# Patient Record
Sex: Female | Born: 1935 | Race: White | Hispanic: No | Marital: Married | Smoking: Current every day smoker
Health system: Southern US, Community
[De-identification: ages and names within clinical notes are randomized; demographics above are authoritative.]

## PROBLEM LIST (undated history)

## (undated) DIAGNOSIS — I700 Atherosclerosis of aorta: Secondary | ICD-10-CM

## (undated) DIAGNOSIS — I491 Atrial premature depolarization: Secondary | ICD-10-CM

## (undated) DIAGNOSIS — I10 Essential (primary) hypertension: Secondary | ICD-10-CM

## (undated) DIAGNOSIS — R220 Localized swelling, mass and lump, head: Secondary | ICD-10-CM

## (undated) DIAGNOSIS — F17200 Nicotine dependence, unspecified, uncomplicated: Secondary | ICD-10-CM

## (undated) DIAGNOSIS — N63 Unspecified lump in unspecified breast: Secondary | ICD-10-CM

## (undated) DIAGNOSIS — R0602 Shortness of breath: Secondary | ICD-10-CM

## (undated) DIAGNOSIS — E119 Type 2 diabetes mellitus without complications: Secondary | ICD-10-CM

## (undated) DIAGNOSIS — Z9981 Dependence on supplemental oxygen: Secondary | ICD-10-CM

## (undated) DIAGNOSIS — J189 Pneumonia, unspecified organism: Secondary | ICD-10-CM

## (undated) DIAGNOSIS — M810 Age-related osteoporosis without current pathological fracture: Secondary | ICD-10-CM

## (undated) DIAGNOSIS — C4402 Squamous cell carcinoma of skin of lip: Secondary | ICD-10-CM

## (undated) DIAGNOSIS — I219 Acute myocardial infarction, unspecified: Secondary | ICD-10-CM

## (undated) DIAGNOSIS — J449 Chronic obstructive pulmonary disease, unspecified: Secondary | ICD-10-CM

## (undated) DIAGNOSIS — M1120 Other chondrocalcinosis, unspecified site: Secondary | ICD-10-CM

## (undated) DIAGNOSIS — J122 Parainfluenza virus pneumonia: Secondary | ICD-10-CM

## (undated) DIAGNOSIS — M1990 Unspecified osteoarthritis, unspecified site: Secondary | ICD-10-CM

## (undated) DIAGNOSIS — T7840XA Allergy, unspecified, initial encounter: Secondary | ICD-10-CM

## (undated) DIAGNOSIS — I2510 Atherosclerotic heart disease of native coronary artery without angina pectoris: Secondary | ICD-10-CM

## (undated) DIAGNOSIS — M47816 Spondylosis without myelopathy or radiculopathy, lumbar region: Secondary | ICD-10-CM

## (undated) DIAGNOSIS — E785 Hyperlipidemia, unspecified: Secondary | ICD-10-CM

## (undated) DIAGNOSIS — N3941 Urge incontinence: Secondary | ICD-10-CM

## (undated) HISTORY — DX: Nicotine dependence, unspecified, uncomplicated: F17.200

## (undated) HISTORY — DX: Atherosclerosis of aorta: I70.0

## (undated) HISTORY — PX: MIDDLE EAR SURGERY: SHX713

## (undated) HISTORY — DX: Parainfluenza virus pneumonia: J12.2

## (undated) HISTORY — DX: Other chondrocalcinosis, unspecified site: M11.20

## (undated) HISTORY — DX: Chronic obstructive pulmonary disease, unspecified: J44.9

## (undated) HISTORY — DX: Atrial premature depolarization: I49.1

## (undated) HISTORY — DX: Essential (primary) hypertension: I10

## (undated) HISTORY — DX: Squamous cell carcinoma of skin of lip: C44.02

## (undated) HISTORY — DX: Unspecified osteoarthritis, unspecified site: M19.90

## (undated) HISTORY — DX: Localized swelling, mass and lump, head: R22.0

## (undated) HISTORY — DX: Hyperlipidemia, unspecified: E78.5

## (undated) HISTORY — DX: Unspecified lump in unspecified breast: N63.0

## (undated) HISTORY — DX: Urge incontinence: N39.41

## (undated) HISTORY — PX: AUGMENTATION MAMMAPLASTY: SUR837

## (undated) HISTORY — DX: Atherosclerotic heart disease of native coronary artery without angina pectoris: I25.10

## (undated) HISTORY — DX: Age-related osteoporosis without current pathological fracture: M81.0

## (undated) HISTORY — DX: Type 2 diabetes mellitus without complications: E11.9

## (undated) HISTORY — DX: Spondylosis without myelopathy or radiculopathy, lumbar region: M47.816

## (undated) HISTORY — DX: Allergy, unspecified, initial encounter: T78.40XA

## (undated) HISTORY — PX: TONSILLECTOMY: SUR1361

---

## 1999-02-23 ENCOUNTER — Other Ambulatory Visit: Admission: RE | Admit: 1999-02-23 | Discharge: 1999-02-23 | Payer: Self-pay | Admitting: Family Medicine

## 1999-04-24 ENCOUNTER — Encounter: Payer: Self-pay | Admitting: Family Medicine

## 1999-04-24 ENCOUNTER — Encounter: Admission: RE | Admit: 1999-04-24 | Discharge: 1999-04-24 | Payer: Self-pay | Admitting: Family Medicine

## 2000-05-03 ENCOUNTER — Other Ambulatory Visit: Admission: RE | Admit: 2000-05-03 | Discharge: 2000-05-03 | Payer: Self-pay | Admitting: Family Medicine

## 2000-05-03 ENCOUNTER — Encounter: Payer: Self-pay | Admitting: Family Medicine

## 2000-05-03 ENCOUNTER — Encounter: Admission: RE | Admit: 2000-05-03 | Discharge: 2000-05-03 | Payer: Self-pay | Admitting: Family Medicine

## 2001-04-17 ENCOUNTER — Encounter: Admission: RE | Admit: 2001-04-17 | Discharge: 2001-04-17 | Payer: Self-pay | Admitting: Family Medicine

## 2001-04-17 ENCOUNTER — Encounter: Payer: Self-pay | Admitting: Family Medicine

## 2001-05-12 ENCOUNTER — Encounter: Payer: Self-pay | Admitting: Family Medicine

## 2001-05-12 ENCOUNTER — Encounter: Admission: RE | Admit: 2001-05-12 | Discharge: 2001-05-12 | Payer: Self-pay | Admitting: Family Medicine

## 2001-05-16 ENCOUNTER — Other Ambulatory Visit: Admission: RE | Admit: 2001-05-16 | Discharge: 2001-05-16 | Payer: Self-pay | Admitting: Family Medicine

## 2001-05-16 ENCOUNTER — Encounter: Payer: Self-pay | Admitting: Family Medicine

## 2001-05-16 ENCOUNTER — Encounter: Admission: RE | Admit: 2001-05-16 | Discharge: 2001-05-16 | Payer: Self-pay | Admitting: Family Medicine

## 2001-10-13 ENCOUNTER — Encounter: Admission: RE | Admit: 2001-10-13 | Discharge: 2001-10-13 | Payer: Self-pay | Admitting: Otolaryngology

## 2001-10-13 ENCOUNTER — Encounter: Payer: Self-pay | Admitting: Otolaryngology

## 2001-10-16 ENCOUNTER — Encounter (INDEPENDENT_AMBULATORY_CARE_PROVIDER_SITE_OTHER): Payer: Self-pay | Admitting: *Deleted

## 2001-10-16 ENCOUNTER — Ambulatory Visit (HOSPITAL_BASED_OUTPATIENT_CLINIC_OR_DEPARTMENT_OTHER): Admission: RE | Admit: 2001-10-16 | Discharge: 2001-10-17 | Payer: Self-pay | Admitting: Otolaryngology

## 2002-02-08 DIAGNOSIS — I219 Acute myocardial infarction, unspecified: Secondary | ICD-10-CM

## 2002-02-08 DIAGNOSIS — I2510 Atherosclerotic heart disease of native coronary artery without angina pectoris: Secondary | ICD-10-CM

## 2002-02-08 HISTORY — PX: CORONARY ANGIOPLASTY WITH STENT PLACEMENT: SHX49

## 2002-02-08 HISTORY — DX: Acute myocardial infarction, unspecified: I21.9

## 2002-02-08 HISTORY — DX: Atherosclerotic heart disease of native coronary artery without angina pectoris: I25.10

## 2002-06-06 ENCOUNTER — Encounter: Admission: RE | Admit: 2002-06-06 | Discharge: 2002-06-06 | Payer: Self-pay | Admitting: Family Medicine

## 2002-06-06 ENCOUNTER — Encounter: Payer: Self-pay | Admitting: Family Medicine

## 2002-06-11 ENCOUNTER — Other Ambulatory Visit: Admission: RE | Admit: 2002-06-11 | Discharge: 2002-06-11 | Payer: Self-pay | Admitting: Family Medicine

## 2002-11-08 ENCOUNTER — Encounter: Payer: Self-pay | Admitting: Emergency Medicine

## 2002-11-08 ENCOUNTER — Inpatient Hospital Stay (HOSPITAL_COMMUNITY): Admission: EM | Admit: 2002-11-08 | Discharge: 2002-11-11 | Payer: Self-pay | Admitting: Emergency Medicine

## 2002-11-26 ENCOUNTER — Encounter (HOSPITAL_COMMUNITY): Admission: RE | Admit: 2002-11-26 | Discharge: 2003-02-24 | Payer: Self-pay | Admitting: Cardiology

## 2003-02-25 ENCOUNTER — Encounter (HOSPITAL_COMMUNITY): Admission: RE | Admit: 2003-02-25 | Discharge: 2003-03-11 | Payer: Self-pay | Admitting: Cardiology

## 2003-06-07 ENCOUNTER — Encounter: Admission: RE | Admit: 2003-06-07 | Discharge: 2003-06-07 | Payer: Self-pay | Admitting: Family Medicine

## 2003-06-27 ENCOUNTER — Other Ambulatory Visit: Admission: RE | Admit: 2003-06-27 | Discharge: 2003-06-27 | Payer: Self-pay | Admitting: Family Medicine

## 2004-07-15 ENCOUNTER — Encounter: Admission: RE | Admit: 2004-07-15 | Discharge: 2004-07-15 | Payer: Self-pay | Admitting: Family Medicine

## 2004-07-21 ENCOUNTER — Other Ambulatory Visit: Admission: RE | Admit: 2004-07-21 | Discharge: 2004-07-21 | Payer: Self-pay | Admitting: Family Medicine

## 2005-01-13 ENCOUNTER — Encounter: Admission: RE | Admit: 2005-01-13 | Discharge: 2005-01-13 | Payer: Self-pay | Admitting: Family Medicine

## 2005-07-23 ENCOUNTER — Other Ambulatory Visit: Admission: RE | Admit: 2005-07-23 | Discharge: 2005-07-23 | Payer: Self-pay | Admitting: Family Medicine

## 2005-07-23 ENCOUNTER — Encounter: Admission: RE | Admit: 2005-07-23 | Discharge: 2005-07-23 | Payer: Self-pay | Admitting: Family Medicine

## 2005-09-08 HISTORY — PX: COLONOSCOPY: SHX174

## 2005-09-08 LAB — HM COLONOSCOPY

## 2006-02-08 DIAGNOSIS — N3941 Urge incontinence: Secondary | ICD-10-CM

## 2006-02-08 HISTORY — DX: Urge incontinence: N39.41

## 2006-08-30 ENCOUNTER — Encounter: Admission: RE | Admit: 2006-08-30 | Discharge: 2006-08-30 | Payer: Self-pay | Admitting: Family Medicine

## 2007-07-31 ENCOUNTER — Encounter: Admission: RE | Admit: 2007-07-31 | Discharge: 2007-07-31 | Payer: Self-pay | Admitting: Family Medicine

## 2007-08-21 ENCOUNTER — Encounter: Admission: RE | Admit: 2007-08-21 | Discharge: 2007-08-21 | Payer: Self-pay | Admitting: Family Medicine

## 2007-08-31 ENCOUNTER — Encounter: Admission: RE | Admit: 2007-08-31 | Discharge: 2007-08-31 | Payer: Self-pay | Admitting: Orthopedic Surgery

## 2007-10-23 ENCOUNTER — Encounter: Admission: RE | Admit: 2007-10-23 | Discharge: 2007-10-23 | Payer: Self-pay | Admitting: Family Medicine

## 2007-11-15 ENCOUNTER — Encounter: Admission: RE | Admit: 2007-11-15 | Discharge: 2007-11-15 | Payer: Self-pay | Admitting: Otolaryngology

## 2007-11-15 ENCOUNTER — Encounter: Payer: Self-pay | Admitting: Family Medicine

## 2007-11-16 ENCOUNTER — Other Ambulatory Visit: Admission: RE | Admit: 2007-11-16 | Discharge: 2007-11-16 | Payer: Self-pay | Admitting: Otolaryngology

## 2007-11-17 ENCOUNTER — Encounter: Payer: Self-pay | Admitting: Family Medicine

## 2007-11-20 ENCOUNTER — Encounter: Payer: Self-pay | Admitting: Family Medicine

## 2007-12-08 ENCOUNTER — Other Ambulatory Visit: Admission: RE | Admit: 2007-12-08 | Discharge: 2007-12-08 | Payer: Self-pay | Admitting: Family Medicine

## 2008-01-22 ENCOUNTER — Encounter: Admission: RE | Admit: 2008-01-22 | Discharge: 2008-01-22 | Payer: Self-pay | Admitting: Family Medicine

## 2008-01-24 ENCOUNTER — Other Ambulatory Visit: Admission: RE | Admit: 2008-01-24 | Discharge: 2008-01-24 | Payer: Self-pay | Admitting: Interventional Radiology

## 2008-01-24 ENCOUNTER — Encounter (INDEPENDENT_AMBULATORY_CARE_PROVIDER_SITE_OTHER): Payer: Self-pay | Admitting: Interventional Radiology

## 2008-01-24 ENCOUNTER — Encounter: Admission: RE | Admit: 2008-01-24 | Discharge: 2008-01-24 | Payer: Self-pay | Admitting: Otolaryngology

## 2008-02-09 DIAGNOSIS — J449 Chronic obstructive pulmonary disease, unspecified: Secondary | ICD-10-CM

## 2008-02-09 HISTORY — DX: Chronic obstructive pulmonary disease, unspecified: J44.9

## 2008-02-09 HISTORY — PX: CATARACT EXTRACTION W/ INTRAOCULAR LENS  IMPLANT, BILATERAL: SHX1307

## 2008-03-11 ENCOUNTER — Encounter (INDEPENDENT_AMBULATORY_CARE_PROVIDER_SITE_OTHER): Payer: Self-pay | Admitting: Otolaryngology

## 2008-03-11 ENCOUNTER — Ambulatory Visit (HOSPITAL_BASED_OUTPATIENT_CLINIC_OR_DEPARTMENT_OTHER): Admission: RE | Admit: 2008-03-11 | Discharge: 2008-03-11 | Payer: Self-pay | Admitting: Otolaryngology

## 2008-06-17 ENCOUNTER — Encounter: Admission: RE | Admit: 2008-06-17 | Discharge: 2008-06-17 | Payer: Self-pay | Admitting: Otolaryngology

## 2008-10-24 ENCOUNTER — Encounter: Admission: RE | Admit: 2008-10-24 | Discharge: 2008-10-24 | Payer: Self-pay | Admitting: Family Medicine

## 2009-09-08 ENCOUNTER — Ambulatory Visit: Payer: Self-pay | Admitting: Cardiology

## 2009-10-27 ENCOUNTER — Encounter: Admission: RE | Admit: 2009-10-27 | Discharge: 2009-10-27 | Payer: Self-pay | Admitting: Family Medicine

## 2009-10-27 LAB — HM MAMMOGRAPHY

## 2009-11-18 ENCOUNTER — Encounter: Payer: Self-pay | Admitting: Family Medicine

## 2009-12-16 ENCOUNTER — Ambulatory Visit: Payer: Self-pay | Admitting: Internal Medicine

## 2009-12-16 DIAGNOSIS — J449 Chronic obstructive pulmonary disease, unspecified: Secondary | ICD-10-CM

## 2009-12-16 DIAGNOSIS — M109 Gout, unspecified: Secondary | ICD-10-CM

## 2009-12-16 DIAGNOSIS — E785 Hyperlipidemia, unspecified: Secondary | ICD-10-CM

## 2009-12-16 DIAGNOSIS — I10 Essential (primary) hypertension: Secondary | ICD-10-CM

## 2009-12-16 DIAGNOSIS — F17210 Nicotine dependence, cigarettes, uncomplicated: Secondary | ICD-10-CM

## 2009-12-16 DIAGNOSIS — Z9849 Cataract extraction status, unspecified eye: Secondary | ICD-10-CM

## 2010-02-24 ENCOUNTER — Encounter: Payer: Self-pay | Admitting: Family Medicine

## 2010-03-01 ENCOUNTER — Encounter: Payer: Self-pay | Admitting: Otolaryngology

## 2010-03-01 ENCOUNTER — Encounter: Payer: Self-pay | Admitting: Family Medicine

## 2010-03-03 ENCOUNTER — Encounter: Payer: Self-pay | Admitting: Family Medicine

## 2010-03-03 ENCOUNTER — Telehealth (INDEPENDENT_AMBULATORY_CARE_PROVIDER_SITE_OTHER): Payer: Self-pay | Admitting: *Deleted

## 2010-03-04 ENCOUNTER — Other Ambulatory Visit: Payer: Self-pay | Admitting: Family Medicine

## 2010-03-04 ENCOUNTER — Ambulatory Visit
Admission: RE | Admit: 2010-03-04 | Discharge: 2010-03-04 | Payer: Self-pay | Source: Home / Self Care | Attending: Family Medicine | Admitting: Family Medicine

## 2010-03-04 LAB — HEPATIC FUNCTION PANEL
ALT: 24 U/L (ref 0–35)
Albumin: 3.3 g/dL — ABNORMAL LOW (ref 3.5–5.2)
Alkaline Phosphatase: 94 U/L (ref 39–117)

## 2010-03-04 LAB — BASIC METABOLIC PANEL
CO2: 34 mEq/L — ABNORMAL HIGH (ref 19–32)
Creatinine, Ser: 0.7 mg/dL (ref 0.4–1.2)
Potassium: 3.5 mEq/L (ref 3.5–5.1)

## 2010-03-04 LAB — B12 AND FOLATE PANEL: Folate: 19.2 ng/mL (ref >5.9)

## 2010-03-10 NOTE — Letter (Signed)
Summary: [facility] Deveshwar gout/pseudogout, OA  [facility]   Imported By: Lanelle Bal 12/31/2009 08:46:36  _____________________________________________________________________  External Attachment:    Type:   Image     Comment:   External Document

## 2010-03-10 NOTE — Letter (Signed)
Summary: [facility] - rec rpt [place] will need to verify done  [facility]   Imported By: Lanelle Bal 12/31/2009 08:47:50  _____________________________________________________________________  External Attachment:    Type:   Image     Comment:   External Document

## 2010-03-10 NOTE — Letter (Signed)
Summary: [facility]  [facility]   Imported By: Lanelle Bal 12/31/2009 08:45:40  _____________________________________________________________________  External Attachment:    Type:   Image     Comment:   External Document

## 2010-03-10 NOTE — Assessment & Plan Note (Signed)
Summary: NEW MEDICARE PT.Marland Kitchen   Vital Signs:  Patient profile:   77 year old female Height:      62 inches Weight:      131 pounds BMI:     24.05 Temp:     98.3 degrees F oral Pulse rate:   76 / minute Pulse rhythm:   regular BP sitting:   120 / 70  (left arm) Cuff size:   regular  Vitals Entered By: Selena Batten Dance CMA Duncan Dull) (December 16, 2009 11:46 AM) CC: New patient to establish care    History of Present Illness: CC: new pt establish  1. smoking - <1 ppd.  40 PY.  Was on chantix in past, gave bad dreams.  Hasn't tried nicotine or wellbutrin in past.  2. ?COPD - now feels breathing fine.  years ago was on inhaler.  Hasn't used in over a year.  maintains a cough.  has had PFTs done last year off [address].  Dr. Artis Flock sent her.  preventative: flu 11/2009 tetanus shot - June 2011 PNA shot 3-4 years ago. Mammogram 10/2009, normal. Pap smear - last 2 years ago.  always been normal.   colonoscopy  ~3 years ago, normal   Current Medications (verified): 1)  Metoprolol Succinate 25 Mg Xr24h-Tab (Metoprolol Succinate) .Marland Kitchen.. 1 By Mouth Once Daily 2)  Triamterene-Hctz 37.5-25 Mg Tabs (Triamterene-Hctz) .Marland Kitchen.. 1 By Mouth Once Daily 3)  Simvastatin 20 Mg Tabs (Simvastatin) .Marland Kitchen.. 1 By Mouth Once Daily 4)  Allopurinol 300 Mg Tabs (Allopurinol) .Marland Kitchen.. 1 By Mouth Once Daily 5)  Aspirin 81 Mg Tabs (Aspirin) .Marland Kitchen.. 1 By Mouth Once Daily 6)  Fish Oil 1000 Mg Caps (Omega-3 Fatty Acids) .... 4 By Mouth Once Daily 7)  Amlodipine Besylate 5 Mg Tabs (Amlodipine Besylate) .Marland Kitchen.. 1 By Mouth Once Daily  Allergies (verified): No Known Drug Allergies  Past History:  Past Medical History: HTN HLD Gout CAD s/p stent 2004 [Jordan] smoking COPD seasonal allergies  Past Surgical History: tonsillectomy 1962 Bilateral ear surgery bone replacement with metal (R 1970s, L 1990s) [**NO MRIs**] breast implants bilateral cataract surgery 2010 Stent placed 2004 [Dr. 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  The patient denies anorexia, fever, weight loss, weight gain, vision loss, decreased hearing, hoarseness, chest pain, syncope, dyspnea on exertion, peripheral edema, headaches, hemoptysis, abdominal pain, melena, hematochezia, severe indigestion/heartburn, hematuria, suspicious skin lesions, transient blindness, difficulty walking, depression, and breast masses.         hearing aids are in. feels rested in am.  Physical Exam  General:  Well-developed,well-nourished,in no acute distress; alert,appropriate and cooperative throughout examination Head:  Normocephalic and atraumatic without obvious abnormalities. No apparent alopecia or balding. Eyes:  No corneal or conjunctival inflammation noted. EOMI. Perrla. Ears:  External ear exam shows no significant lesions or deformities.  Otoscopic examination reveals clear canals, tympanic membranes are intact bilaterally without bulging, retraction, inflammation or discharge. Hearing is grossly normal bilaterally. Nose:  External nasal examination shows no deformity or inflammation. Nasal mucosa are pink and moist without lesions or exudates. Mouth:  Oral mucosa and oropharynx without lesions or exudates.  Teeth in good repair. Neck:  No deformities, masses, or tenderness noted. Lungs:  coarse breath sounds, + bibasilar  harsh Heart:  Normal rate and regular rhythm. S1 and S2 normal without gallop, murmur, click, rub or other extra sounds. Abdomen:  Bowel sounds positive,abdomen soft and non-tender without masses, organomegaly or hernias noted. Msk:  No deformity or scoliosis noted of thoracic or lumbar spine.   Pulses:  2+ rad pulses Extremities:  no pedal edema Neurologic:  CN grossly intact, station  and gait intact Skin:  + mult AKs BUE Psych:  full affect, pleasant and cooperative with exam   Impression & Recommendations:  Problem # 1:  TOBACCO ABUSE (ICD-305.1)  Encouraged smoking cessation.  contemplative.  not in action yet.  to let us know when she is.  reviewed previous failed attempts.  Orders: Tobacco use cessation intermediate 3-10 minutes (16109)  Problem # 2:  COPD, MILD (ICD-496) will review records.  if no recent PFTs, consider sending.  Problem # 3:  CAD S/P STENT 2004 [JORDAN] (ICD-414.00) stable on current meds.  s/p stent 2004 by Dr. [place]. Her updated medication list for this problem includes:    Metoprolol Succinate 25 Mg Xr24h-tab (Metoprolol succinate) .Marland Kitchen... 1 by mouth once daily    Triamterene-hctz 37.5-25 Mg Tabs (Triamterene-hctz) .Marland Kitchen... 1 by mouth once daily    Aspirin 81 Mg Tabs (Aspirin) .Marland Kitchen... 1 by mouth once daily    Amlodipine Besylate 5 Mg Tabs (Amlodipine besylate) .Marland Kitchen... 1 by mouth once daily  Problem # 4:  HYPERLIPIDEMIA (ICD-272.4) review records.  check FLP next visit.  Her updated medication list for this problem includes:    Simvastatin 20 Mg Tabs (Simvastatin) .Marland Kitchen... 1 by mouth once daily  Problem # 5:  HYPERTENSION (ICD-401.9) good control on current meds.  Her updated medication list for this problem includes:    Metoprolol Succinate 25 Mg Xr24h-tab (Metoprolol succinate) .Marland Kitchen... 1 by mouth once daily    Triamterene-hctz 37.5-25 Mg Tabs (Triamterene-hctz) .Marland Kitchen... 1 by mouth once daily    Amlodipine Besylate 5 Mg Tabs (Amlodipine besylate) .Marland Kitchen... 1 by mouth once daily  BP today: 120/70  Problem # 6:  GOUT, UNSPECIFIED (ICD-274.9) continue allopurinol.  consider checking UA. Her updated medication list for this problem includes:    Allopurinol 300 Mg Tabs (Allopurinol) .Marland Kitchen... 1 by mouth once daily  Complete Medication List: 1)  Metoprolol Succinate 25 Mg Xr24h-tab (Metoprolol succinate) .Marland Kitchen.. 1 by mouth once daily 2)  Triamterene-hctz  37.5-25 Mg Tabs (Triamterene-hctz) .Marland Kitchen.. 1 by mouth once daily 3)  Simvastatin 20 Mg Tabs (Simvastatin) .Marland Kitchen.. 1 by mouth once daily 4)  Allopurinol 300 Mg Tabs (Allopurinol) .Marland Kitchen.. 1 by mouth once daily 5)  Aspirin 81 Mg Tabs (Aspirin) .Marland Kitchen.. 1 by mouth once daily 6)  Fish Oil 1000 Mg Caps (Omega-3 fatty acids) .... 4 by mouth once daily 7)  Amlodipine Besylate 5 Mg Tabs (Amlodipine besylate) .Marland Kitchen.. 1 by mouth once daily  Patient Instructions: 1)  Return for CPE in then next few months. 2)  Keep cutting back on smoking.  let us know if we can help. 3)  Wear sunscreen when going out. 4)  Pleasure to see you today, call clinic with questions.   Orders Added: 1)  Tobacco use cessation intermediate 3-10 minutes [99406] 2)  New Patient Level III [60454]    Current Allergies (reviewed today): No known allergies   Appended Document: NEW MEDICARE PT..    -  Date:  11/12/2009    Flu vaccine given  Date:  10/27/2009    Mammogram BiRads1  Date:  10/24/2008    Mammogram Resa Miner  Date:  12/11/2007    PAP normal  Date:  10/23/2007    Mammogram BiRads1  Date:  08/30/2006    Mammogram Birads1  Date:  07/23/2005    PAP normal   Past History:  Past Medical History: HTN HLD Diabetes? A1c 6.8% CAD s/p stent 2004 [Jordan] smoking OA gout/pseudogout [Deveshwar/Landau] COPD/asthma - not on meds?     FEV1 39%, FVC 49%, ratio 0.59, not reversible w SABA (05/2008) seasonal allergies urge incontinence s/p urodynamics 2008 [Dahlstedt]  Past Surgical History: tonsillectomy 1962 B ear surgery bone replacement with metal (R 1970s, L 1990s) [**NO MRIs**] otosclerosis s/p stapedectomy breast implants bilateral cataract surgery [Groat] 2010 Colnoscopy small int/ext hemorrhoids, normal, rpt 5-10 years [Magod] 09/2005  Stent placed 2004 [Dr. Jordan]           normal stress cardiolite 2010 h/o L thyroid nodule - colloid cyst [Wolicki] CT L spine - mild multilevel spondylosis w/  scoliosis, mild central stenosis L3-4           L4/5, L5/S1 epidural injections x3 (2009)    Allergies: No Known Drug Allergies

## 2010-03-10 NOTE — Letter (Signed)
Summary: [facility] - acute bleed into thyroid cyst [facility]   Imported By: Lanelle Bal 12/31/2009 08:48:38  _____________________________________________________________________  External Attachment:    Type:   Image     Comment:   External Document

## 2010-03-12 ENCOUNTER — Ambulatory Visit: Admit: 2010-03-12 | Payer: Self-pay | Admitting: Family Medicine

## 2010-03-12 ENCOUNTER — Other Ambulatory Visit: Payer: Self-pay | Admitting: Family Medicine

## 2010-03-12 ENCOUNTER — Encounter (INDEPENDENT_AMBULATORY_CARE_PROVIDER_SITE_OTHER): Payer: MEDICARE | Admitting: Family Medicine

## 2010-03-12 ENCOUNTER — Encounter: Payer: Self-pay | Admitting: Family Medicine

## 2010-03-12 DIAGNOSIS — R7309 Other abnormal glucose: Secondary | ICD-10-CM

## 2010-03-12 DIAGNOSIS — E118 Type 2 diabetes mellitus with unspecified complications: Secondary | ICD-10-CM | POA: Insufficient documentation

## 2010-03-12 DIAGNOSIS — N63 Unspecified lump in unspecified breast: Secondary | ICD-10-CM | POA: Insufficient documentation

## 2010-03-12 DIAGNOSIS — Z01419 Encounter for gynecological examination (general) (routine) without abnormal findings: Secondary | ICD-10-CM

## 2010-03-12 HISTORY — DX: Unspecified lump in unspecified breast: N63.0

## 2010-03-12 LAB — CONVERTED CEMR LAB
ALT: 20 units/L
Albumin: 4.3 g/dL
Alkaline Phosphatase: 98 units/L
BUN: 12 mg/dL
CO2: 28 meq/L
Glucose, Bld: 129 mg/dL
HCT: 49.1 %
Hemoglobin: 16.4 g/dL
MCV: 101.9 fL
RBC: 4.82 M/uL
Sodium: 142 meq/L
Uric Acid, Serum: 3.8 mg/dL
WBC: 9.3 10*3/uL

## 2010-03-12 LAB — HEMOGLOBIN A1C: Hgb A1c MFr Bld: 6.7 % — ABNORMAL HIGH (ref 4.6–6.5)

## 2010-03-12 NOTE — Miscellaneous (Signed)
Summary: Labs from 02-24-10   Clinical Lists Changes  Observations: Added new observation of PLATELETK/UL: 199 K/uL (02/24/2010 13:29) Added new observation of MCV: 101.9 fL (02/24/2010 13:29) Added new observation of HCT: 49.1 % (02/24/2010 13:29) Added new observation of HGB: 16.4 g/dL (16/11/9602 54:09) Added new observation of RBC M/UL: 4.82 M/uL (02/24/2010 13:29) Added new observation of WBC COUNT: 9.3 10*3/microliter (02/24/2010 13:29) Added new observation of URIC ACID: 3.8 mg/dL (81/19/1478 29:56) Added new observation of CALCIUM: 9.7 mg/dL (21/30/8657 84:69) Added new observation of ALBUMIN: 4.3 g/dL (62/95/2841 32:44) Added new observation of PROTEIN, TOT: 7.1 g/dL (02/10/7251 66:44) Added new observation of SGPT (ALT): 20 units/L (02/24/2010 13:29) Added new observation of SGOT (AST): 23 units/L (02/24/2010 13:29) Added new observation of ALK PHOS: 98 units/L (02/24/2010 13:29) Added new observation of BILI TOTAL: 0.9 mg/dL (03/47/4259 56:38) Added new observation of CREATININE: 0.73 mg/dL (75/64/3329 51:88) Added new observation of BUN: 12 mg/dL (41/66/0630 16:01) Added new observation of BG RANDOM: 129 mg/dL (09/32/3557 32:20) Added new observation of CO2 PLSM/SER: 28 meq/L (02/24/2010 13:29) Added new observation of CL SERUM: 101 meq/L (02/24/2010 13:29) Added new observation of K SERUM: 3.8 meq/L (02/24/2010 13:29) Added new observation of NA: 142 meq/L (02/24/2010 13:29)

## 2010-03-12 NOTE — Progress Notes (Signed)
----   Converted from flag ---- ---- 03/03/2010 12:48 AM, Eustaquio Boyden  MD wrote: CMP, CBC, uric acid 274.9, 272.4, 401.9  ---- 02/27/2010 11:40 AM, Liane Comber CMA (AAMA) wrote: Lab orders please! Good Morning! This pt is scheduled for cpx labs Wed, which labs to draw and dx codes to use? Thanks Tasha ------------------------------  Appended Document:  was able to review records from rheum - please cancel CBC, UA but add on folate, B12 (289.89) Eustaquio Boyden  MD  March 03, 2010 12:19 PM   Clinical Lists Changes  Problems: Added new problem of OTHER SPEC DISEASES BLOOD&BLOOD-FORMING ORGANS (ICD-289.89)

## 2010-03-18 ENCOUNTER — Ambulatory Visit (INDEPENDENT_AMBULATORY_CARE_PROVIDER_SITE_OTHER): Payer: MEDICARE | Admitting: Cardiology

## 2010-03-18 DIAGNOSIS — F17200 Nicotine dependence, unspecified, uncomplicated: Secondary | ICD-10-CM

## 2010-03-18 DIAGNOSIS — I10 Essential (primary) hypertension: Secondary | ICD-10-CM

## 2010-03-18 DIAGNOSIS — E785 Hyperlipidemia, unspecified: Secondary | ICD-10-CM

## 2010-03-18 DIAGNOSIS — I2510 Atherosclerotic heart disease of native coronary artery without angina pectoris: Secondary | ICD-10-CM

## 2010-03-18 NOTE — Assessment & Plan Note (Signed)
Summary: cpe/pap DLO   Vital Signs:  Patient profile:   77 year old female Height:      62 inches Weight:      128.25 pounds Temp:     98.1 degrees F oral Pulse rate:   84 / minute Pulse rhythm:   regular BP sitting:   142 / 80  (left arm) Cuff size:   regular  Vitals Entered By: Selena Batten Dance CMA (AAMA) (March 12, 2010 11:21 AM) CC: CPx   History of Present Illness: CC: CPE and issues/questions  brings in questions about insurance, cheaper to do metprolol tartrate, currently taking succinate.  asks for change.  nerve pills? - would like nerve pill.  sometimes worires about things like being left out of mother's will 10+ years ago.  this has led to some family stress.  sometimes trouble going to sleep 2/2 thinking about these things.  + hot flashes at night.  would like medicine to help cope with things.  Feels affecting ability to function and sleep.  smoking - just under 1 ppd.  not ready to quit  hyperglycemia - sugar fasting high at 148.  check A1c today.  never told may have diabetes in past.  no polydipsia,uria  States has known L medial breast lumps, s/p ultrasound and told scar tissue from implants.  no records of mammo's recently, will request.  preventative: flu 11/2009 tetanus shot - June 2011 PNA shot 3-4 years ago. Mammogram 10/2009, normal, gets yearly. Pap smear - last 2 years ago.  always been normal. colonoscopy  ~3 years ago, normal  Current Medications (verified): 1)  Metoprolol Tartrate 25 Mg Tabs (Metoprolol Tartrate) .... Take 1/2 in Am and 1/2 in Pm For Blood Pressure 2)  Triamterene-Hctz 37.5-25 Mg Tabs (Triamterene-Hctz) .Marland Kitchen.. 1 By Mouth Once Daily 3)  Simvastatin 20 Mg Tabs (Simvastatin) .Marland Kitchen.. 1 By Mouth Once Daily 4)  Allopurinol 300 Mg Tabs (Allopurinol) .Marland Kitchen.. 1 By Mouth Once Daily 5)  Aspirin 81 Mg Tabs (Aspirin) .Marland Kitchen.. 1 By Mouth Once Daily 6)  Fish Oil 1000 Mg Caps (Omega-3 Fatty Acids) .... 4 By Mouth Once Daily 7)  Amlodipine Besylate 5 Mg Tabs  (Amlodipine Besylate) .Marland Kitchen.. 1 By Mouth Once Daily  Allergies (verified): No Known Drug Allergies  Past History:  Past Surgical History: Last updated: 12/16/2009 tonsillectomy 1962 B ear surgery bone replacement with metal (R 1970s, L 1990s) [**NO MRIs**] otosclerosis s/p stapedectomy breast implants bilateral cataract surgery [Groat] 2010 Colnoscopy small int/ext hemorrhoids, normal, rpt 5-10 years [Magod] 09/2005  Stent placed 2004 [Dr. 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     FEV1 39%, FVC 49%, ratio 0.59, not reversible w SABA (05/2008) seasonal allergies urge incontinence s/p urodynamics 2008 [Dahlstedt] known L breast lumps, told scar tissue from implants  Review of Systems  The patient denies anorexia, fever, weight loss, weight gain, vision loss, decreased hearing, hoarseness, chest pain, syncope, dyspnea on exertion, peripheral  edema, prolonged cough, headaches, hemoptysis, abdominal pain, melena, hematochezia, severe indigestion/heartburn, hematuria, depression, and breast masses.         No BM chnages, no blood in stool/urine.    Physical Exam  General:  Well-developed,well-nourished,in no acute distress; alert,appropriate and cooperative throughout examination Head:  Normocephalic and atraumatic without obvious abnormalities. No apparent alopecia or balding. Eyes:  No corneal or conjunctival inflammation noted. EOMI. Perrla. Mouth:  Oral mucosa  and oropharynx without lesions or exudates.  Teeth in good repair. Neck:  No deformities, masses, or tenderness noted. Breasts:  No mass, thickening, tenderness, bulging, retraction, inflamation, nipple discharge or skin changes noted.   L breast with 2 discrete nodules medial inferior quadrant, per patient stable for years and s/p [place] told scar tissue Lungs:  coarse breath sounds, + bibasilar harsh Heart:  Normal rate and regular rhythm. S1 and S2 normal without gallop, murmur, click, rub or other extra sounds. Genitalia:  Pelvic Exam:        External: normal female genitalia without lesions or masses        Vagina: normal without lesions or masses, low urethra no cystocele        Cervix: normal without lesions or masses        Adnexa: normal bimanual exam without masses or fullness        Uterus: normal by palpation        Pap smear: not performed Pulses:  2+ rad pulses Extremities:  no pedal edema Skin:  + mult AKs BUE   Impression & Recommendations:  Problem # 1:  HYPERGLYCEMIA (ICD-790.29) check A1c again.  last visit 6.8% 10/2009.  discussed healthy eating, possiblity of starting metofrmin, discussed gi upset if started.  Orders: Venipuncture (16109) TLB-A1C / Hgb A1C (Glycohemoglobin) (83036-A1C)  Labs Reviewed: Creat: 0.7 (03/04/2010)     Problem # 2:  ROUTINE GYNECOLOGICAL EXAMINATION (ICD-V72.31)  no abnormalities identified.  no pap needed as last 3 years ago, all normal.  Orders: Pelvic & Breast Exam ( Medicare)  (G0101)  Problem # 3:  OTHER SPEC DISEASES BLOOD&BLOOD-FORMING ORGANS (ICD-289.89) rec start MVI for B12 component.  B12 low normal at 280.  macrocytosis but no anemia (likely smoking contributing to obfuscation)  Problem # 4:  TOBACCO ABUSE (ICD-305.1) encouraged cessation.  Problem # 5:  UNSPECIFIED FAMILY CIRCUMSTANCE (ICD-V61.9) stressor from previous event when mother died and being left out of will.  recently increased anxiety from this.  pt  invested in benzo currently.  dsicussed pros and cons including fall risk, worsening mentation risk, dependence/addiction risk.  pt desires to do trial of benzo.  will start ativan low dose.  discussed how if will need for longer than 3 mo, will want to reassess and come up with better long term management strategy (ssri, counseling).    Problem # 6:  HEALTH MAINTENANCE EXAM (ICD-V70.0) immunizations updated and reviewed.  discussed healthy eating.  Problem # 7:  BREAST MASS, BENIGN (ICD-611.72) per patient stable, has had [place] which showed it to be scar tissue from previous implants.  Complete Medication List: 1)  Metoprolol Tartrate 25 Mg Tabs (Metoprolol tartrate) .... Take 1/2 in am and 1/2 in pm for blood pressure 2)  Triamterene-hctz 37.5-25 Mg Tabs (Triamterene-hctz) .Marland Kitchen.. 1 by mouth once daily 3)  Simvastatin 20 Mg Tabs (Simvastatin) .Marland Kitchen.. 1 by mouth once daily 4)  Allopurinol 300 Mg Tabs (Allopurinol) .Marland Kitchen.. 1 by mouth once daily 5)  Aspirin 81 Mg Tabs (Aspirin) .Marland Kitchen.. 1 by mouth once daily  6)  Fish Oil 1000 Mg Caps (Omega-3 fatty acids) .... 4 by mouth once daily 7)  Amlodipine Besylate 5 Mg Tabs (Amlodipine besylate) .Marland Kitchen.. 1 by mouth once daily 8)  Ativan 1 Mg Tabs (Lorazepam) .... Take 1/2 to one daily as needed  Patient Instructions: 1)  Start taking a multivitamin daily for the B12 component. 2)  Keep thinking about cutting back smoking. 3)  Ativan trial - caution it can increase risk of falls and make you sleepy.  This will be a temporary measure.  If needing for more than a few months, we will need to talk about better long term treatment or possible counseling. 4)  Breast exam and pelvic exam today. 5)  we will checkA1c today - measure of sugars in last 3 months to see if you have diabetes.  If diabetes, we will want to start a new medicine called metformin. 6)  Let us know if any problems. Prescriptions: METOPROLOL TARTRATE 25 MG TABS (METOPROLOL TARTRATE) take 1/2 in am and 1/2 in  pm for blood pressure  #90 x 3   Entered and Authorized by:   Eustaquio Boyden  MD   Signed by:   Eustaquio Boyden  MD on 03/12/2010   Method used:   Print then Give to Patient   RxID:   [id number] ATIVAN 1 MG TABS (LORAZEPAM) take 1/2 to one daily as needed  #20 x 0   Entered and Authorized by:   Eustaquio Boyden  MD   Signed by:   Eustaquio Boyden  MD on 03/12/2010   Method used:   Print then Give to Patient   RxID:   [id number] METOPROLOL TARTRATE 25 MG TABS (METOPROLOL TARTRATE) take 1/2 in am and 1/2 in pm for blood pressure  #45 x 3   Entered and Authorized by:   Eustaquio Boyden  MD   Signed by:   Eustaquio Boyden  MD on 03/12/2010   Method used:   Print then Give to Patient   RxID:   [id number]    Orders Added: 1)  Venipuncture [44034] 2)  TLB-A1C / Hgb A1C (Glycohemoglobin) [83036-A1C] 3)  Pelvic & Breast Exam ( Medicare)  [G0101] 4)  Est. Patient Level IV [74259]    Current Allergies (reviewed today): No known allergies

## 2010-03-24 ENCOUNTER — Encounter: Payer: Self-pay | Admitting: Family Medicine

## 2010-04-01 NOTE — Miscellaneous (Signed)
Summary: glucommeter question  Clinical Lists Changes saw husba nd today.  pt requests glucommeter for dx DM. Eustaquio Boyden  MD  March 24, 2010 8:37 AM  Medications: Added new medication of ONETOUCH ULTRA SYSTEM W/DEVICE KIT (BLOOD GLUCOSE MONITORING SUPPL) or equivalent glucommeter dx 250.00 - Signed Added new medication of ONETOUCH TEST  STRP (GLUCOSE BLOOD) check daily dx 250.00 qs 1 month - Signed Added new medication of ONETOUCH ULTRASOFT LANCETS  MISC (LANCETS) check sugars daily 250.00 qs 1 mo - Signed Rx of ONETOUCH ULTRA SYSTEM W/DEVICE KIT (BLOOD GLUCOSE MONITORING SUPPL) or equivalent glucommeter dx 250.00;  #1 x 0;  Signed;  Entered by: Eustaquio Boyden  MD;  Authorized by: Eustaquio Boyden  MD;  Method used: Print then Give to Patient Rx of ONETOUCH TEST  STRP (GLUCOSE BLOOD) check daily dx 250.00 qs 1 month;  #1 x 11;  Signed;  Entered by: Eustaquio Boyden  MD;  Authorized by: Eustaquio Boyden  MD;  Method used: Print then Give to Patient Rx of ONETOUCH ULTRASOFT LANCETS  MISC (LANCETS) check sugars daily 250.00 qs 1 mo;  #1 x 11;  Signed;  Entered by: Eustaquio Boyden  MD;  Authorized by: Eustaquio Boyden  MD;  Method used: Print then Give to Patient    Prescriptions: ONETOUCH ULTRASOFT LANCETS  MISC (LANCETS) check sugars daily 250.00 qs 1 mo  #1 x 11   Entered and Authorized by:   Eustaquio Boyden  MD   Signed by:   Eustaquio Boyden  MD on 03/24/2010   Method used:   Print then Give to Patient   RxID:   [id number] ONETOUCH TEST  STRP (GLUCOSE BLOOD) check daily dx 250.00 qs 1 month  #1 x 11   Entered and Authorized by:   Eustaquio Boyden  MD   Signed by:   Eustaquio Boyden  MD on 03/24/2010   Method used:   Print then Give to Patient   RxID:   [id number] [facility] ULTRA SYSTEM W/DEVICE KIT (BLOOD GLUCOSE MONITORING SUPPL) or equivalent glucommeter dx 250.00  #1 x 0   Entered and Authorized by:   Eustaquio Boyden  MD   Signed by:   Eustaquio Boyden  MD on  03/24/2010   Method used:   Print then Give to Patient   RxID:   [id number]

## 2010-05-14 ENCOUNTER — Other Ambulatory Visit: Payer: Self-pay | Admitting: *Deleted

## 2010-05-14 MED ORDER — LORAZEPAM 0.5 MG PO TABS: ORAL_TABLET | ORAL | Status: DC

## 2010-05-14 NOTE — Telephone Encounter (Signed)
Filled.  plz phone in and notify patient.

## 2010-05-14 NOTE — Telephone Encounter (Signed)
Rx called in as directed and patient aware.  

## 2010-05-19 ENCOUNTER — Encounter: Payer: Self-pay | Admitting: Family Medicine

## 2010-05-20 ENCOUNTER — Ambulatory Visit (INDEPENDENT_AMBULATORY_CARE_PROVIDER_SITE_OTHER): Payer: Self-pay | Admitting: Family Medicine

## 2010-05-20 ENCOUNTER — Encounter: Payer: Self-pay | Admitting: Family Medicine

## 2010-05-20 DIAGNOSIS — J449 Chronic obstructive pulmonary disease, unspecified: Secondary | ICD-10-CM

## 2010-05-20 DIAGNOSIS — J4489 Other specified chronic obstructive pulmonary disease: Secondary | ICD-10-CM

## 2010-05-20 MED ORDER — PREDNISONE 20 MG PO TABS: ORAL_TABLET | ORAL | Status: DC

## 2010-05-20 MED ORDER — AZITHROMYCIN 250 MG PO TABS: ORAL_TABLET | ORAL | Status: DC

## 2010-05-20 NOTE — Progress Notes (Signed)
77 year old female pleasant with a history of tobacco abuse, current smoker, chronic obstructive pulmonary disease, who presents with a one-week history of cough, shortness of breath, wheezing, sinus congestion. She is having some labored breathing and shortness of breath as well as sputum production.  Sinus problems and aching a lot Not feeling good since saturday Feeling achy some occ tylenol  Coughing also Smokes - < 1 ppd Coughing and blowing her nose is bothering her the most. Pain behind her eyes, as well as wheezing and shortness of breath.  The PMH, PSH, Social History, Family History, Medications, and allergies have been reviewed in [facility], and have been updated if relevant.  GEN: A and O x 3. WDWN. NAD.    ENT: Nose clear, ext NML.  No LAD.  No JVD.  TM's clear. Oropharynx clear.  PULM: Normal WOB, no distress. No crackles, Diffuse scattered wheezes, as well as decreased air movement CV: RRR, no M/G/R, No rubs, No JVD.   EXT: warm and well-perfused, No c/c/e. PSYCH: Pleasant and conversant.

## 2010-05-20 NOTE — Patient Instructions (Signed)
  Fever, cough, chest pain, shortness of breath, phlegm production, fatigue are symptoms.  Treatment: 1. Take all medicines 2. Antibiotics  3. Cough suppressants 4. Bronchodilators: an inhaler 5. Expectorant like Guaifenesin (Robitussin, Mucinex)  Fluids and Moisture help: drink lots of fluids Vaporizier or humidifier in room, shower steam --help loosen secretions and sooth breathing passages  Elevate head slightly when trying to sleep.

## 2010-05-25 LAB — BASIC METABOLIC PANEL
Creatinine, Ser: 0.73 mg/dL (ref 0.4–1.2)
Glucose, Bld: 130 mg/dL — ABNORMAL HIGH (ref 70–99)

## 2010-05-26 LAB — POCT HEMOGLOBIN-HEMACUE: Hemoglobin: 14.6 g/dL (ref 12.0–15.0)

## 2010-06-12 ENCOUNTER — Encounter: Payer: Self-pay | Admitting: Family Medicine

## 2010-06-18 ENCOUNTER — Other Ambulatory Visit: Payer: Self-pay | Admitting: *Deleted

## 2010-06-19 ENCOUNTER — Telehealth: Payer: Self-pay | Admitting: *Deleted

## 2010-06-19 MED ORDER — TRIAMTERENE-HCTZ 37.5-25 MG PO CAPS: 1.0000 | ORAL_CAPSULE | Freq: Every day | ORAL | Status: DC

## 2010-06-19 MED ORDER — SIMVASTATIN 20 MG PO TABS: 20.0000 mg | ORAL_TABLET | Freq: Every day | ORAL | Status: DC

## 2010-06-19 MED ORDER — AMLODIPINE BESYLATE 5 MG PO TABS: 5.0000 mg | ORAL_TABLET | Freq: Every day | ORAL | Status: DC

## 2010-06-19 MED ORDER — ALLOPURINOL 300 MG PO TABS: 300.0000 mg | ORAL_TABLET | Freq: Every day | ORAL | Status: DC

## 2010-06-19 NOTE — Telephone Encounter (Signed)
Patient is requesting a call from [facility] regarding her refills.

## 2010-06-19 NOTE — Telephone Encounter (Signed)
Patient notified and Rx placed up front for pick up. 

## 2010-06-19 NOTE — Telephone Encounter (Signed)
Patient requesting written scripts for allopurinol, amlodipine, simvastatin and triamterene. She is starting mail order and wants to pick them up today. She is calling [facility] to cancel the refills that were sent in yesterday.

## 2010-06-19 NOTE — Telephone Encounter (Signed)
Ok to do.  Call and notify pt ready to pick up

## 2010-06-19 NOTE — Telephone Encounter (Signed)
Sent in

## 2010-06-23 NOTE — Op Note (Signed)
NAMEALESI, ZACHERY NO.:  [id number]   MEDICAL RECORD NO.:  [id number]          PATIENT TYPE:  AMB   LOCATION:  DSC                          FACILITY:  [facility]   PHYSICIAN:  Karol T. Lazarus Salines, M.D. 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   POSTOPERATIVE DIAGNOSIS:  Left laryngeal ventricular cyst.   PROCEDURE PERFORMED:  Microdirect laryngoscopy and  excision/marsupialization, left laryngeal ventricular cyst.   SURGEON:  Karol T. Wolicki, MD   ANESTHESIA:  General orotracheal.   BLOOD LOSS:  Minimal.   COMPLICATIONS:  None.   FINDINGS:  A smooth purplish 1 cm cyst involving the inferior free edge  of the false vocal cord and tucked down into the laryngeal ventricle  containing a slightly cloudy thick mucous internally.  Upon removing the  dome of the cyst sharply, no obvious lesion identified in the ventricle.   PROCEDURE:  With the patient in a comfortable supine position, general  orotracheal anesthesia was induced without difficulty.  At an  appropriate level, the table was turned 90 degrees and the patient  placed in a slight reverse Trendelenburg.  A clean preparation and  draping was accomplished.   A rubber tooth guard was used.  The laser anterior commissure  laryngoscope was introduced and poised in the larynx with good  visualization of the glottis.  The findings were as described above.  A  photograph was taken.  1:1000 epinephrine solution was placed in a  cotton pledget and applied over the mucosa of the cyst for topical  hemostasis.  Several minutes were allowed for this to take effect.   The cottonoid was removed.  The ventricle was probed with the suction  tip with no obvious findings.  Using a cup forceps for distraction and  up micro scissors, the mucosa at the free edge  of the false vocal cord  was incised and dissection was carried downward.  The scissors were not  working very well.  A sickle knife was used to continue the dissection.  The cyst cavity was entered and small amount of slightly cloudy mucus  was evacuated.   At this point using the laser was elected.  The endotracheal tube was  exchanged for a laser endotracheal tube using a Jackson laryngoscope  without difficulty.  The patient had saline saturated eye pads in place  and the entire field was surrounded with saline saturated towels.  All  personnel on the room had protective eye gear.  The laser had been  previously tested for aim and function.   Upon preparing to actually resect the lesion, the laser would not fire.  This portion of the procedure was abandoned.   Working once again down the laryngoscope, using a sharp technique, the  cyst roof was carefully resected.  This allowed good visualization into  the ventricle with no obvious lesions.  Several biopsies were taken with  cup forceps deep into the ventricle to rule out  an obstructing  malignancy.  No significant findings were noted.  Hemostasis was  observed.  Another pledget with 1:1000 epinephrine was placed against  this raw surface for several minutes.  Upon removal, photographs were  taken once again.  At this point, the procedure was completed.  The  laryngoscope was unsuspended and carefully removed.  Note that the  laryngoscope had been unsuspended at several prior poise during the case  to allow the tongue to revascularize.  The rubber tooth guard was  removed.  The dental status was intact.  The patient was returned to  Anesthesia, awakened, extubated, and transferred to recovery in stable  condition.   COMMENT:  A 77 year old white female with a heavy smoking history was  observed incidentally in my office several months ago to have a  laryngeal ventricular cyst.  In the meantime, she has been treated for  an acute  thyroid tender swelling which was felt to be bleeding in to  assist and has received a proper cardiac clearance per Dr. Swaziland and  today's procedure was thus undertaken.  We will await the pathology  report.  Emphasize smoking cessation.  Otherwise, emphasize ice,  elevation, analgesia, soft diet, and voice rest.  Given low anticipated  risk of postanesthetic or postsurgical complications, feel an outpatient  venue is appropriate.      Gloris Manchester. Lazarus Salines, M.D.  Electronically Signed     KTW/MEDQ  D:  03/11/2008  T:  03/11/2008  Job:  [id number]   cc:   Quita Skye. Artis Flock, M.D.

## 2010-06-26 NOTE — Discharge Summary (Signed)
NAME:  Desiree Floyd, Desiree Floyd NO.:  [id number]   MEDICAL RECORD NO.:  [id number]                   PATIENT TYPE:  INP   LOCATION:  3705                                 FACILITY:  [facility]   PHYSICIAN:  Peter M. Swaziland, M.D.               DATE OF BIRTH:  1932/03/16   DATE OF ADMISSION:  11/08/2002  DATE OF DISCHARGE:  11/11/2002                                 DISCHARGE SUMMARY   HISTORY OF PRESENT ILLNESS:  Ms. Brinton is a 77 year old white female with  a history of hypertension, tobacco abuse, and hypercholesterolemia who  presented with acute onset of substernal chest pain.  This was left  precordial pain radiating to her left axilla and left arm.  Associated with  diaphoresis.  ECG on presentation showed hyperacute ST elevation in leads V2  through V4 consistent with acute myocardial infarction.  For details of her  past medical history, social history, and family history, please see  admission history and physical.   LABORATORY DATA:  Initial ECG, as noted, showed normal sinus rhythm with  acute ST elevation in leads V2 through V4.   Chest x-ray showed probable COPD without active disease.   The pH was 7.37, pCO2 of 44, bicarbonate of 26.  White count was 10,900,  hemoglobin 13.4, hematocrit 38.9, and platelets 222,000.  Coagulations were  normal.  Sodium 140, potassium 3.6, chloride 104, CO2 26, BUN 11, creatinine  0.7, glucose of 151.  Hemoglobin A1C was 6.3%.  CK was 188 with 33.6 MB.  Follow-up CPK was 360 with 56.5 MB.  Cholesterol was 132, triglycerides of  363, HDL 33, LDL of 26.   INITIAL PLAN OF CARE:  Cardiac enzymes were negative.   HOSPITAL COURSE:  The patient was admitted with acute myocardial infarction.  She was treated initially with IV nitroglycerin, heparin, IV beta blocker,  and aspirin.  She was taken emergently to the cardiac catheterization lab  where coronary angiography demonstrated a 90-95% stenosis in the mid left  circumflex coronary artery.  There was a 70-80% proximal right coronary  stenosis with 50% disease in the distal right coronary artery.  Left  ventricular contractility was normal.  Ejection fraction estimated at 70%.  We proceeded at this point with stenting of the mid circumflex which  appeared to be the culprit lesion.  This was initially dilated using a 2.5 x  1 mm balloon and then stented using a 2.5 x 13 mm CYPHER drug-eluting stent.  An excellent result was obtained with 0% residual stenosis.  The patient's  pain resolved at this point.  Her ECG showed dramatic improvement in the ST  elevation noted previously.  The patient was subsequently admitted to  telemetry.  She was seen by smoking cessation counselor and reported that  she did not want to quit smoking and was not interested in any smoking  cessation strategies.  She was seen by  cardiac rehabilitation and  progressively ambulated.  She had adjustments in her medications for better  blood pressure control.  She had no further recurrent angina.  She was  subsequently discharged home in stable condition on November 11, 2002.   DISCHARGE DIAGNOSES:  1. Acute lateral myocardial infarction.  2. Tobacco abuse.  3. Hypertension.  4. History of hypercholesterolemia.   DISCHARGE MEDICATIONS:  1. Coated aspirin 325 mg per day.  2. Plavix 75 mg daily.  3. Toprol-XL 50 mg per day.  4. Lisinopril 20 mg per day.  5. Prempro 2.5/0.625 mg daily.  6. Maxzide once daily.  7. Zocor 20 mg per day.  8. Norvasc 5 mg per day.  9. Nitroglycerin sublingual p.r.n.   The patient is to slowly advance her walking.  She is instructed on a low-  fat diet.  She is instructed to stop smoking.  She will have follow-up with  Dr. Swaziland in one to two weeks.   DISCHARGE STATUS:  Improved.                                                Peter M. Swaziland, M.D.    PMJ/MEDQ  D:  11/22/2002  T:  11/22/2002  Job:  [id number]   cc:   Vale Haven. Andrey Campanile,  M.D.  [address]  [place]  [place] [address]  Fax: [phone]

## 2010-06-26 NOTE — H&P (Signed)
NAME:  Desiree Floyd, FURNISH NO.:  [id number]   MEDICAL RECORD NO.:  [id number]                   PATIENT TYPE:  INP   LOCATION:  1823                                 FACILITY:  [facility]   PHYSICIAN:  Peter M. Swaziland, M.D.               DATE OF BIRTH:  March 10, 1932   DATE OF ADMISSION:  11/08/2002  DATE OF DISCHARGE:                                HISTORY & PHYSICAL   HISTORY OF PRESENT ILLNESS:  Desiree Floyd is a 77 year old white female with  a history of hypertension, tobacco abuse and hypercholesterolemia, who  presents with acute onset of substernal chest pain at 9:30 a.m. today.  The  pain was left precordial, radiating to the left axilla and to the left arm.  She became diaphoretic.  Her pain was an 8 on a scale of 10 and was rated as  severe.  She had no nausea, vomiting or shortness of breath.  She has no  prior history of cardiac disease.  ECG on arrival to the emergency room  showed acute ST elevation in leads V2 through V4, consistent with anterior  injury.   PAST MEDICAL HISTORY:  1. Remote T&A.  2. History of hypertension.  3. History of hypercholesterolemia.  4. The patient has had previous surgery x2 on her ear for hearing loss     including implantation of prosthesis.   MEDICATIONS:  1. Maxzide 25/37.5 mg daily.  2. Lisinopril 20 mg per day.  3. Zocor 10 mg per day.  4. Prempro 2.5 mg daily.   SOCIAL HISTORY:  The patient is married.  She has two children.  She smokes  one pack per day.  She denies alcohol use.   FAMILY HISTORY:  Father died at age 21 with myocardial infarction.  Mother  died at age 71 of old age.   REVIEW OF SYSTEMS:  Denies any bleeding disorder, stroke, peptic ulcer  disease or kidney disease.  No claudication.  Other review of systems are  negative.   PHYSICAL EXAMINATION:  GENERAL:  The patient is an elderly-appearing white  female in mild distress.  VITAL SIGNS:  Blood pressure is 110/70.  Pulse 68 and  regular.  She is  afebrile.  HEENT:  Pupils equal, round and reactive to light and accommodation.  Extraocular movements are intact.  Oropharynx is clear.  NECK:  Neck is without JVD, adenopathy or bruits.  LUNGS:  Lungs are clear.  CARDIAC:  Exam reveals a regular rate and rhythm, normal S1 and S2, without  gallops, murmurs, rubs or clicks.  ABDOMEN:  Abdomen is soft and nontender.  There is no hepatosplenomegaly,  masses or bruits.  EXTREMITIES:  Extremities are without edema.  Pulses are 2+ and symmetric.  NEUROLOGIC:  Exam is nonfocal.   LABORATORY DATA:  Sodium 140, potassium 3.6, chloride 104, CO2 27, BUN 11,  creatinine 0.7, glucose of 151.  Hemoglobin is 15.  Point-of-care cardiac  enzymes are negative x1.   Chest x-ray shows COPD with no active disease.   ECG shows normal sinus rhythm with ST elevation in leads V2 through V4  consistent with acute anterior injury.   IMPRESSION:  1. Acute anterior myocardial infarction.  2. Chronic obstructive pulmonary disease.  3. Hypertension.  4. Hypercholesterolemia.  5. Tobacco abuse.   PLAN:  Patient treated in the emergency room with aspirin, IV nitroglycerin,  IV heparin and IV Lopressor and she will be taken for emergent cardiac  catheterization and intervention.                                                Peter M. Swaziland, M.D.    PMJ/MEDQ  D:  11/08/2002  T:  11/08/2002  Job:  [id number]   cc:   Vale Haven. Andrey Campanile, M.D.  [address]  [place]  [place] [address]  Fax: [phone]

## 2010-06-26 NOTE — Op Note (Signed)
NAME:  Desiree Floyd, Desiree Floyd NO.:  [id number]   MEDICAL RECORD NO.:  [id number]                   PATIENT TYPE:  AMB   LOCATION:  DSC                                  FACILITY:  [facility]   PHYSICIAN:  Carolan Shiver, M.D.                 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   POSTOPERATIVE DIAGNOSES:  Otosclerosis, left ear with the same moderate  mixed hearing loss, left ear.   OPERATION PERFORMED:  Stapedectomy, left ear with 4.0 mm Robinson stainless  steel stapedectomy prosthesis with a large well, narrow shaft and a vein  graft.   SURGEON:  Carolan Shiver, M.D.   ANESTHESIA:  [place]aren [place], M.D.  General endotracheal.   COMPLICATIONS:  None.   OPERATIVE FINDINGS:  The patient was found to have otosclerosis, left ear  with fixed staples and blue foot plate.  She had a mobile malleus and incus.  Her facial nerve was covered by bone in the horizontal portion.  Middle ear  mucosa was healthy.  An uncomplicated stapedectomy was performed.  The  stapes was removed.  The staples superstructure was removed along with the  anterior one half of the foot plate after control hole was made in the  center of the foot plate.  The posterior one half of the foot plate was  removed with a Farrior rasp.  A vein graft was placed and a 4.0 mm Robinson  stainless steel stapedectomy prosthesis with a large well, narrow shaft was  placed without difficulty.  There was  positive round window reflex.  There  were no perforations.  The chorda tympani nerve was dissected free and  preserved.   INDICATIONS FOR PROCEDURE:  The patient is a 77 year old white female known  to have otosclerosis.  She had undergone a successful stapedectomy, right  ear in the 1970s by Dr. Lenore Cordia.   The details of that procedure were  not known.  She developed progressive sensory neural hearing loss, left ear.  I placed her on Florical in the past and she was currently taking the  medication.   On physical examination she had an intact left tympanic membrane with  negative 256 and 512 tuning forks and lateralized on Weber to the left ear.  She had intact facial function.  The remainder of her head and neck exam was  unremarkable.  Audiometric testing on October 12, 2001 (preop) showed a  moderate to moderately severe mixed hearing loss, left ear, with an SRT of  55 db and 80% discrimination with an absent stapedial reflex.  The patient  was diagnosed as having otosclerosis, left ear and was recommended for a  stapedectomy, left ear using a 4.0  mm Robinson stapedectomy prosthesis with  a large well narrow shaft over a vein graft.  The risks and complications of  the procedures were explained to her, questions were invited and answered  and informed consent was signed.   JUSTIFICATION FOR OUTPATIENT SETTING:  The patient's age and need for  general endotracheal anesthesia.   JUSTIFICATION FOR OVERNIGHT STAY:  23 hours of bedrest status post  stapedectomy, left ear.   DESCRIPTION OF PROCEDURE:  After the patient was taken to the operating  room, she was placed in the supine position and an IV begun by Dr. Diamantina Monks.  A general IV induction was performed by Dr. Katrinka Blazing and the patient  was orally intubated without difficulty.  Eyelids were taped shut and she  was properly positioned and monitored.  Elbows and ankles were padded with  foam rubber.  Preoperative laboratory tests were within normal limits.   A small amount of hair was shaved on the left post auricular area.  Hair was  taped and a stockinet cap was applied.  Her left ear and hemi face were then  prepped with 70% isopropyl alcohol.  Her left distal forearm was prepped  with the same alcohol solution.  Her left post  auricular area was then  infiltrated with 1 cc of 1% Xylocaine with 1:100,000 epinephrine.  A small  amount of the same local anesthesia was infiltrated over  the distal left  forearm vein.  The patient's left ear forearm, left ear and hemi face were  then prepped with Betadine and her left forearm was draped in the standard  fashion for a vein graft.   A 1 cm incision was then made perpendicular to a distal left forearm vein.  A 1 cm segment of vein graft was harvested and placed in saline.  The vein  was ligated with 4-0 silk stick ties and the skin was closed with  interrupted 5-0 Novofils.  A bacitracin band-aid was applied and her arm was  padded and tucked by her left side.  Gloves and gowns were changed.   The patient's left ear and hemi face were then draped and draped in the  standard fashion.  Examination of the left ear canal revealed a 5.5 mm  diameter canal which was dilated to a #7.0 mm.  The ear canal was  meticulously cleaned and debrided and irrigated with saline.  Four quadrant  ear canal blocks were performed with 3 cc of 2% Xylocaine with 1:50,000  epinephrine and five minutes was allowed to elapse by the clock.  Meanwhile,  the vein graft which had been previously harvested was opened, pressed in a  vein press and dried for later use.   Now the ___________ flap was then elevated with McCabe and [employer] and  the middle ear was entered without difficulty.  No perforations were  created.  Upon entering the middle ear, the middle ear mucosa was found to  be healthy.  The malleus and incus were found to be mobile and the staples  was fixed solidly.  There was a blue foot plate which was evenly contoured.  The facial nerve at the horizontal portion was covered by bone.  Several  adhesions were lysed between the stapes superstructure and the horizontal  portion of the facial canal.  A measurement was then taken from the blue foot plate to the medial surface of the  incus.  This measured 4.0 mm.  The  mucosa surrounding the oval window was  then roughened with a straight Cox  pick because covering the facial canal was also abraded.  The poster bony  canal wall was then curetted to expose the entire foot plate and that  portion of the facial nerve and the pyramidal eminence.  The chorda tympani  nerve was dissected free and preserved during this maneuver.  The  incudostapedial  joint was then separated with a tab knife and the stapedial  tendon was cut with Bellucci scissors.   A small control hole was then made in the center of the foot plate with a  straight Cox pick and the stapes superstructure was downfractured.  The  superstructure and the anterior one half of the foot plate were removed as  one assembly and the posterior one half of the foot plate was then removed  with a Farrior rasp to accomplish complete footplate removal.    The vein graft which had been previously dried and trimmed was then placed  on the promontory side across the oval window and draped over the facial  canal superiorly.  This was an oversized vein graft used purposefully.  The  graft was then tucked into the oval window and a small dimple was created  with the measuring rod.   A 4.0 mm Robinson stainless steel stapedectomy prosthesis with a large well  and narrow shaft which had been previously measured to confirm its correct  length was then lowered into the middle ear.  The shaft of the prosthesis  was placed in the dimple in the vein graft in the oval window and the well  of the prosthesis was placed adjacent to the lenticular process.  Using a  two-handed technique, the incus was gently retracted laterally with an incus  hook and the well of the prosthesis was guided onto the lenticular process  in one smooth motion, using a strut guide.  The wire keeper was then rotated  for 110 degrees onto the lenticular process.  Gentle palpation of the  malleus revealed  excellent motion of the ossicular chain and prosthesis  assembly.  There was a positive round window reflex.  Vein graft was then  packed into position using 1/8 disk of Gelfoam soaked in saline.  A small  piece of vein graft which had been left over was then trimmed and placed  across the lenticular process and long process of the incus to serve as a  shrink wrap to protect the wire keeper from the tympanic membrane.  All  cotton balls were then removed and the cotton ball count was correct.  The  atticotomy flap was then turned to anatomic position by draping it up the  posterior superior bony canal wall.  The medial and lateral canals were then  packed with Gelfoam soaked in Cipro HC drops.  A bacitracin cotton ball was  placed.  The patient was then awakened and extubated and transferred to her  hospital bed.  She appeared to tolerate both the general endotracheal  anesthesia well and left the operating room in stable condition.  FLUIDS REPLACED:  350 cc.   ESTIMATED BLOOD LOSS:  Less than 10 cc.   Sponge, needle and cotton ball counts were correct at termination of the  procedure.  The patient received Ancef 1 gm IV, Zofran  4 mg IV at the beginning and end of the procedure,  Decadron 10 mg IV.   The patient will be admitted to the 23-hour recovery care unit for IV  hydration and pain  control and 23 hours of bed rest.  If stable overnight,  she will be discharged on the morning of October 17, 2001 and will be  instructed to return to my office on October 23, 2001 at 1:20 p.m. for  follow-up.   DISCHARGE MEDICATIONS:  1. Augmentin tablets 875 mg p.o. b.i.d. times 10 days.  2. Ciprodex Otic drops 7.5 cc three drops, a.s. b.i.d. times 10 days.  3. Darvocet-N 100 #30 one to two p.o. q.6h. p.r.n. pain.  4. Phenergan suppositories 25 mg one p.o. q.6h. p.r.n. nausea.   She is to continue on her home medications which include  1. Lipitor 10 mg p.o. q.d.  2. Dyazide 37.5 mg p.o.  q.d.  3. Vioxx 25 mg p.o. q.d.  4. Norvasc 5 mg p.o. q.d.  5. Lisinopril 20 mg p.o. q.d.  6. She is to continue on her Prempro, Florical and multivitamins with iron.   The patient will be instructed to keep her head elevated, avoid aspirin or  aspirin products.  Avoid heavy lifting greater than 20 pounds for the next  10 days, to avoid airplane flying for three weeks and avoid water exposure  a.s.  She is to call [phone] for any postoperative problems.  She will be  given both verbal and written instructions.                                               Carolan Shiver, M.D.    EMK/MEDQ  D:  10/16/2001  T:  10/16/2001  Job:  [id number]   cc:   Vale Haven. Andrey Campanile, M.D.

## 2010-06-29 ENCOUNTER — Other Ambulatory Visit: Payer: Self-pay | Admitting: *Deleted

## 2010-06-29 MED ORDER — LORAZEPAM 0.5 MG PO TABS: ORAL_TABLET | ORAL | Status: DC

## 2010-06-29 NOTE — Telephone Encounter (Signed)
Ok to refill 

## 2010-06-29 NOTE — Telephone Encounter (Signed)
Okay to refill? 

## 2010-06-29 NOTE — Telephone Encounter (Signed)
Rx sent in electronically.  

## 2010-09-03 ENCOUNTER — Other Ambulatory Visit: Payer: Self-pay | Admitting: *Deleted

## 2010-09-03 MED ORDER — LORAZEPAM 0.5 MG PO TABS: ORAL_TABLET | ORAL | Status: DC

## 2010-09-03 NOTE — Telephone Encounter (Signed)
Okay to refill? 

## 2010-09-03 NOTE — Telephone Encounter (Signed)
Rx called in as directed.   

## 2010-09-03 NOTE — Telephone Encounter (Signed)
plz phone in and notify pt. 

## 2010-09-21 ENCOUNTER — Other Ambulatory Visit: Payer: Self-pay | Admitting: Family Medicine

## 2010-09-21 DIAGNOSIS — Z1231 Encounter for screening mammogram for malignant neoplasm of breast: Secondary | ICD-10-CM

## 2010-09-25 ENCOUNTER — Telehealth: Payer: Self-pay | Admitting: *Deleted

## 2010-09-25 MED ORDER — ZOSTER VACCINE LIVE 19400 UNT/0.65ML ~~LOC~~ SOLR: 0.6500 mL | Freq: Once | SUBCUTANEOUS | Status: DC

## 2010-09-25 NOTE — Telephone Encounter (Signed)
Patient would like for you to please send a prescription for a [facility]ingles vaccine to [facility] [place].

## 2010-09-25 NOTE — Telephone Encounter (Signed)
Sent in to [facility]. plz notify pt.  To let us know when plan administered or when administered to add into chart.

## 2010-09-25 NOTE — Telephone Encounter (Signed)
Message left notifying patient that Rx had been sent in. Advised to call with an update on when she receives it so that we can update her chart.

## 2010-09-28 ENCOUNTER — Telehealth: Payer: Self-pay | Admitting: *Deleted

## 2010-09-28 NOTE — Telephone Encounter (Signed)
FYI pt recieved her shingles vac at [facility] on 09/25/10.

## 2010-09-28 NOTE — Telephone Encounter (Signed)
Noted. Thanks.

## 2010-10-20 ENCOUNTER — Other Ambulatory Visit: Payer: Self-pay | Admitting: *Deleted

## 2010-10-20 MED ORDER — LORAZEPAM 0.5 MG PO TABS: ORAL_TABLET | ORAL | Status: DC

## 2010-10-20 NOTE — Telephone Encounter (Signed)
Okay to refill? 

## 2010-10-20 NOTE — Telephone Encounter (Signed)
Ok to refill 

## 2010-10-20 NOTE — Telephone Encounter (Signed)
Rx called in as directed.   

## 2010-10-26 ENCOUNTER — Encounter: Payer: Self-pay | Admitting: *Deleted

## 2010-10-30 ENCOUNTER — Ambulatory Visit: Payer: Medicare Other | Admitting: Cardiology

## 2010-11-02 ENCOUNTER — Ambulatory Visit
Admission: RE | Admit: 2010-11-02 | Discharge: 2010-11-02 | Disposition: A | Discharge status: Home / Self Care | Payer: Medicare Other | Source: Ambulatory Visit | Attending: Family Medicine | Admitting: Family Medicine

## 2010-11-02 DIAGNOSIS — Z1231 Encounter for screening mammogram for malignant neoplasm of breast: Secondary | ICD-10-CM

## 2010-11-03 ENCOUNTER — Encounter: Payer: Self-pay | Admitting: Cardiology

## 2010-11-03 ENCOUNTER — Ambulatory Visit (INDEPENDENT_AMBULATORY_CARE_PROVIDER_SITE_OTHER): Payer: Medicare Other | Admitting: Cardiology

## 2010-11-03 ENCOUNTER — Encounter: Payer: Self-pay | Admitting: *Deleted

## 2010-11-03 VITALS — BP 128/72 | HR 74 | Ht 62.0 in | Wt 127.0 lb

## 2010-11-03 DIAGNOSIS — F17200 Nicotine dependence, unspecified, uncomplicated: Secondary | ICD-10-CM

## 2010-11-03 DIAGNOSIS — I2510 Atherosclerotic heart disease of native coronary artery without angina pectoris: Secondary | ICD-10-CM | POA: Insufficient documentation

## 2010-11-03 DIAGNOSIS — I10 Essential (primary) hypertension: Secondary | ICD-10-CM

## 2010-11-03 DIAGNOSIS — I491 Atrial premature depolarization: Secondary | ICD-10-CM

## 2010-11-03 DIAGNOSIS — Z720 Tobacco use: Secondary | ICD-10-CM

## 2010-11-03 DIAGNOSIS — E785 Hyperlipidemia, unspecified: Secondary | ICD-10-CM

## 2010-11-03 NOTE — Assessment & Plan Note (Signed)
Reports that she is going to have followup lipid studies with her primary care.

## 2010-11-03 NOTE — Assessment & Plan Note (Signed)
We spent 5 minutes discussing smoking cessation strategies. Patient is not willing to quit at this time.

## 2010-11-03 NOTE — Patient Instructions (Signed)
Continue your current medications.  I will see you again in 6 months.  I recommend you quit smoking!

## 2010-11-03 NOTE — Assessment & Plan Note (Signed)
Blood pressure control is adequate.

## 2010-11-03 NOTE — Assessment & Plan Note (Signed)
She is status post remote lateral myocardial infarction treated with stenting of the left circumflex coronary with a bare-metal stent. She remains asymptomatic. Her last stress test in January of 2010 was normal. I stressed the importance of risk factor modification particularly with smoking cessation.

## 2010-11-03 NOTE — Progress Notes (Signed)
Desiree Floyd Date of Birth: 1932-10-08   History of Present Illness: [name] is seen today for followup visit. She has a history of coronary disease with a remote lateral myocardial infarction in 2004 treated with stenting of the left circumflex coronary with a drug-eluting stent. She also has a history of dyslipidemia, diabetes mellitus, hypertension, and tobacco use. She continues to smoke one pack per day. She denies any current chest pain or dyspnea. She has had no significant cough. She denies any edema or palpitations. She states that she feels fairly well.  Current Outpatient Prescriptions on File Prior to Visit  Medication Sig Dispense Refill  . allopurinol (ZYLOPRIM) 300 MG tablet Take 1 tablet (300 mg total) by mouth daily.  90 tablet  3  . amLODipine (NORVASC) 5 MG tablet Take 1 tablet (5 mg total) by mouth daily.  90 tablet  3  . aspirin 81 MG tablet Take 81 mg by mouth daily.        . Blood Glucose Monitoring Suppl (ONE TOUCH ULTRA SYSTEM KIT) W/DEVICE KIT 1 kit by Does not apply route once.        Marland Kitchen glucose blood (ONE TOUCH TEST STRIPS) test strip 1 each by Other route daily. Use as instructed       . Lancets (ONETOUCH ULTRASOFT) lancets 1 each by Other route daily. Use as instructed       . LORazepam (ATIVAN) 0.5 MG tablet Take 1/2 to 1 daily by mouth as needed anxiety  30 tablet  0  . metoprolol tartrate (LOPRESSOR) 25 MG tablet Take 25 mg by mouth. Take 1/2 tab in am and 1/2 tab in pm       . Omega-3 Fatty Acids (FISH OIL) 1000 MG CAPS Take 4 capsules by mouth daily.        . simvastatin (ZOCOR) 20 MG tablet Take 1 tablet (20 mg total) by mouth at bedtime.  90 tablet  3  . triamterene-hydrochlorothiazide (DYAZIDE) 37.5-25 MG per capsule Take 1 capsule by mouth daily.  90 capsule  3  . zoster vaccine live, PF, (ZOSTAVAX) [address] UNT/0.65ML injection Inject 19,400 Units into the skin once.  1 vial  0    No Known Allergies  Past Medical History  Diagnosis Date  .  Hypertension   . Hyperlipidemia   . Diabetes mellitus 2012    A1c 6.8%  . CAD (coronary artery disease)     s/p stent 2004 [[place]][  . Tobacco abuse   . OA (osteoarthritis)   . Pseudo-gout     calcium pyrophosphate crystal deposition disease  . COPD (chronic obstructive pulmonary disease)     not on meds, FEV1 39%, FVC 49%, ratio 0.59, not reversible w SABA (05/2008)  . Asthma   . Allergy   . Urge incontinence 2008    s/p urodynamics [Dahlstedt][  . Benign breast lumps     knows breast lumps, told scar tissue from implants  . Gout     Deveshwar/Landau  . PAC (premature atrial contraction)     chronic    Past Surgical History  Procedure Date  . Breast surgery   . Tonsillectomy   . Cataract extraction, bilateral 2010  . Coronary angioplasty with stent placement 2004  . Inner ear surgery     x2 for hearing loss,prosthesis implanted    History  Smoking status  . Current Everyday Smoker -- 1.0 packs/day  Smokeless tobacco  . Not on file    History  Alcohol  Use  . Yes    Family History  Problem Relation Age of Onset  . Heart disease Father 70    CAD AND HEART ATTACK  . Other Mother     old age, age 37    Review of Systems: As noted in history of present illness. All other systems were reviewed and are negative.  Physical Exam: BP 128/72  Pulse 74  Ht 5\' 2"  (1.575 m)  Wt 127 lb (57.607 kg)  BMI 23.23 kg/m2 She is an elderly white female in no acute distress.The patient is alert and oriented x 3.  The mood and affect are normal.  The skin is warm and dry.  Color is normal.  The HEENT exam reveals that the sclera are nonicteric.  The mucous membranes are moist.  The carotids are 2+ without bruits.  There is no thyromegaly.  There is no JVD.  The lungs are clear.  The chest wall is non tender.  The heart exam reveals a regular rate with a normal S1 and S2.  There are no murmurs, gallops, or rubs.  The PMI is not displaced.   Abdominal exam reveals good bowel  sounds.  There is no guarding or rebound.  There is no hepatosplenomegaly or tenderness.  There are no masses.  Exam of the legs reveal no clubbing, cyanosis, or edema.  The legs are without rashes.  The distal pulses are intact.  Cranial nerves II - XII are intact.  Motor and sensory functions are intact.  The gait is normal.  LABORATORY DATA: ECG demonstrates normal sinus rhythm with frequent PACs. There is nonspecific ST abnormality.  Assessment / Plan:

## 2010-11-09 ENCOUNTER — Telehealth: Payer: Self-pay | Admitting: *Deleted

## 2010-11-09 MED ORDER — LORAZEPAM 1 MG PO TABS: 1.0000 mg | ORAL_TABLET | Freq: Every evening | ORAL | Status: DC | PRN

## 2010-11-09 NOTE — Telephone Encounter (Signed)
May use 1mg  temporarily prn, not nightly. If not sufficient, will need to come in for office visit to discuss.

## 2010-11-09 NOTE — Telephone Encounter (Signed)
Pt states that her current dose of lorazepam, one half to one at bedtime is no longer working and she would like to be increased to 1 mg at bedtime and as needed.  [facility].

## 2010-11-10 NOTE — Telephone Encounter (Signed)
Patient notified and Rx called in as directed. 

## 2010-12-04 ENCOUNTER — Encounter: Payer: Self-pay | Admitting: Family Medicine

## 2010-12-11 ENCOUNTER — Other Ambulatory Visit: Payer: Self-pay | Admitting: *Deleted

## 2010-12-11 MED ORDER — LORAZEPAM 1 MG PO TABS: 1.0000 mg | ORAL_TABLET | Freq: Every evening | ORAL | Status: DC | PRN

## 2010-12-11 NOTE — Telephone Encounter (Signed)
OK to refill

## 2010-12-11 NOTE — Telephone Encounter (Signed)
Rx called in as directed.   

## 2010-12-11 NOTE — Telephone Encounter (Signed)
Ok to refill 

## 2011-01-04 ENCOUNTER — Other Ambulatory Visit: Payer: Self-pay | Admitting: *Deleted

## 2011-01-04 MED ORDER — METOPROLOL TARTRATE 25 MG PO TABS: ORAL_TABLET | ORAL | Status: DC

## 2011-01-05 ENCOUNTER — Other Ambulatory Visit: Payer: Self-pay | Admitting: *Deleted

## 2011-01-05 MED ORDER — METOPROLOL TARTRATE 25 MG PO TABS: ORAL_TABLET | ORAL | Status: DC

## 2011-01-05 NOTE — Telephone Encounter (Signed)
Refill sent to [facility] in error yesterday, [facility] cancelled that script.  Should have been sent to [facility].

## 2011-01-13 ENCOUNTER — Other Ambulatory Visit: Payer: Self-pay | Admitting: *Deleted

## 2011-01-13 MED ORDER — LORAZEPAM 1 MG PO TABS: 1.0000 mg | ORAL_TABLET | Freq: Every evening | ORAL | Status: DC | PRN

## 2011-01-13 NOTE — Telephone Encounter (Signed)
Ok to refill 

## 2011-01-14 NOTE — Telephone Encounter (Signed)
Medication called into pharmacy as directed.

## 2011-02-04 ENCOUNTER — Telehealth: Payer: Self-pay | Admitting: Internal Medicine

## 2011-02-15 ENCOUNTER — Other Ambulatory Visit: Payer: Self-pay | Admitting: *Deleted

## 2011-02-15 MED ORDER — LORAZEPAM 1 MG PO TABS: 1.0000 mg | ORAL_TABLET | Freq: Every evening | ORAL | Status: DC | PRN

## 2011-02-15 NOTE — Telephone Encounter (Signed)
OK to refill

## 2011-02-15 NOTE — Telephone Encounter (Signed)
Ok to refill 

## 2011-02-15 NOTE — Telephone Encounter (Signed)
Rx called in as directed.   

## 2011-02-17 NOTE — Telephone Encounter (Signed)
Opened in error by Pam Clement 

## 2011-03-16 ENCOUNTER — Ambulatory Visit (INDEPENDENT_AMBULATORY_CARE_PROVIDER_SITE_OTHER): Payer: Medicare Other | Admitting: Family Medicine

## 2011-03-16 ENCOUNTER — Other Ambulatory Visit: Payer: Self-pay | Admitting: Family Medicine

## 2011-03-16 ENCOUNTER — Encounter: Payer: Self-pay | Admitting: Family Medicine

## 2011-03-16 ENCOUNTER — Other Ambulatory Visit: Payer: Self-pay | Admitting: *Deleted

## 2011-03-16 VITALS — BP 114/60 | HR 64 | Temp 98.8°F | Wt 124.2 lb

## 2011-03-16 DIAGNOSIS — E785 Hyperlipidemia, unspecified: Secondary | ICD-10-CM

## 2011-03-16 DIAGNOSIS — J441 Chronic obstructive pulmonary disease with (acute) exacerbation: Secondary | ICD-10-CM | POA: Insufficient documentation

## 2011-03-16 DIAGNOSIS — I10 Essential (primary) hypertension: Secondary | ICD-10-CM

## 2011-03-16 DIAGNOSIS — M109 Gout, unspecified: Secondary | ICD-10-CM

## 2011-03-16 DIAGNOSIS — G4700 Insomnia, unspecified: Secondary | ICD-10-CM

## 2011-03-16 DIAGNOSIS — J449 Chronic obstructive pulmonary disease, unspecified: Secondary | ICD-10-CM

## 2011-03-16 DIAGNOSIS — J4489 Other specified chronic obstructive pulmonary disease: Secondary | ICD-10-CM

## 2011-03-16 DIAGNOSIS — F17200 Nicotine dependence, unspecified, uncomplicated: Secondary | ICD-10-CM

## 2011-03-16 DIAGNOSIS — E119 Type 2 diabetes mellitus without complications: Secondary | ICD-10-CM

## 2011-03-16 LAB — LIPID PANEL
Cholesterol: 127 mg/dL (ref 0–200)
LDL Cholesterol: 59 mg/dL (ref 0–99)
Triglycerides: 61 mg/dL (ref 0.0–149.0)

## 2011-03-16 LAB — CBC WITH DIFFERENTIAL/PLATELET
Basophils Absolute: 0 10*3/uL (ref 0.0–0.1)
Eosinophils Relative: 0.2 % (ref 0.0–5.0)
Lymphocytes Relative: 13.6 % (ref 12.0–46.0)
Monocytes Relative: 11.6 % (ref 3.0–12.0)
Neutrophils Relative %: 74.4 % (ref 43.0–77.0)
Platelets: 187 10*3/uL (ref 150.0–400.0)
RDW: 14.8 % — ABNORMAL HIGH (ref 11.5–14.6)
WBC: 18.1 10*3/uL (ref 4.5–10.5)

## 2011-03-16 LAB — COMPREHENSIVE METABOLIC PANEL
AST: 18 U/L (ref 0–37)
Albumin: 3.6 g/dL (ref 3.5–5.2)
BUN: 12 mg/dL (ref 6–23)
CO2: 32 mEq/L (ref 19–32)
Calcium: 9.3 mg/dL (ref 8.4–10.5)
Chloride: 99 mEq/L (ref 96–112)
Creatinine, Ser: 0.9 mg/dL (ref 0.4–1.2)
GFR: 63.05 mL/min (ref >60.00)
Glucose, Bld: 139 mg/dL — ABNORMAL HIGH (ref 70–99)

## 2011-03-16 LAB — HEMOGLOBIN A1C: Hgb A1c MFr Bld: 6.6 % — ABNORMAL HIGH (ref 4.6–6.5)

## 2011-03-16 LAB — MICROALBUMIN / CREATININE URINE RATIO
Creatinine,U: 249.1 mg/dL
Microalb, Ur: 0.6 mg/dL (ref 0.0–1.9)

## 2011-03-16 LAB — TSH: TSH: 1.57 u[IU]/mL (ref 0.35–5.50)

## 2011-03-16 LAB — URIC ACID: Uric Acid, Serum: 3.8 mg/dL (ref 2.4–7.0)

## 2011-03-16 MED ORDER — PREDNISONE 20 MG PO TABS: 40.0000 mg | ORAL_TABLET | Freq: Every day | ORAL | Status: DC

## 2011-03-16 MED ORDER — PREDNISONE 20 MG PO TABS: 40.0000 mg | ORAL_TABLET | Freq: Every day | ORAL | Status: AC

## 2011-03-16 MED ORDER — TRAZODONE HCL 50 MG PO TABS: 25.0000 mg | ORAL_TABLET | Freq: Every evening | ORAL | Status: DC | PRN

## 2011-03-16 MED ORDER — DOXYCYCLINE HYCLATE 100 MG PO CAPS: 100.0000 mg | ORAL_CAPSULE | Freq: Two times a day (BID) | ORAL | Status: AC

## 2011-03-16 MED ORDER — METOPROLOL TARTRATE 25 MG PO TABS: ORAL_TABLET | ORAL | Status: DC

## 2011-03-16 MED ORDER — DOXYCYCLINE HYCLATE 100 MG PO CAPS: 100.0000 mg | ORAL_CAPSULE | Freq: Two times a day (BID) | ORAL | Status: DC

## 2011-03-16 MED ORDER — POTASSIUM CHLORIDE ER 10 MEQ PO TBCR: 10.0000 meq | EXTENDED_RELEASE_TABLET | Freq: Every day | ORAL | Status: DC

## 2011-03-16 NOTE — Assessment & Plan Note (Signed)
Early sinusitis leading to COPD exacerbation. Treat with doxycycline x 10 days and prednisone course. Update [place] if not improving as expected. Return 1-2 mo for spirometry, afterwards for wellness visit.

## 2011-03-16 NOTE — Assessment & Plan Note (Signed)
rtc for spirometry when feeling better

## 2011-03-16 NOTE — Telephone Encounter (Signed)
Meds sent to mail order and [facility]

## 2011-03-16 NOTE — Patient Instructions (Addendum)
Return in 1-2 months for spriometry pre and post albuterol (30 min appt) Return at your convenience for wellness visit. For sinuses - take doxycyline twice daily for 10 days. I think you also have COPD exacerbation - take prednisone course for 7 days. Use nasal saline for nasal congestion, consider simple mucinex with plenty of water to break up mucous. I've refilled metoprolol.

## 2011-03-16 NOTE — Progress Notes (Signed)
  Subjective:    Patient ID: Desiree Floyd, female    DOB: 1932-06-25, 78 y.o.   MRN: [id number]  HPI CC: sinus congestion, med refill  78 yo longtime smoker with h/o HTN, gout, COPD, CAD with lateral MI 2004 s/p DES stent to circumflex.  Would like refill of metoprolol.  fasting today, would like labs.  Insomnia - has been taking ativan regularly qhs for sleep, no longer working for insomnia.  Unsure if initially prescribed for this.  Has been on xanax in past.  4d h/o blowing nose with purulent thick yellow mucous, coughing productive of chunks of sputum.  Nothing tastes good.  Mild R sided pressure and pressure behind eyes bilaterally.  + RN and PNDrainage.  + increased cough, more SOB and more purulent sputum production than normal.  So far has tried listerine and peroxide gargles.  Tylenol as well.  No fevers/chills, abd pain, n/v, ear pain, tooth pain, HA.  No sick contacts at home.   Dropped back on smoking since ill, but prior 1 ppd. + h/o asthma, COPD, last spirometry 2010. Due for wellness visit.  Lab Results  Component Value Date   HGBA1C 6.7* 03/12/2010    No falls in the last year.  Review of Systems Per HPI    Objective:   Physical Exam  Nursing note and vitals reviewed. Constitutional: She appears well-developed and well-nourished. No distress.  HENT:  Head: Normocephalic and atraumatic.  Right Ear: Tympanic membrane, external ear and ear canal normal. Decreased hearing is noted.  Left Ear: Tympanic membrane, external ear and ear canal normal. Decreased hearing is noted.  Nose: Nose normal. No mucosal edema or rhinorrhea. Right sinus exhibits no maxillary sinus tenderness and no frontal sinus tenderness. Left sinus exhibits no maxillary sinus tenderness and no frontal sinus tenderness.  Mouth/Throat: Uvula is midline and mucous membranes are normal. Posterior oropharyngeal erythema present. No oropharyngeal exudate, posterior oropharyngeal edema or tonsillar  abscesses.  Eyes: Conjunctivae and EOM are normal. Pupils are equal, round, and reactive to light. No scleral icterus.  Neck: Normal range of motion. Neck supple. No JVD present. No thyromegaly present.  Cardiovascular: Normal rate, regular rhythm, normal heart sounds and intact distal pulses.   No murmur heard. Pulmonary/Chest: Effort normal. No respiratory distress. She has decreased breath sounds (coarse). She has wheezes. She has rhonchi (exp rhonchi throughout). She has no rales.       Nl WOB  Musculoskeletal: She exhibits no edema.  Lymphadenopathy:    She has no cervical adenopathy.  Skin: Skin is warm and dry. No rash noted.       Assessment & Plan:

## 2011-03-16 NOTE — Assessment & Plan Note (Signed)
Strongly encouraged cessation. 

## 2011-03-16 NOTE — Assessment & Plan Note (Signed)
Taper off ativan, start trazodone nightly. Update me with response.

## 2011-04-12 ENCOUNTER — Other Ambulatory Visit: Payer: Self-pay | Admitting: Family Medicine

## 2011-04-16 ENCOUNTER — Telehealth: Payer: Self-pay

## 2011-04-16 ENCOUNTER — Other Ambulatory Visit: Payer: Self-pay | Admitting: Family Medicine

## 2011-04-16 DIAGNOSIS — D72829 Elevated white blood cell count, unspecified: Secondary | ICD-10-CM

## 2011-04-16 DIAGNOSIS — E876 Hypokalemia: Secondary | ICD-10-CM

## 2011-04-16 NOTE — Telephone Encounter (Signed)
Pt cked at pharmacy and K was not called in. Dr Sharen Hones said not prescribed for long term use. Dr Reece Agar said to schedule K lab in 1-2 weeks to verify K in normal range. Patient notified as instructed by telephone and pt scheduled K 04/29/11 at 10am.Dr G said he would put order for K in computer.

## 2011-04-29 ENCOUNTER — Other Ambulatory Visit (INDEPENDENT_AMBULATORY_CARE_PROVIDER_SITE_OTHER): Payer: Medicare Other

## 2011-04-29 DIAGNOSIS — E876 Hypokalemia: Secondary | ICD-10-CM

## 2011-04-29 DIAGNOSIS — D72829 Elevated white blood cell count, unspecified: Secondary | ICD-10-CM

## 2011-04-29 LAB — CBC WITH DIFFERENTIAL/PLATELET
Basophils Absolute: 0 10*3/uL (ref 0.0–0.1)
Eosinophils Absolute: 0.2 10*3/uL (ref 0.0–0.7)
HCT: 46.6 % — ABNORMAL HIGH (ref 36.0–46.0)
Hemoglobin: 15.5 g/dL — ABNORMAL HIGH (ref 12.0–15.0)
Lymphocytes Relative: 32 % (ref 12.0–46.0)
Lymphs Abs: 3 10*3/uL (ref 0.7–4.0)
MCHC: 33.3 g/dL (ref 30.0–36.0)
Monocytes Relative: 9.5 % (ref 3.0–12.0)
Neutro Abs: 5.3 10*3/uL (ref 1.4–7.7)
Platelets: 162 10*3/uL (ref 150.0–400.0)
RDW: 14.7 % — ABNORMAL HIGH (ref 11.5–14.6)

## 2011-04-29 LAB — POTASSIUM: Potassium: 3.8 mEq/L (ref 3.5–5.1)

## 2011-05-06 ENCOUNTER — Ambulatory Visit (INDEPENDENT_AMBULATORY_CARE_PROVIDER_SITE_OTHER): Payer: Medicare Other | Admitting: Cardiology

## 2011-05-06 ENCOUNTER — Encounter: Payer: Self-pay | Admitting: Cardiology

## 2011-05-06 VITALS — BP 122/66 | HR 72 | Ht 62.0 in | Wt 126.0 lb

## 2011-05-06 DIAGNOSIS — I2510 Atherosclerotic heart disease of native coronary artery without angina pectoris: Secondary | ICD-10-CM

## 2011-05-06 DIAGNOSIS — Z720 Tobacco use: Secondary | ICD-10-CM

## 2011-05-06 DIAGNOSIS — F17200 Nicotine dependence, unspecified, uncomplicated: Secondary | ICD-10-CM

## 2011-05-06 DIAGNOSIS — I10 Essential (primary) hypertension: Secondary | ICD-10-CM

## 2011-05-06 DIAGNOSIS — E785 Hyperlipidemia, unspecified: Secondary | ICD-10-CM

## 2011-05-06 DIAGNOSIS — E119 Type 2 diabetes mellitus without complications: Secondary | ICD-10-CM

## 2011-05-06 NOTE — Assessment & Plan Note (Signed)
Blood pressure is under excellent control today. We will continue with her current therapy. 

## 2011-05-06 NOTE — Assessment & Plan Note (Signed)
We reviewed her recent lipid panel which looked excellent. She will continue with statin therapy and fish oil

## 2011-05-06 NOTE — Assessment & Plan Note (Signed)
Her last stress test was 3 years ago in January of 2010. At that time she was able to exercise for 8 and 1/2 minutes on the Bruce protocol. She had normal perfusion imaging. I recommended a followup nuclear stress test at her convenience.

## 2011-05-06 NOTE — Progress Notes (Signed)
Desiree Floyd Date of Birth: 1932-03-19   History of Present Illness: Desiree Floyd is seen today for followup visit. She has a history of coronary disease with a remote lateral myocardial infarction in 2004 treated with stenting of the left circumflex coronary with a drug-eluting stent. She also has a history of dyslipidemia, diabetes mellitus, hypertension, and tobacco use. She continues to smoke one pack per day. She denies any chest pain or shortness of breath. She states that she is as active and exercises regularly.  Current Outpatient Prescriptions on File Prior to Visit  Medication Sig Dispense Refill  . allopurinol (ZYLOPRIM) 300 MG tablet Take 1 tablet (300 mg total) by mouth daily.  90 tablet  3  . amLODipine (NORVASC) 5 MG tablet Take 1 tablet (5 mg total) by mouth daily.  90 tablet  3  . aspirin 81 MG tablet Take 81 mg by mouth daily.        . Blood Glucose Monitoring Suppl (ONE TOUCH ULTRA SYSTEM KIT) W/DEVICE KIT 1 kit by Does not apply route once.        Marland Kitchen glucose blood (ONE TOUCH TEST STRIPS) test strip 1 each by Other route daily. Use as instructed       . Lancets (ONETOUCH ULTRASOFT) lancets 1 each by Other route daily. Use as instructed       . metoprolol tartrate (LOPRESSOR) 25 MG tablet Take 1/2 tab in am and 1/2 tab in pm  90 tablet  3  . Omega-3 Fatty Acids (FISH OIL) 1000 MG CAPS Take 4 capsules by mouth daily.        . potassium chloride (K-DUR) 10 MEQ tablet Take 1 tablet (10 mEq total) by mouth daily.  30 tablet  0  . simvastatin (ZOCOR) 20 MG tablet Take 1 tablet (20 mg total) by mouth at bedtime.  90 tablet  3  . triamterene-hydrochlorothiazide (DYAZIDE) 37.5-25 MG per capsule Take 1 capsule by mouth daily.  90 capsule  3  . LORazepam (ATIVAN) 1 MG tablet Take 1 tablet (1 mg total) by mouth at bedtime as needed (sleep).  30 tablet  0  . traZODone (DESYREL) 50 MG tablet Take 0.5-1 tablets (25-50 mg total) by mouth at bedtime as needed for sleep.  30 tablet  3      No Known Allergies  Past Medical History  Diagnosis Date  . Hypertension   . Hyperlipidemia   . Diabetes mellitus 2012    A1c 6.8%  . CAD (coronary artery disease)     s/p stent 2004 [[place]rynn [place]][  . Tobacco abuse   . OA (osteoarthritis)     of hands  . CPPD (pseudo-gout)   . COPD (chronic obstructive pulmonary disease)     not on meds, FEV1 39%, FVC 49%, ratio 0.59, not reversible w SABA (05/2008)  . Asthma   . Allergy   . Urge incontinence 2008    s/p urodynamics [Dahlstedt][  . Benign breast lumps     knows breast lumps, told scar tissue from implants  . Gout     Deveshwar/Landau  . PAC (premature atrial contraction)     chronic  . DJD (degenerative joint disease) of lumbar spine     Past Surgical History  Procedure Date  . Breast surgery   . Tonsillectomy   . Cataract extraction, bilateral 2010  . Coronary angioplasty with stent placement 2004  . Inner ear surgery     x2 for hearing loss,prosthesis implanted    History  Smoking status  . Current Everyday Smoker -- 1.0 packs/day  Smokeless tobacco  . Not on file    History  Alcohol Use  . Yes    Family History  Problem Relation Age of Onset  . Heart disease Father 60    CAD AND HEART ATTACK  . Other Mother     old age, age 27    Review of Systems: As noted in history of present illness. All other systems were reviewed and are negative.  Physical Exam: BP 122/66  Pulse 72  Ht 5\' 2"  (1.575 m)  Wt 126 lb (57.153 kg)  BMI 23.05 kg/m2 She is an elderly white female in no acute distress.The patient is alert and oriented x 3.  The mood and affect are normal.  The skin is warm and dry.  Color is normal.  The HEENT exam reveals that the sclera are nonicteric.  The mucous membranes are moist.  The carotids are 2+ without bruits.  There is no thyromegaly.  There is no JVD.  The lungs are clear.  The chest wall is non tender.  The heart exam reveals a regular rate with a normal S1 and S2.  There are no  murmurs, gallops, or rubs.  The PMI is not displaced.   Abdominal exam reveals good bowel sounds.  There is no guarding or rebound.  There is no hepatosplenomegaly or tenderness.  There are no masses.  Exam of the legs reveal no clubbing, cyanosis, or edema.  The legs are without rashes.  The distal pulses are intact.  Cranial nerves II - XII are intact.  Motor and sensory functions are intact.  The gait is normal.  LABORATORY DATA:   Assessment / Plan:

## 2011-05-06 NOTE — Patient Instructions (Signed)
Continue your current medication.  Stop smoking  I will schedule you for a nuclear stress test.  I will see you back in 6 months.

## 2011-06-03 ENCOUNTER — Other Ambulatory Visit: Payer: Self-pay | Admitting: *Deleted

## 2011-06-03 MED ORDER — AMLODIPINE BESYLATE 5 MG PO TABS: 5.0000 mg | ORAL_TABLET | Freq: Every day | ORAL | Status: DC

## 2011-06-03 MED ORDER — ALLOPURINOL 300 MG PO TABS: 300.0000 mg | ORAL_TABLET | Freq: Every day | ORAL | Status: DC

## 2011-06-03 MED ORDER — TRIAMTERENE-HCTZ 37.5-25 MG PO CAPS: 1.0000 | ORAL_CAPSULE | Freq: Every day | ORAL | Status: DC

## 2011-06-03 MED ORDER — SIMVASTATIN 20 MG PO TABS: 20.0000 mg | ORAL_TABLET | Freq: Every day | ORAL | Status: DC

## 2011-06-10 ENCOUNTER — Other Ambulatory Visit: Payer: Self-pay

## 2011-06-10 NOTE — Telephone Encounter (Signed)
Pt received 3 of her meds from [facility] but not triamterene HCTZ. Pt said sometimes they ship separate but wanted to verify was sent to [facility]. Verified for pt Triamterene HCTZ was sent along with other 3 meds. Pt acknowledged.

## 2011-06-14 ENCOUNTER — Other Ambulatory Visit: Payer: Self-pay

## 2011-06-14 MED ORDER — TRIAMTERENE-HCTZ 37.5-25 MG PO TABS: 1.0000 | ORAL_TABLET | Freq: Every day | ORAL | Status: DC

## 2011-06-14 NOTE — Telephone Encounter (Signed)
Sent in tablets. plz notify.

## 2011-06-14 NOTE — Telephone Encounter (Signed)
[facility] left v/m received escript for Triamterene HCTZ 37.5-12.5 mg capsules. Pt usually gets tablets. [facility] wants to verify caps instead of tabs. [facility] request call back [phone] ref # [id number].

## 2011-06-15 NOTE — Telephone Encounter (Signed)
Pharmacy notified.

## 2011-06-28 ENCOUNTER — Encounter (HOSPITAL_COMMUNITY): Payer: Medicare Other

## 2011-06-30 ENCOUNTER — Ambulatory Visit (HOSPITAL_COMMUNITY): Payer: Medicare Other | Attending: Cardiovascular Disease | Admitting: Radiology

## 2011-06-30 VITALS — BP 118/65 | Ht 62.0 in | Wt 123.0 lb

## 2011-06-30 DIAGNOSIS — E785 Hyperlipidemia, unspecified: Secondary | ICD-10-CM | POA: Insufficient documentation

## 2011-06-30 DIAGNOSIS — I2510 Atherosclerotic heart disease of native coronary artery without angina pectoris: Secondary | ICD-10-CM

## 2011-06-30 DIAGNOSIS — R0602 Shortness of breath: Secondary | ICD-10-CM

## 2011-06-30 DIAGNOSIS — Z720 Tobacco use: Secondary | ICD-10-CM

## 2011-06-30 DIAGNOSIS — E119 Type 2 diabetes mellitus without complications: Secondary | ICD-10-CM | POA: Insufficient documentation

## 2011-06-30 DIAGNOSIS — F17200 Nicotine dependence, unspecified, uncomplicated: Secondary | ICD-10-CM | POA: Insufficient documentation

## 2011-06-30 MED ORDER — TECHNETIUM TC 99M TETROFOSMIN IV KIT
11.0000 | PACK | Freq: Once | INTRAVENOUS | Status: AC | PRN
  Administered 2011-06-30: 11 via INTRAVENOUS

## 2011-06-30 MED ORDER — REGADENOSON 0.4 MG/5ML IV SOLN
0.4000 mg | Freq: Once | INTRAVENOUS | Status: AC
  Administered 2011-06-30: 0.4 mg via INTRAVENOUS

## 2011-06-30 MED ORDER — TECHNETIUM TC 99M TETROFOSMIN IV KIT
33.0000 | PACK | Freq: Once | INTRAVENOUS | Status: AC | PRN
  Administered 2011-06-30: 33 via INTRAVENOUS

## 2011-06-30 NOTE — Progress Notes (Signed)
[facility] SITE 3 NUCLEAR MED [address] [place] [address] [phone]  Cardiology Nuclear Med Study  Desiree Floyd is a 78 y.o. female     MRN : [id number]     DOB: Mar 25, 1932  Procedure Date: 06/30/2011  Nuclear Med Background Indication for Stress Test:  Evaluation for Ischemia, and PTCA/Stent Patency  History:  '04 Lateral Wall Myocardial Infarction>Cath: EF=70%> PTCA/Stent of CFX, and '10 Myocardial Perfusion Study-NL, EF=91% Cardiac Risk Factors: Family History - CAD, Hypertension, Lipids, NIDDM and Smoker  Symptoms:  Dizziness and DOE   Nuclear Pre-Procedure Caffeine/Decaff Intake:  None NPO After: 7:00pm   Lungs:  clear O2 Sat: 97% on room air. IV 0.9% NS with Angio Cath:  22g  IV Site: R Forearm  IV Started by:  Stanton Kidney, EMT-P  Chest Size (in):  36 Cup Size: C  Height: 5\' 2"  (1.575 m)  Weight:  123 lb (55.792 kg)  BMI:  Body mass index is 22.50 kg/(m^2). Tech Comments:  Meds (lopressor) were taken as directed at 8:00am, per patient.    Nuclear Med Study 1 or 2 day study: 1 day  Stress Test Type:  Stress  Reading MD: Kristeen Miss, MD  Order Authorizing Provider:  Peter Swaziland, MD  Resting Radionuclide: Technetium 41m Tetrofosmin  Resting Radionuclide Dose: 10.4 mCi   Stress Radionuclide:  Technetium 27m Tetrofosmin  Stress Radionuclide Dose: 32.8 mCi           Stress Protocol Rest HR: 63 Stress HR: 111  Rest BP: 118/65 Stress BP: 184/65  Exercise Time (min): 10:31 METS: 9.8   Predicted Max HR: 144 bpm % Max HR: 77.08 bpm Rate Pressure Product: 21308   Dose of Adenosine (mg):  n/a Dose of Lexiscan: 0.4 mg  Dose of Atropine (mg): n/a Dose of Dobutamine: n/a mcg/kg/min (at max HR)  Stress Test Technologist: Irean Hong, RN  Nuclear Technologist:  Domenic Polite, CNMT     Rest Procedure:  Myocardial perfusion imaging was performed at rest 45 minutes following the intravenous administration of Technetium 18m  Tetrofosmin. Rest ECG: NSR with nonspecific T wave changes  Stress Procedure: The patient attempted to walk the treadmill utilizing the Bruce Protocol for 8 minutes and 30 seconds , but was unable to reach target heart rate due to DOE and fatique on beta blocker. The patient received IV Lexiscan 0.4 mg over 15-seconds with concurrent low level exercise.  Technetium 52m Tetrofosmin was injected at 30-seconds while the patient continued walking. There were nonspecific ST-T changes with exercise and with Lexiscan.There were frequent PAC's and PVC's. Quantitative spect images were obtained after a 45-minute delay. Stress ECG: No significant change from baseline ECG  QPS Raw Data Images:  Normal; no motion artifact; normal heart/lung ratio. Stress Images:  Normal homogeneous uptake in all areas of the myocardium. Rest Images:  Normal homogeneous uptake in all areas of the myocardium. Subtraction (SDS):  No evidence of ischemia. Transient Ischemic Dilatation (Normal <1.22):  1.00 Lung/Heart Ratio (Normal <0.45):  0.24  Quantitative Gated Spect Images QGS EDV:  38 ml QGS ESV:  4 ml  Impression Exercise Capacity:  Lexiscan with low level exercise. BP Response:  Normal blood pressure response. Clinical Symptoms:  No significant symptoms noted. ECG Impression:  No significant ST segment change suggestive of ischemia. Comparison with Prior Nuclear Study: No images to compare  Overall Impression:  Normal stress nuclear study.  LV Ejection Fraction: 90%.  LV Wall Motion:  NL LV Function; NL  Wall Motion    Vesta Mixer, Montez Hageman., MD, [facility] 06/30/2011, 4:07 PM Office - [phone] Pager [phone]

## 2011-10-29 ENCOUNTER — Other Ambulatory Visit: Payer: Self-pay

## 2011-10-29 ENCOUNTER — Other Ambulatory Visit: Payer: Self-pay | Admitting: Family Medicine

## 2011-10-29 MED ORDER — ALBUTEROL SULFATE HFA 108 (90 BASE) MCG/ACT IN AERS: 2.0000 | INHALATION_SPRAY | Freq: Four times a day (QID) | RESPIRATORY_TRACT | Status: DC | PRN

## 2011-10-29 NOTE — Telephone Encounter (Signed)
Rx cancelled with [facility] 

## 2011-10-29 NOTE — Telephone Encounter (Signed)
Pt said inhaler was not at [facility]; inhaler was sent to [facility]. I called [facility] spoke with [facility]; rx ready in 30-40 mins for pick up. Pt notified while on phone. Selena Batten will cancel [facility] order.

## 2011-11-01 ENCOUNTER — Other Ambulatory Visit: Payer: Self-pay | Admitting: Family Medicine

## 2011-11-01 DIAGNOSIS — Z1231 Encounter for screening mammogram for malignant neoplasm of breast: Secondary | ICD-10-CM

## 2011-12-16 ENCOUNTER — Ambulatory Visit
Admission: RE | Admit: 2011-12-16 | Discharge: 2011-12-16 | Disposition: A | Discharge status: Home / Self Care | Payer: Medicare Other | Source: Ambulatory Visit | Attending: Family Medicine | Admitting: Family Medicine

## 2011-12-16 ENCOUNTER — Encounter: Payer: Self-pay | Admitting: *Deleted

## 2011-12-16 DIAGNOSIS — Z1231 Encounter for screening mammogram for malignant neoplasm of breast: Secondary | ICD-10-CM

## 2012-02-09 DIAGNOSIS — C4402 Squamous cell carcinoma of skin of lip: Secondary | ICD-10-CM

## 2012-02-09 HISTORY — DX: Squamous cell carcinoma of skin of lip: C44.02

## 2012-02-09 HISTORY — PX: BASAL CELL CARCINOMA EXCISION: SHX1214

## 2012-02-24 ENCOUNTER — Ambulatory Visit (INDEPENDENT_AMBULATORY_CARE_PROVIDER_SITE_OTHER)
Admission: RE | Admit: 2012-02-24 | Discharge: 2012-02-24 | Disposition: A | Discharge status: Home / Self Care | Payer: Medicare Other | Source: Ambulatory Visit | Attending: Family Medicine | Admitting: Family Medicine

## 2012-02-24 ENCOUNTER — Ambulatory Visit (INDEPENDENT_AMBULATORY_CARE_PROVIDER_SITE_OTHER): Payer: Medicare Other | Admitting: Family Medicine

## 2012-02-24 ENCOUNTER — Encounter: Payer: Self-pay | Admitting: Family Medicine

## 2012-02-24 VITALS — BP 136/72 | HR 80 | Temp 98.0°F | Ht 61.0 in | Wt 124.0 lb

## 2012-02-24 DIAGNOSIS — I10 Essential (primary) hypertension: Secondary | ICD-10-CM

## 2012-02-24 DIAGNOSIS — E785 Hyperlipidemia, unspecified: Secondary | ICD-10-CM

## 2012-02-24 DIAGNOSIS — G4700 Insomnia, unspecified: Secondary | ICD-10-CM

## 2012-02-24 DIAGNOSIS — D7589 Other specified diseases of blood and blood-forming organs: Secondary | ICD-10-CM

## 2012-02-24 DIAGNOSIS — Z0000 Encounter for general adult medical examination without abnormal findings: Secondary | ICD-10-CM

## 2012-02-24 DIAGNOSIS — J449 Chronic obstructive pulmonary disease, unspecified: Secondary | ICD-10-CM

## 2012-02-24 DIAGNOSIS — E119 Type 2 diabetes mellitus without complications: Secondary | ICD-10-CM

## 2012-02-24 DIAGNOSIS — F17200 Nicotine dependence, unspecified, uncomplicated: Secondary | ICD-10-CM

## 2012-02-24 DIAGNOSIS — N63 Unspecified lump in unspecified breast: Secondary | ICD-10-CM

## 2012-02-24 MED ORDER — METOPROLOL TARTRATE 25 MG PO TABS: ORAL_TABLET | ORAL | Status: DC

## 2012-02-24 NOTE — Assessment & Plan Note (Signed)
Discussed sleep hygiene measures. Trazodone didn't really help. Recommended trial of melatonin.

## 2012-02-24 NOTE — Assessment & Plan Note (Signed)
I have personally reviewed the Medicare Annual Wellness questionnaire and have noted 1. The patient's medical and social history 2. Their use of alcohol, tobacco or illicit drugs 3. Their current medications and supplements 4. The patient's functional ability including ADL's, fall risks, home safety risks and hearing or visual impairment. 5. Diet and physical activity 6. Evidence for depression or mood disorders The patients weight, height, BMI have been recorded in the chart.  Hearing and vision has been addressed. I have made referrals, counseling and provided education to the patient based review of the above and I have provided the pt with a written personalized care plan for preventive services. See scanned questionairre. Advanced directives discussed: provided with packet of information.  Pt states would not want prolonged life support.  Reviewed preventative protocols and updated unless pt declined. UTD immunizations. CXR today for screening given lung findings and smoking hx.

## 2012-02-24 NOTE — Patient Instructions (Addendum)
Work on bedtime routine.  Try melatonin over the counter to help sleep. Chest xray today. Advanced directive packet provided today. Keep thinking about quitting smoking. Return in 1 year for next wellness exam or as needed. Good to see you today, call us with questions.  Insomnia Insomnia is frequent trouble falling and/or staying asleep. Insomnia can be a long term problem or a short term problem. Both are common. Insomnia can be a short term problem when the wakefulness is related to a certain stress or worry. Long term insomnia is often related to ongoing stress during waking hours and/or poor sleeping habits. Overtime, sleep deprivation itself can make the problem worse. Every little thing feels more severe because you are overtired and your ability to cope is decreased. CAUSES   Stress, anxiety, and depression.  Poor sleeping habits.  Distractions such as TV in the bedroom.  Naps close to bedtime.  Engaging in emotionally charged conversations before bed.  Technical reading before sleep.  Alcohol and other sedatives. They may make the problem worse. They can hurt normal sleep patterns and normal dream activity.  Stimulants such as caffeine for several hours prior to bedtime.  Pain syndromes and shortness of breath can cause insomnia.  Exercise late at night.  Changing time zones may cause sleeping problems (jet lag). It is sometimes helpful to have someone observe your sleeping patterns. They should look for periods of not breathing during the night (sleep apnea). They should also look to see how long those periods last. If you live alone or observers are uncertain, you can also be observed at a sleep clinic where your sleep patterns will be professionally monitored. Sleep apnea requires a checkup and treatment. Give your caregivers your medical history. Give your caregivers observations your family has made about your sleep.  SYMPTOMS   Not feeling rested in the  morning.  Anxiety and restlessness at bedtime.  Difficulty falling and staying asleep. TREATMENT   Your caregiver may prescribe treatment for an underlying medical disorders. Your caregiver can give advice or help if you are using alcohol or other drugs for self-medication. Treatment of underlying problems will usually eliminate insomnia problems.  Medications can be prescribed for short time use. They are generally not recommended for lengthy use.  Over-the-counter sleep medicines are not recommended for lengthy use. They can be habit forming.  You can promote easier sleeping by making lifestyle changes such as:  Using relaxation techniques that help with breathing and reduce muscle tension.  Exercising earlier in the day.  Changing your diet and the time of your last meal. No night time snacks.  Establish a regular time to go to bed.  Counseling can help with stressful problems and worry.  Soothing music and white noise may be helpful if there are background noises you cannot remove.  Stop tedious detailed work at least one hour before bedtime. HOME CARE INSTRUCTIONS   Keep a diary. Inform your caregiver about your progress. This includes any medication side effects. See your caregiver regularly. Take note of:  Times when you are asleep.  Times when you are awake during the night.  The quality of your sleep.  How you feel the next day. This information will help your caregiver care for you.  Get out of bed if you are still awake after 15 minutes. Read or do some quiet activity. Keep the lights down. Wait until you feel sleepy and go back to bed.  Keep regular sleeping and waking hours. Avoid naps.  Exercise regularly.  Avoid distractions at bedtime. Distractions include watching television or engaging in any intense or detailed activity like attempting to balance the household checkbook.  Develop a bedtime ritual. Keep a familiar routine of bathing, brushing your  teeth, climbing into bed at the same time each night, listening to soothing music. Routines increase the success of falling to sleep faster.  Use relaxation techniques. This can be using breathing and muscle tension release routines. It can also include visualizing peaceful scenes. You can also help control troubling or intruding thoughts by keeping your mind occupied with boring or repetitive thoughts like the old concept of counting sheep. You can make it more creative like imagining planting one beautiful flower after another in your backyard garden.  During your day, work to eliminate stress. When this is not possible use some of the previous suggestions to help reduce the anxiety that accompanies stressful situations. MAKE SURE YOU:   Understand these instructions.  Will watch your condition.  Will get help right away if you are not doing well or get worse. Document Released: 01/23/2000 Document Revised: 04/19/2011 Document Reviewed: 02/22/2007 [facility] Patient Information 2013 [place], [place].

## 2012-02-24 NOTE — Progress Notes (Signed)
Subjective:    Patient ID: Desiree Floyd, female    DOB: 1932-05-20, 79 y.o.   MRN: [id number]  HPI CC; medicare wellness visit  Continued trouble with insomnia - sleep maintenance issues.  Has tried ativan in past, self tapered off this because didn't really help.  Has also tried trazodone which may not have helped..  Discussed sleep hygiene.  No daytime naps.  Sees rheumatologist for gout.  smoking - 1 ppd.  Has tried chantix in past (caused vivid dreams).  Declines assistance.  Notes smoking helps calm nerves.  Not interested in discussing quitting currently.  Hearing screen - wears hearing aides.  Denies problems with this. Vision screen - R eye uses for far distance, L eye for close  No falls in last year.  Mood stable/no [place].  preventative:  No recent blood work - had blood work done at [facility]. flu [facility] 2013 tetanus shot - June 2011  PNA shot completed around 2009 Shingles shot 2013. Mammogram - Birads1 - Fall 2013 Cervical cancer - has aged out of pap smear.  Discussed self exams. colonoscopy - done 09/2005, normal per patient - will request records today. Advanced directives: has not discussed in past.  "i don't want to be put on machine".  Doesn't want prolonged life support.  Requests information today.  Medications and allergies reviewed and updated in chart.  Past histories reviewed and updated if relevant as below. Patient Active Problem List  Diagnosis  . HYPERLIPIDEMIA  . GOUT, UNSPECIFIED  . TOBACCO ABUSE  . HYPERTENSION  . COPD, MILD  . CATARACT EXTRACTIONS, BILATERAL, HX OF  . DM  . OTHER SPEC DISEASES BLOOD&BLOOD-FORMING ORGANS  . BREAST MASS, BENIGN  . CAD (coronary artery disease)  . COPD exacerbation  . Insomnia   Past Medical History  Diagnosis Date  . Hypertension   . Hyperlipidemia   . Diabetes mellitus 2012    A1c 6.8%  . CAD (coronary artery disease)     s/p stent 2004 [[place]][  . Tobacco abuse   . OA  (osteoarthritis)     of hands  . CPPD (pseudo-gout)   . COPD (chronic obstructive pulmonary disease)     not on meds, FEV1 39%, FVC 49%, ratio 0.59, not reversible w SABA (05/2008)  . Asthma   . Allergy   . Urge incontinence 2008    s/p urodynamics [Dahlstedt][  . Benign breast lumps     knows breast lumps, told scar tissue from implants  . Gout     Deveshwar/Landau  . PAC (premature atrial contraction)     chronic  . DJD (degenerative joint disease) of lumbar spine    Past Surgical History  Procedure Date  . Breast surgery   . Tonsillectomy   . Cataract extraction, bilateral 2010  . Coronary angioplasty with stent placement 2004  . Inner ear surgery     x2 for hearing loss,prosthesis implanted   History  Substance Use Topics  . Smoking status: Current Every Day Smoker -- 1.0 packs/day  . Smokeless tobacco: Not on file  . Alcohol Use: Yes   Family History  Problem Relation Age of Onset  . Heart disease Father 83    CAD AND HEART ATTACK  . Other Mother     old age, age 72   No Known Allergies Current Outpatient Prescriptions on File Prior to Visit  Medication Sig Dispense Refill  . albuterol (PROVENTIL HFA;VENTOLIN HFA) 108 (90 BASE) MCG/ACT inhaler Inhale 2 puffs into the lungs  every 6 (six) hours as needed for wheezing.  1 Inhaler  3  . allopurinol (ZYLOPRIM) 300 MG tablet Take 1 tablet (300 mg total) by mouth daily.  90 tablet  3  . amLODipine (NORVASC) 5 MG tablet Take 1 tablet (5 mg total) by mouth daily.  90 tablet  3  . aspirin 81 MG tablet Take 81 mg by mouth daily.        . Blood Glucose Monitoring Suppl (ONE TOUCH ULTRA SYSTEM KIT) W/DEVICE KIT 1 kit by Does not apply route once.        Marland Kitchen glucose blood (ONE TOUCH TEST STRIPS) test strip 1 each by Other route daily. Use as instructed       . Lancets (ONETOUCH ULTRASOFT) lancets 1 each by Other route daily. Use as instructed       . metoprolol tartrate (LOPRESSOR) 25 MG tablet Take 1/2 tab in am and 1/2 tab in  pm  90 tablet  3  . Omega-3 Fatty Acids (FISH OIL) 1000 MG CAPS Take 4 capsules by mouth daily.        . simvastatin (ZOCOR) 20 MG tablet Take 1 tablet (20 mg total) by mouth at bedtime.  90 tablet  3  . triamterene-hydrochlorothiazide (MAXZIDE-25) 37.5-25 MG per tablet Take 1 each (1 tablet total) by mouth daily.  90 tablet  3     Review of Systems  Constitutional: Negative for fever, chills, activity change, appetite change, fatigue and unexpected weight change.  HENT: Negative for hearing loss and neck pain.   Eyes: Negative for visual disturbance.  Respiratory: Positive for cough and wheezing. Negative for chest tightness and shortness of breath.   Cardiovascular: Negative for chest pain, palpitations and leg swelling.  Gastrointestinal: Negative for nausea, vomiting, abdominal pain, diarrhea, constipation, blood in stool and abdominal distention.  Genitourinary: Negative for hematuria and difficulty urinating.  Musculoskeletal: Negative for myalgias and arthralgias.  Skin: Negative for rash.  Neurological: Negative for dizziness, seizures, syncope and headaches.  Hematological: Does not bruise/bleed easily.  Psychiatric/Behavioral: Negative for dysphoric mood. The patient is not nervous/anxious.        Objective:  Physical Exam  Nursing note and vitals reviewed. Constitutional: She is oriented to person, place, and time. She appears well-developed and well-nourished. No distress.  HENT:  Head: Normocephalic and atraumatic.  Right Ear: Hearing, tympanic membrane, external ear and ear canal normal.  Left Ear: Hearing, tympanic membrane, external ear and ear canal normal.  Nose: Nose normal.  Mouth/Throat: Oropharynx is clear and moist. No oropharyngeal exudate.  Eyes: Conjunctivae normal and EOM are normal. Pupils are equal, round, and reactive to light. No scleral icterus.  Neck: Normal range of motion. Neck supple. Carotid bruit is not present. No thyromegaly present.    Cardiovascular: Normal rate, regular rhythm, normal heart sounds and intact distal pulses.   No murmur heard. Pulses:      Radial pulses are 2+ on the right side, and 2+ on the left side.  Pulmonary/Chest: Effort normal. No respiratory distress. She has no decreased breath sounds. She has wheezes (diffuse exp wheezing). She has rhonchi (bibasilar). She has no rales. Right breast exhibits no inverted nipple, no mass, no nipple discharge, no skin change and no tenderness. Left breast exhibits mass (firm mass around 9 o clock, known scar tissue per pt). Left breast exhibits no inverted nipple, no nipple discharge, no skin change and no tenderness.  Abdominal: Soft. Bowel sounds are normal. She exhibits no distension and no  mass. There is no tenderness. There is no rebound and no guarding.  Musculoskeletal: Normal range of motion. She exhibits no edema.  Lymphadenopathy:    She has no cervical adenopathy.    She has no axillary adenopathy.       Right axillary: No lateral adenopathy present.       Left axillary: No lateral adenopathy present.      Right: No supraclavicular adenopathy present.       Left: No supraclavicular adenopathy present.  Neurological: She is alert and oriented to person, place, and time.       CN grossly intact, station and gait intact  Skin: Skin is warm and dry. No rash noted.  Psychiatric: She has a normal mood and affect. Her behavior is normal. Judgment and thought content normal.       Assessment & Plan:

## 2012-02-24 NOTE — Assessment & Plan Note (Signed)
Will need to verify if has had spirometry in past - none in chart. continue to encourage smoking cessation.

## 2012-02-24 NOTE — Assessment & Plan Note (Signed)
Check A1c today to monitor diabetes - prior diet controlled.

## 2012-02-24 NOTE — Assessment & Plan Note (Signed)
Chronic, stable. Continue regimen. 

## 2012-02-24 NOTE — Assessment & Plan Note (Signed)
Again discussed reasons to quit and advised her to let me know if desires assistance with this. precontemplative.

## 2012-02-24 NOTE — Assessment & Plan Note (Signed)
Check today 

## 2012-02-24 NOTE — Assessment & Plan Note (Signed)
Per pt known breast mass - residual scar tissue.  Stable.

## 2012-02-25 LAB — CBC WITH DIFFERENTIAL/PLATELET
Basophils Absolute: 0 10*3/uL (ref 0.0–0.1)
Basophils Relative: 0.3 % (ref 0.0–3.0)
HCT: 47 % — ABNORMAL HIGH (ref 36.0–46.0)
Hemoglobin: 15.6 g/dL — ABNORMAL HIGH (ref 12.0–15.0)
Lymphocytes Relative: 29.2 % (ref 12.0–46.0)
Lymphs Abs: 3.7 10*3/uL (ref 0.7–4.0)
MCV: 101.8 fl — ABNORMAL HIGH (ref 78.0–100.0)
Monocytes Relative: 6.7 % (ref 3.0–12.0)
Neutro Abs: 8 10*3/uL — ABNORMAL HIGH (ref 1.4–7.7)
RBC: 4.62 Mil/uL (ref 3.87–5.11)
RDW: 15.2 % — ABNORMAL HIGH (ref 11.5–14.6)

## 2012-02-25 LAB — BASIC METABOLIC PANEL
BUN: 12 mg/dL (ref 6–23)
CO2: 29 mEq/L (ref 19–32)
Calcium: 9.9 mg/dL (ref 8.4–10.5)
Creatinine, Ser: 0.8 mg/dL (ref 0.4–1.2)

## 2012-02-25 LAB — LIPID PANEL
Cholesterol: 127 mg/dL (ref 0–200)
HDL: 43.1 mg/dL (ref >39.00)
LDL Cholesterol: 60 mg/dL (ref 0–99)
Total CHOL/HDL Ratio: 3
Triglycerides: 121 mg/dL (ref 0.0–149.0)
VLDL: 24.2 mg/dL (ref 0.0–40.0)

## 2012-02-25 LAB — MICROALBUMIN / CREATININE URINE RATIO: Microalb Creat Ratio: 0.7 mg/g (ref 0.0–30.0)

## 2012-02-25 LAB — HEMOGLOBIN A1C: Hgb A1c MFr Bld: 6.6 % — ABNORMAL HIGH (ref 4.6–6.5)

## 2012-03-16 ENCOUNTER — Other Ambulatory Visit: Payer: Self-pay | Admitting: Family Medicine

## 2012-04-10 ENCOUNTER — Ambulatory Visit (INDEPENDENT_AMBULATORY_CARE_PROVIDER_SITE_OTHER): Payer: Medicare Other | Admitting: Family Medicine

## 2012-04-10 ENCOUNTER — Ambulatory Visit: Payer: Medicare Other | Admitting: Family Medicine

## 2012-04-10 ENCOUNTER — Encounter: Payer: Self-pay | Admitting: Family Medicine

## 2012-04-10 ENCOUNTER — Telehealth: Payer: Self-pay | Admitting: Family Medicine

## 2012-04-10 VITALS — BP 144/70 | HR 96 | Temp 99.9°F | Wt 123.2 lb

## 2012-04-10 DIAGNOSIS — R062 Wheezing: Secondary | ICD-10-CM

## 2012-04-10 DIAGNOSIS — J449 Chronic obstructive pulmonary disease, unspecified: Secondary | ICD-10-CM

## 2012-04-10 DIAGNOSIS — J4489 Other specified chronic obstructive pulmonary disease: Secondary | ICD-10-CM

## 2012-04-10 DIAGNOSIS — J441 Chronic obstructive pulmonary disease with (acute) exacerbation: Secondary | ICD-10-CM | POA: Insufficient documentation

## 2012-04-10 DIAGNOSIS — F17200 Nicotine dependence, unspecified, uncomplicated: Secondary | ICD-10-CM

## 2012-04-10 MED ORDER — DOXYCYCLINE HYCLATE 100 MG PO CAPS: 100.0000 mg | ORAL_CAPSULE | Freq: Two times a day (BID) | ORAL | Status: DC

## 2012-04-10 MED ORDER — ALBUTEROL SULFATE (5 MG/ML) 0.5% IN NEBU
2.5000 mg | INHALATION_SOLUTION | Freq: Once | RESPIRATORY_TRACT | Status: AC
  Administered 2012-04-10: 2.5 mg via RESPIRATORY_TRACT

## 2012-04-10 MED ORDER — IPRATROPIUM BROMIDE 0.02 % IN SOLN
0.5000 mg | Freq: Once | RESPIRATORY_TRACT | Status: AC
  Administered 2012-04-10: 0.5 mg via RESPIRATORY_TRACT

## 2012-04-10 MED ORDER — PREDNISONE 20 MG PO TABS: ORAL_TABLET | ORAL | Status: DC

## 2012-04-10 MED ORDER — ALBUTEROL SULFATE (5 MG/ML) 0.5% IN NEBU
2.5000 mg | INHALATION_SOLUTION | Freq: Once | RESPIRATORY_TRACT | Status: DC
  Administered 2012-04-10: 2.5 mg via RESPIRATORY_TRACT

## 2012-04-10 NOTE — Assessment & Plan Note (Signed)
Mild as of last spirometry.  Due for rpt.  Concern for progression of disease.  rtc 6 wks for spirometry.

## 2012-04-10 NOTE — Patient Instructions (Addendum)
Return in 6 weeks for spirometry. You have COPD exacerbation. Treat with steroid course, albuterol inhaler as needed, and doxycycline course for 10 days. Please let me know if not improving as expected. If any sudden worsening or more trouble breathing, please go to ER. Try to abstain from tobacco.  Chronic Obstructive Pulmonary Disease Chronic obstructive pulmonary disease (COPD) is a condition in which airflow from the lungs is restricted. The lungs can never return to normal, but there are measures you can take which will improve them and make you feel better. CAUSES   Smoking.  Exposure to secondhand smoke.  Breathing in irritants (pollution, cigarette smoke, strong smells, aerosol sprays, paint fumes).  History of lung infections. TREATMENT  Treatment focuses on making you comfortable (supportive care). Your caregiver may prescribe medications (inhaled or pills) to help improve your breathing. HOME CARE INSTRUCTIONS   If you smoke, stop smoking.  Avoid exposure to smoke, chemicals, and fumes that aggravate your breathing.  Take antibiotic medicines as directed by your caregiver.  Avoid medicines that dry up your system and slow down the elimination of secretions (antihistamines and cough syrups). This decreases respiratory capacity and may lead to infections.  Drink enough water and fluids to keep your urine clear or pale yellow. This loosens secretions.  Use humidifiers at home and at your bedside if they do not make breathing difficult.  Receive all protective vaccines your caregiver suggests, especially pneumococcal and influenza.  Use home oxygen as suggested.  Stay active. Exercise and physical activity will help maintain your ability to do things you want to do.  Eat a healthy diet. SEEK MEDICAL CARE IF:   You develop pus-like mucus (sputum).  Breathing is more labored or exercise becomes difficult to do.  You are running out of the medicine you take for your  breathing. SEEK IMMEDIATE MEDICAL CARE IF:   You have a rapid heart rate.  You have agitation, confusion, tremors, or are in a stupor (family members may need to observe this).  It becomes difficult to breathe.  You develop chest pain.  You have a fever. MAKE SURE YOU:   Understand these instructions.  Will watch your condition.  Will get help right away if you are not doing well or get worse. Document Released: 11/04/2004 Document Revised: 04/19/2011 Document Reviewed: 03/27/2010 [facility] Patient Information 2013 [place], [place].

## 2012-04-10 NOTE — Progress Notes (Signed)
  Subjective:    Patient ID: Desiree Floyd, female    DOB: 03-16-1932, 79 y.o.   MRN: [id number]  HPI CC: cough  Smoker with h/o COPD.  3d h/o trouble breathing, dry cough leading to coughing fits, marked dyspnea, now getting rhinorrhea.  + wheezy.  Fever and chills.  HA.  So far using tylenol, albuterol inhaler, hot wash cloth on head. Trouble breathing yesterday to point of almost calling 911.  No abd pain, nausea.  Husband sick recently, treated for COPD exac last week. Continues smoking, but none in last 3 days. H/o asthma in past.  Past Medical History  Diagnosis Date  . Hypertension   . Hyperlipidemia   . Diabetes mellitus 2012    A1c 6.8%  . CAD (coronary artery disease)     s/p stent 2004 [[place]][  . Tobacco abuse   . OA (osteoarthritis)     of hands  . CPPD (pseudo-gout)   . COPD (chronic obstructive pulmonary disease)     not on meds, FEV1 39%, FVC 49%, ratio 0.59, not reversible w SABA (05/2008)  . Asthma   . Allergy   . Urge incontinence 2008    s/p urodynamics [Dahlstedt][  . Benign breast lumps     knows breast lumps, told scar tissue from implants  . Gout     Deveshwar/Landau  . PAC (premature atrial contraction)     chronic  . DJD (degenerative joint disease) of lumbar spine     Review of Systems Per HPI    Objective:   Physical Exam  Nursing note and vitals reviewed. Constitutional: She appears well-developed and well-nourished. No distress.  HENT:  Head: Normocephalic and atraumatic.  Mouth/Throat: Oropharynx is clear and moist. No oropharyngeal exudate.  Eyes: Conjunctivae and EOM are normal. Pupils are equal, round, and reactive to light.  Neck: Normal range of motion. Neck supple.  Cardiovascular: Normal rate, regular rhythm, normal heart sounds and intact distal pulses.   No murmur heard. Pulmonary/Chest: Accessory muscle usage present. Tachypnea noted. She is in respiratory distress. She has decreased breath sounds (tight  throughout). She has wheezes (diffuse coarse ins/exp wheezing). She has rhonchi. She has no rales.  After 1st alb/atrovent neb - improved air movement, still significant wheezing, improved WOB. After 2nd alb/atrovent neb - further improved WOB, no respiratory distress, insp/exp wheezing L>R, rhonchi  Lymphadenopathy:    She has no cervical adenopathy.  Skin: Skin is warm and dry. No rash noted.  Psychiatric: She has a normal mood and affect.   At rest without O2 = 89%.  Ambulatory ox off O2 = 87%. At rest with O2 = 97%.  Ambulatory ox with O2 = 93%     Assessment & Plan:

## 2012-04-10 NOTE — Telephone Encounter (Signed)
Patient Information:  Caller Name: Adiyah  Phone: [phone]  Patient: Desiree Floyd  Gender: Female  DOB: [birth year]  Age: 79 Years  PCP: Eustaquio Boyden [facility])  Office Follow Up:  Does the office need to follow up with this patient?: No  Instructions For The Office: N/A   Symptoms  Reason For Call & Symptoms: Frequent dry Cough, facial pain and pressure, Congestion onset- 04/06/12. Trouble sleeping. Chest feels tight. She is wheezing. Using Inhaler and helps some. Getting chills and feeling achy  Reviewed Health History In EMR: Yes  Reviewed Medications In EMR: Yes  Reviewed Allergies In EMR: Yes  Reviewed Surgeries / Procedures: Yes  Date of Onset of Symptoms: 04/06/2012  Treatments Tried: Delysym, Hall cough drops and Tylenol  Treatments Tried Worked: No  Guideline(s) Used:  Colds  Disposition Per Guideline:   See Today in Office  Reason For Disposition Reached:   Sinus pain (not just congestion) and fever  Advice Given:  Reassurance  It sounds like an uncomplicated cold that we can treat at home.  Colds are very common and may make you feel uncomfortable.  Colds are caused by viruses, and no medicine or "shot" will cure an uncomplicated cold.  Colds are usually not serious.  For a Stuffy Nose - Use Nasal Washes:  Introduction: Saline (salt water) nasal irrigation (nasal wash) is an effective and simple home remedy for treating stuffy nose and sinus congestion. The nose can be irrigated by pouring, spraying, or squirting salt water into the nose and then letting it run back out.  How it Helps: The salt water rinses out excess mucus, washes out any irritants (dust, allergens) that might be present, and moistens the nasal cavity.  Methods: There are several ways to perform nasal irrigation. You can use a saline nasal spray bottle (available over-the-counter), a rubber ear syringe, a medical syringe without the needle, or a Neti Pot.  Humidifier:  If the  air in your home is dry, use a cool-mist humidifier  Contagiousness:  The cold virus is present in your nasal secretions.  Cover your nose and mouth with a tissue when you sneeze or cough.  Wash your hands frequently with soap and water.  You can return to work or school after the fever is gone and you feel well enough to participate in normal activities.  Expected Course:   Fever may last 2-3 days  Nasal discharge 7-14 days  Cough up to 2-3 weeks.  Call Back If:  Difficulty breathing occurs  Fever lasts more than 3 days  Nasal discharge lasts more than 10 days  Cough lasts more than 3 weeks  You become worse

## 2012-04-10 NOTE — Telephone Encounter (Signed)
Patient is seeing Dr. Reece Agar today.

## 2012-04-10 NOTE — Assessment & Plan Note (Signed)
Continue to encourage cessation. 

## 2012-04-10 NOTE — Assessment & Plan Note (Signed)
No evidence of PNA on exam.   Anticipate URI/bronchitis which has led to marked COPD exacerbation After nebulizer treatment, improved breathing.  Overall maintains O2 with walk test in office so did not send home with O2. Treat with doxy, steroid course, and scheduled albuterol Discussed in detail reasons to seek urgent care.

## 2012-04-10 NOTE — Addendum Note (Signed)
Addended by: Shon Millet on: 04/10/2012 02:21 PM   Modules accepted: Orders

## 2012-05-05 ENCOUNTER — Other Ambulatory Visit: Payer: Self-pay | Admitting: *Deleted

## 2012-05-05 MED ORDER — ALLOPURINOL 300 MG PO TABS: 300.0000 mg | ORAL_TABLET | Freq: Every day | ORAL | Status: DC

## 2012-05-05 MED ORDER — SIMVASTATIN 20 MG PO TABS: 20.0000 mg | ORAL_TABLET | Freq: Every day | ORAL | Status: DC

## 2012-05-05 MED ORDER — AMLODIPINE BESYLATE 5 MG PO TABS: 5.0000 mg | ORAL_TABLET | Freq: Every day | ORAL | Status: DC

## 2012-05-05 MED ORDER — TRIAMTERENE-HCTZ 37.5-25 MG PO TABS: 1.0000 | ORAL_TABLET | Freq: Every day | ORAL | Status: DC

## 2012-05-19 ENCOUNTER — Encounter: Payer: Self-pay | Admitting: Cardiology

## 2012-06-15 ENCOUNTER — Encounter: Payer: Self-pay | Admitting: Family Medicine

## 2012-06-15 ENCOUNTER — Ambulatory Visit: Payer: Medicare Other | Admitting: Family Medicine

## 2012-06-15 ENCOUNTER — Ambulatory Visit (INDEPENDENT_AMBULATORY_CARE_PROVIDER_SITE_OTHER): Payer: Medicare Other | Admitting: Family Medicine

## 2012-06-15 VITALS — BP 148/80 | HR 72 | Temp 98.3°F | Wt 121.5 lb

## 2012-06-15 DIAGNOSIS — E119 Type 2 diabetes mellitus without complications: Secondary | ICD-10-CM

## 2012-06-15 DIAGNOSIS — I739 Peripheral vascular disease, unspecified: Secondary | ICD-10-CM

## 2012-06-15 DIAGNOSIS — J449 Chronic obstructive pulmonary disease, unspecified: Secondary | ICD-10-CM

## 2012-06-15 DIAGNOSIS — E1151 Type 2 diabetes mellitus with diabetic peripheral angiopathy without gangrene: Secondary | ICD-10-CM | POA: Insufficient documentation

## 2012-06-15 DIAGNOSIS — I10 Essential (primary) hypertension: Secondary | ICD-10-CM

## 2012-06-15 DIAGNOSIS — I491 Atrial premature depolarization: Secondary | ICD-10-CM | POA: Insufficient documentation

## 2012-06-15 DIAGNOSIS — F17200 Nicotine dependence, unspecified, uncomplicated: Secondary | ICD-10-CM

## 2012-06-15 MED ORDER — LOSARTAN POTASSIUM-HCTZ 50-12.5 MG PO TABS: 1.0000 | ORAL_TABLET | Freq: Every day | ORAL | Status: DC

## 2012-06-15 MED ORDER — LOSARTAN POTASSIUM 50 MG PO TABS: 50.0000 mg | ORAL_TABLET | Freq: Every day | ORAL | Status: DC

## 2012-06-15 NOTE — Progress Notes (Signed)
  Subjective:    Patient ID: Desiree Floyd, female    DOB: 1932/05/09, 79 y.o.   MRN: [id number]  HPI CC: discuss screening she recently underwent  Sister died from ruptured AAA 2011/12/05.    Pt had lif[facility] screening showing: Abd [place] negative for AAA. L ABI 0.96, R ABI mildly low at 0.81 ?afib on EKG. Mild plaque in carotid arteries. Bone density scan - Tscore -1.0, WNL  In process of making living will.  Due for rpt spirometry.  Not on controller med, only albuterol as needed.  Recent COPD exac. Declines further testing today, denies current dyspnea.  HTN - mildly elevated today.  Reports compliance with all meds. BP Readings from Last 3 Encounters:  06/15/12 148/80  04/10/12 144/70  02/24/12 136/72    Past Medical History  Diagnosis Date  . Hypertension   . Hyperlipidemia   . Diabetes mellitus 2012    A1c 6.8%  . CAD (coronary artery disease)     s/p stent 2004 [[place]][  . Tobacco abuse   . OA (osteoarthritis)     of hands  . CPPD (pseudo-gout)   . COPD (chronic obstructive pulmonary disease)     not on meds, FEV1 39%, FVC 49%, ratio 0.59, not reversible w SABA (05/2008)  . Asthma   . Allergy   . Urge incontinence 2008    s/p urodynamics [Dahlstedt][  . Benign breast lumps     knows breast lumps, told scar tissue from implants  . Gout     Deveshwar/Landau  . PAC (premature atrial contraction)     chronic  . DJD (degenerative joint disease) of lumbar spine     Review of Systems Per HPI     Objective:   Physical Exam  Nursing note and vitals reviewed. Constitutional: She appears well-developed and well-nourished. No distress.  HENT:  Head: Normocephalic and atraumatic.  Mouth/Throat: Oropharynx is clear and moist. No oropharyngeal exudate.  Eyes: Conjunctivae and EOM are normal. Pupils are equal, round, and reactive to light. No scleral icterus.  Cardiovascular: Normal rate, regular rhythm, normal heart sounds and intact distal pulses.   No murmur  heard. Pulses:      Dorsalis pedis pulses are 1+ on the right side, and 2+ on the left side.  Pulmonary/Chest: Effort normal. No respiratory distress. She has wheezes (faint). She has no rales.  coarse  Musculoskeletal: She exhibits no edema.  Diabetic foot exam: Normal inspection No calluses  Diminished DP/PT on right Normal sensation to light touch and monofilament Nails normal Raised lesion on left 2nd toe with rolling borders - rec f/u with derm (has appt in upcoming month)       Assessment & Plan:

## 2012-06-15 NOTE — Assessment & Plan Note (Signed)
Continue to encourage cessation. Contemplative. 

## 2012-06-15 NOTE — Patient Instructions (Addendum)
Schedule eye exam. Work on cutting down on smoking - best thing to do to slow progression of plaque buildup Finish taking maxzide (triamterene hctz) but no more refills of this medicine (call pharmacy to cancel script).  Instead, I want you to start losartan hctz 50/25mg . Return to see me a few weeks after starting new medicine. Return for lab visit 1 week after starting new medicine.

## 2012-06-15 NOTE — Assessment & Plan Note (Signed)
In h/o DM, discussed benefits of ACEI/ARB.  Pt states cough with ACEI. Will change maxzide to hyzaar. Pt has 3 mo of maxzide at home - will finish this med then start hyzaar - 1 mo prescription for hyzaar provided rtc 1 wk after starting hyzaar for lab visit - Cr check. rtc 3-4 wks after starting hyzaar for office visit. Pt expressed understanding of plan and all was written out for her in pt instructions.

## 2012-06-15 NOTE — Assessment & Plan Note (Signed)
Diet controlled.  Discussed addition of ARB - see above. Foot exam today. Encouraged schedule eye exam.

## 2012-06-15 NOTE — Assessment & Plan Note (Addendum)
Recent screening returned concerned for Afib (reportedly detected by 4 limb EKG with HR 89).  I reviewed tracing - there seems to be sinus rhythm with occasional PACs Known h/o chronic PACs. Pt denies sxs.  Today has regular rhythm. Will monitor for now, continue meds (aspirin and metoprolol). Reviewed latest EKG (10/2010). Recommended continued routine f/u with cards.

## 2012-06-15 NOTE — Assessment & Plan Note (Signed)
RLE with diminished pulses, abnormal ABI at 0.81. Pt is currently asxs. Discussed importance of treating modifiable risk factors (sugars, blood pressure, cholesterol, quitting smoking (pt precontemplative)) Given asxs, no indication for pletal.  Continue ASA, B blocker, start ARB when runs out of maxzide. Pt agrees with plan.

## 2012-06-15 NOTE — Assessment & Plan Note (Signed)
Due for spirometry - declines to undergo today.  Will encourage her to do next visit.

## 2012-08-16 ENCOUNTER — Encounter: Payer: Self-pay | Admitting: Cardiology

## 2012-08-16 ENCOUNTER — Ambulatory Visit (INDEPENDENT_AMBULATORY_CARE_PROVIDER_SITE_OTHER): Payer: Medicare Other | Admitting: Cardiology

## 2012-08-16 VITALS — BP 124/60 | HR 76 | Ht 62.0 in | Wt 120.0 lb

## 2012-08-16 DIAGNOSIS — I2510 Atherosclerotic heart disease of native coronary artery without angina pectoris: Secondary | ICD-10-CM

## 2012-08-16 DIAGNOSIS — I739 Peripheral vascular disease, unspecified: Secondary | ICD-10-CM

## 2012-08-16 DIAGNOSIS — E119 Type 2 diabetes mellitus without complications: Secondary | ICD-10-CM

## 2012-08-16 DIAGNOSIS — F17200 Nicotine dependence, unspecified, uncomplicated: Secondary | ICD-10-CM

## 2012-08-16 NOTE — Progress Notes (Signed)
Desiree Floyd Date of Birth: 1932-10-17   History of Present Illness: Desiree Floyd is seen today for followup visit. Desiree Floyd has a history of coronary disease with a remote lateral myocardial infarction in 2004 treated with stenting of the left circumflex coronary with a drug-eluting stent. Myoview study in May 2013 was normal. Desiree Floyd also has a history of dyslipidemia, diabetes mellitus, hypertension, and tobacco use. Desiree Floyd continues to smoke one pack per day. Desiree Floyd denies any chest pain or shortness of breath. Desiree Floyd did have lifeline screening this year which demonstrated mild carotid plaque without stenosis. Her rhythm was reviewed and demonstrated sinus rhythm with frequent PACs. There is no evidence of abdominal aortic aneurysm. Desiree Floyd did have evidence of peripheral arterial disease with ABIs of 0.96 on the left and 0.81 on the right. Desiree Floyd denies any claudication symptoms.  Current Outpatient Prescriptions on File Prior to Visit  Medication Sig Dispense Refill  . albuterol (PROVENTIL HFA;VENTOLIN HFA) 108 (90 BASE) MCG/ACT inhaler Inhale 2 puffs into the lungs every 6 (six) hours as needed for wheezing.  1 Inhaler  3  . allopurinol (ZYLOPRIM) 300 MG tablet Take 1 tablet (300 mg total) by mouth daily.  90 tablet  3  . amLODipine (NORVASC) 5 MG tablet Take 1 tablet (5 mg total) by mouth daily.  90 tablet  3  . aspirin 81 MG tablet Take 81 mg by mouth daily.        . metoprolol tartrate (LOPRESSOR) 25 MG tablet Take one-half tablet by  mouth in the morning and  take one-half tablet by  mouth in the evening  90 tablet  3  . Omega-3 Fatty Acids (FISH OIL) 1000 MG CAPS Take 4 capsules by mouth daily.        . simvastatin (ZOCOR) 20 MG tablet Take 1 tablet (20 mg total) by mouth at bedtime.  90 tablet  3  . Blood Glucose Monitoring Suppl (ONE TOUCH ULTRA SYSTEM KIT) W/DEVICE KIT 1 kit by Does not apply route once.        Marland Kitchen glucose blood (ONE TOUCH TEST STRIPS) test strip 1 each by Other route daily. Use as  instructed       . Lancets (ONETOUCH ULTRASOFT) lancets 1 each by Other route daily. Use as instructed        No current facility-administered medications on file prior to visit.    Allergies  Allergen Reactions  . Lisinopril Cough    Past Medical History  Diagnosis Date  . Hypertension   . Hyperlipidemia   . Diabetes mellitus 2012    diet controlled  . CAD (coronary artery disease) 2004    s/p stent [Jordan]  . Tobacco abuse   . OA (osteoarthritis)     of hands  . CPPD (pseudo-gout)   . COPD (chronic obstructive pulmonary disease)     not on meds, FEV1 39%, FVC 49%, ratio 0.59, not reversible w SABA (05/2008)  . Asthma   . Allergy   . Urge incontinence 2008    s/p urodynamics [Dahlstedt]  . Benign breast lumps     knows breast lumps, told scar tissue from implants  . Gout     Deveshwar/Landau  . PAC (premature atrial contraction)     chronic  . DJD (degenerative joint disease) of lumbar spine     Past Surgical History  Procedure Laterality Date  . Breast surgery    . Tonsillectomy    . Cataract extraction, bilateral  2010  . Coronary  angioplasty with stent placement  2004  . Inner ear surgery      x2 for hearing loss,prosthesis implanted    History  Smoking status  . Current Every Day Smoker -- 0.75 packs/day  Smokeless tobacco  . Not on file    History  Alcohol Use  . Yes    Family History  Problem Relation Age of Onset  . Heart disease Father 21    CAD AND HEART ATTACK  . Other Mother     old age, age 31    Review of Systems: As noted in history of present illness. All other systems were reviewed and are negative.  Physical Exam: BP 124/60  Pulse 76  Ht 5\' 2"  (1.575 m)  Wt 120 lb (54.432 kg)  BMI 21.94 kg/m2 Desiree Floyd is an elderly white female in no acute distress.The patient is alert and oriented x 3.    The skin is warm and dry.  Color is normal.  The HEENT exam is normal.  The carotids are 2+ without bruits.  There is no thyromegaly.  There  is no JVD.  The lungs are clear.  The chest wall is non tender.  The heart exam reveals a regular rate with a normal S1 and S2.  There are no murmurs, gallops, or rubs.  The PMI is not displaced.   Abdominal exam reveals good bowel sounds.  There is no guarding or rebound.  There is no hepatosplenomegaly or tenderness.  There are no masses.  Exam of the legs reveal no clubbing, cyanosis, or edema.  The distal pulses are intact.  Cranial nerves II - XII are intact.  Motor and sensory functions are intact.  The gait is normal.  LABORATORY DATA: Lab Results  Component Value Date   WBC 12.8* 02/24/2012   HGB 15.6* 02/24/2012   HCT 47.0* 02/24/2012   PLT 253.0 02/24/2012   GLUCOSE 114* 02/24/2012   CHOL 127 02/24/2012   TRIG 121.0 02/24/2012   HDL 43.10 02/24/2012   LDLCALC 60 02/24/2012   ALT 17 03/16/2011   AST 18 03/16/2011   NA 140 02/24/2012   K 3.9 02/24/2012   CL 99 02/24/2012   CREATININE 0.8 02/24/2012   BUN 12 02/24/2012   CO2 29 02/24/2012   TSH 1.57 03/16/2011   HGBA1C 6.6* 02/24/2012   MICROALBUR 0.7 02/24/2012     Assessment / Plan: 1. Coronary disease with remote stenting of the left circumflex coronary in 2004. Desiree Floyd remains asymptomatic. Myoview study one year ago was normal. We will continue therapy with aspirin, metoprolol, and amlodipine.  2. Tobacco dependence. Stressed the importance of smoking cessation. Patient states Desiree Floyd is going to think about it but is not yet ready to quit.  3. Peripheral arterial disease. Encouraged a regular walking program.  4. Diabetes mellitus type 2.  5. Hyperlipidemia well controlled on simvastatin.  6. Hypertension-controlled.

## 2012-08-16 NOTE — Patient Instructions (Signed)
Continue your medication  Walk more.  Quit smoking  I will see you in one year

## 2012-08-17 ENCOUNTER — Other Ambulatory Visit: Payer: Self-pay

## 2012-08-21 ENCOUNTER — Encounter: Payer: Self-pay | Admitting: Family Medicine

## 2012-08-21 ENCOUNTER — Ambulatory Visit (INDEPENDENT_AMBULATORY_CARE_PROVIDER_SITE_OTHER): Payer: Medicare Other | Admitting: Family Medicine

## 2012-08-21 VITALS — BP 110/70 | HR 84 | Temp 98.3°F | Wt 119.0 lb

## 2012-08-21 DIAGNOSIS — J441 Chronic obstructive pulmonary disease with (acute) exacerbation: Secondary | ICD-10-CM

## 2012-08-21 MED ORDER — PREDNISONE 20 MG PO TABS: ORAL_TABLET | ORAL | Status: DC

## 2012-08-21 MED ORDER — ALBUTEROL SULFATE HFA 108 (90 BASE) MCG/ACT IN AERS: 2.0000 | INHALATION_SPRAY | Freq: Four times a day (QID) | RESPIRATORY_TRACT | Status: DC | PRN

## 2012-08-21 MED ORDER — AMOXICILLIN-POT CLAVULANATE 875-125 MG PO TABS: 1.0000 | ORAL_TABLET | Freq: Two times a day (BID) | ORAL | Status: AC

## 2012-08-21 NOTE — Assessment & Plan Note (Signed)
No recent spirometry - consider in near future as well as daily controller med.  Cost is an issue. Treat with steroid course.  If not improving, fill augmentin. Continue albuterol as needed. Pt agrees with plan.

## 2012-08-21 NOTE — Progress Notes (Signed)
  Subjective:    Patient ID: Desiree Floyd, female    DOB: [birth year], 79 y.o.   MRN: [id number]  HPI CC: cough  Dry cough for last 4 days, associated with chills, night sweats, and aches.  For last 2 days, R nose bleeds when blowing nose.  Resolved with holding pressure to nose.  This morning is feeling some better.  Slight frontal headache.  Drinking plenty of fluid and using tylenol.   At baseline feels dyspneic with exertion.  Only respiratory medication is albuterol.  Denies ear or tooth pain, ST, nasal or chest congestion.    Continued smoker - 3/4 ppd. Husband sick recently.  Past Medical History  Diagnosis Date  . Hypertension   . Hyperlipidemia   . Diabetes mellitus 2012    diet controlled  . CAD (coronary artery disease) 2004    s/p stent [Jordan]  . Tobacco abuse   . OA (osteoarthritis)     of hands  . CPPD (pseudo-gout)   . COPD (chronic obstructive pulmonary disease)     not on meds, FEV1 39%, FVC 49%, ratio 0.59, not reversible w SABA (05/2008)  . Asthma   . Allergy   . Urge incontinence 2008    s/p urodynamics [Dahlstedt]  . Benign breast lumps     knows breast lumps, told scar tissue from implants  . Gout     Deveshwar/Landau  . PAC (premature atrial contraction)     chronic  . DJD (degenerative joint disease) of lumbar spine      Review of Systems Per HPI    Objective:   Physical Exam  Nursing note and vitals reviewed. Constitutional: She appears well-developed and well-nourished. No distress.  HENT:  Head: Normocephalic and atraumatic.  Right Ear: Hearing, tympanic membrane, external ear and ear canal normal.  Left Ear: Hearing, tympanic membrane, external ear and ear canal normal.  Nose: No mucosal edema or rhinorrhea. Right sinus exhibits no maxillary sinus tenderness and no frontal sinus tenderness. Left sinus exhibits no maxillary sinus tenderness and no frontal sinus tenderness.  Mouth/Throat: Uvula is midline, oropharynx is clear and  moist and mucous membranes are normal. No oropharyngeal exudate, posterior oropharyngeal edema, posterior oropharyngeal erythema or tonsillar abscesses.  Eyes: Conjunctivae and EOM are normal. Pupils are equal, round, and reactive to light. No scleral icterus.  Neck: Normal range of motion. Neck supple.  Cardiovascular: Normal rate, regular rhythm, normal heart sounds and intact distal pulses.   No murmur heard. Pulmonary/Chest: Effort normal. No respiratory distress. She has wheezes (mild). She has rhonchi (diffuse exp). She has no rales.  Lymphadenopathy:    She has no cervical adenopathy.  Skin: Skin is warm and dry. No rash noted.       Assessment & Plan:

## 2012-08-21 NOTE — Patient Instructions (Signed)
I do think you have COPD flare - take prednisone course - watch carb intake while on steroids because they will increase sugars. Antibiotic to hold on to in case not improving with steroids. You may benefit from a daily COPD medicine - we will talk about this in future. Good to see you today, call us with questions or if not improving as expected.

## 2012-08-24 ENCOUNTER — Other Ambulatory Visit: Payer: Self-pay

## 2012-08-24 MED ORDER — GLUCOSE BLOOD VI STRP: ORAL_STRIP | Status: DC

## 2012-08-24 NOTE — Telephone Encounter (Signed)
Pt request refill one touch test strips to [facility].advised done.

## 2012-10-10 ENCOUNTER — Other Ambulatory Visit: Payer: Medicare Other

## 2012-10-11 ENCOUNTER — Other Ambulatory Visit (INDEPENDENT_AMBULATORY_CARE_PROVIDER_SITE_OTHER): Payer: Medicare Other

## 2012-10-11 DIAGNOSIS — E119 Type 2 diabetes mellitus without complications: Secondary | ICD-10-CM

## 2012-10-11 DIAGNOSIS — I10 Essential (primary) hypertension: Secondary | ICD-10-CM

## 2012-10-11 LAB — BASIC METABOLIC PANEL
BUN: 13 mg/dL (ref 6–23)
Calcium: 8.9 mg/dL (ref 8.4–10.5)
GFR: 82 mL/min (ref >60.00)
Glucose, Bld: 207 mg/dL — ABNORMAL HIGH (ref 70–99)
Potassium: 3.5 mEq/L (ref 3.5–5.1)

## 2012-10-11 LAB — HEMOGLOBIN A1C: Hgb A1c MFr Bld: 6.5 % (ref 4.6–6.5)

## 2012-10-13 ENCOUNTER — Other Ambulatory Visit: Payer: Self-pay | Admitting: Family Medicine

## 2012-11-06 ENCOUNTER — Encounter: Payer: Self-pay | Admitting: Family Medicine

## 2012-11-06 ENCOUNTER — Ambulatory Visit (INDEPENDENT_AMBULATORY_CARE_PROVIDER_SITE_OTHER): Payer: Medicare Other | Admitting: Family Medicine

## 2012-11-06 VITALS — BP 130/66 | HR 68 | Temp 98.4°F | Wt 122.5 lb

## 2012-11-06 DIAGNOSIS — J449 Chronic obstructive pulmonary disease, unspecified: Secondary | ICD-10-CM

## 2012-11-06 DIAGNOSIS — F17200 Nicotine dependence, unspecified, uncomplicated: Secondary | ICD-10-CM

## 2012-11-06 DIAGNOSIS — E119 Type 2 diabetes mellitus without complications: Secondary | ICD-10-CM

## 2012-11-06 DIAGNOSIS — E785 Hyperlipidemia, unspecified: Secondary | ICD-10-CM

## 2012-11-06 DIAGNOSIS — I10 Essential (primary) hypertension: Secondary | ICD-10-CM

## 2012-11-06 MED ORDER — LOSARTAN POTASSIUM-HCTZ 50-12.5 MG PO TABS: 1.0000 | ORAL_TABLET | Freq: Every day | ORAL | Status: DC

## 2012-11-06 NOTE — Assessment & Plan Note (Signed)
Chronic, stable. Continue ARB 

## 2012-11-06 NOTE — Progress Notes (Signed)
  Subjective:    Patient ID: Desiree Floyd, female    DOB: 09/01/1932, 79 y.o.   MRN: [id number]  HPI CC: f/u  HTN - No HA, vision changes, CP/tightness, leg swelling. Occasional dyspnea from smoking.  Compliant with 3 meds she's on.  HLD - compliant with simvastatin and fish oil 4 tabs daily.  DM - diet controlled.  Does check sugars at home intermittently (3x per week).  Tries to follow diabetic diet.  No recent eye exam. Lab Results  Component Value Date   HGBA1C 6.5 10/11/2012    Cough - present for several weeks.  No fevers.  Mild sputum production.  No increased dyspnea.  Occasional yellow drainage from nose.  L epistaxis for last few weeks that stopped on its own.  Wonders about holding off on flu shot 2/2 cough.  Smoking - 3/4 ppd.  precontemplative.  Past Medical History  Diagnosis Date  . Hypertension   . Hyperlipidemia   . Diabetes mellitus 2012    diet controlled  . CAD (coronary artery disease) 2004    s/p stent [Jordan]  . Tobacco abuse   . OA (osteoarthritis)     of hands  . CPPD (pseudo-gout)   . COPD (chronic obstructive pulmonary disease)     not on meds, FEV1 39%, FVC 49%, ratio 0.59, not reversible w SABA (05/2008)  . Asthma   . Allergy   . Urge incontinence 2008    s/p urodynamics [Dahlstedt]  . Benign breast lumps     knows breast lumps, told scar tissue from implants  . Gout     Deveshwar/Landau  . PAC (premature atrial contraction)     chronic  . DJD (degenerative joint disease) of lumbar spine     Review of Systems Per HPI    Objective:   Physical Exam  Nursing note and vitals reviewed. Constitutional: She appears well-developed and well-nourished. No distress.  HENT:  Head: Normocephalic and atraumatic.  Right Ear: Hearing, tympanic membrane, external ear and ear canal normal.  Left Ear: Hearing, tympanic membrane, external ear and ear canal normal.  Nose: Nose normal. No mucosal edema or rhinorrhea. Right sinus exhibits no maxillary  sinus tenderness and no frontal sinus tenderness. Left sinus exhibits no maxillary sinus tenderness and no frontal sinus tenderness.  Mouth/Throat: Uvula is midline, oropharynx is clear and moist and mucous membranes are normal. No oropharyngeal exudate, posterior oropharyngeal edema, posterior oropharyngeal erythema or tonsillar abscesses.  No evident lesions in nares  Eyes: Conjunctivae and EOM are normal. Pupils are equal, round, and reactive to light. No scleral icterus.  Neck: Normal range of motion. Neck supple.  Cardiovascular: Normal rate, regular rhythm, normal heart sounds and intact distal pulses.   No murmur heard. Pulmonary/Chest: Effort normal and breath sounds normal. No respiratory distress. She has no wheezes. She has no rales.  Coarse throughout, RLL   Musculoskeletal: She exhibits no edema.  Diabetic foot exam: Normal inspection No skin breakdown No calluses  Normal DP/PT pulses Normal sensation to light tough and monofilament Nails normal  Lymphadenopathy:    She has no cervical adenopathy.  Skin: Skin is warm and dry. No rash noted.  Psychiatric: She has a normal mood and affect.       Assessment & Plan:

## 2012-11-06 NOTE — Assessment & Plan Note (Signed)
Will need spirometry next visit. Cough - rec albuterol regular for next few days. No evidence of active infection today. Ok for flu shot. Pt knows to return if worsening cough.

## 2012-11-06 NOTE — Assessment & Plan Note (Signed)
Continue simvastatin - tolerating. Lab Results  Component Value Date   LDLCALC 60 02/24/2012

## 2012-11-06 NOTE — Assessment & Plan Note (Signed)
Continue to encourage cessation. 

## 2012-11-06 NOTE — Assessment & Plan Note (Signed)
Diet controlled.  Stable.  Foot exam today. rec schedule eye exam.

## 2012-11-06 NOTE — Patient Instructions (Signed)
Schedule eye exam. May receive flu shot at [facility]. Use albuterol regularly for next 2-3 days Return in 4-5 months for spirometry visit. Continue thinking about cutting back on cigarettes Good to see you today, call us with questions.

## 2012-11-22 ENCOUNTER — Other Ambulatory Visit: Payer: Self-pay

## 2012-11-22 DIAGNOSIS — Z1231 Encounter for screening mammogram for malignant neoplasm of breast: Secondary | ICD-10-CM

## 2012-12-05 ENCOUNTER — Other Ambulatory Visit: Payer: Self-pay

## 2012-12-05 MED ORDER — LOSARTAN POTASSIUM-HCTZ 50-12.5 MG PO TABS: 1.0000 | ORAL_TABLET | Freq: Every day | ORAL | Status: DC

## 2012-12-05 NOTE — Telephone Encounter (Signed)
Pt request refill losartan HCTZ 50-12.5 mg to [facility]. Advised done.

## 2012-12-06 ENCOUNTER — Other Ambulatory Visit: Payer: Self-pay | Admitting: Family Medicine

## 2012-12-25 ENCOUNTER — Other Ambulatory Visit: Payer: Self-pay

## 2012-12-25 ENCOUNTER — Ambulatory Visit
Admission: RE | Admit: 2012-12-25 | Discharge: 2012-12-25 | Disposition: A | Discharge status: Home / Self Care | Payer: Medicare Other | Source: Ambulatory Visit

## 2012-12-25 DIAGNOSIS — Z1231 Encounter for screening mammogram for malignant neoplasm of breast: Secondary | ICD-10-CM

## 2012-12-26 ENCOUNTER — Encounter: Payer: Self-pay | Admitting: *Deleted

## 2013-02-01 ENCOUNTER — Encounter (HOSPITAL_COMMUNITY): Payer: Self-pay | Admitting: Emergency Medicine

## 2013-02-01 ENCOUNTER — Emergency Department (HOSPITAL_COMMUNITY): Payer: Medicare Other

## 2013-02-01 ENCOUNTER — Inpatient Hospital Stay (HOSPITAL_COMMUNITY)
Admission: EM | Admit: 2013-02-01 | Discharge: 2013-02-03 | DRG: 189 | Disposition: A | Discharge status: Home / Self Care | Payer: Medicare Other | Attending: Internal Medicine | Admitting: Internal Medicine

## 2013-02-01 DIAGNOSIS — E876 Hypokalemia: Secondary | ICD-10-CM

## 2013-02-01 DIAGNOSIS — J441 Chronic obstructive pulmonary disease with (acute) exacerbation: Secondary | ICD-10-CM | POA: Diagnosis present

## 2013-02-01 DIAGNOSIS — J449 Chronic obstructive pulmonary disease, unspecified: Secondary | ICD-10-CM

## 2013-02-01 DIAGNOSIS — Z79899 Other long term (current) drug therapy: Secondary | ICD-10-CM

## 2013-02-01 DIAGNOSIS — J9600 Acute respiratory failure, unspecified whether with hypoxia or hypercapnia: Principal | ICD-10-CM | POA: Diagnosis present

## 2013-02-01 DIAGNOSIS — I10 Essential (primary) hypertension: Secondary | ICD-10-CM | POA: Diagnosis present

## 2013-02-01 DIAGNOSIS — R0602 Shortness of breath: Secondary | ICD-10-CM

## 2013-02-01 DIAGNOSIS — I2510 Atherosclerotic heart disease of native coronary artery without angina pectoris: Secondary | ICD-10-CM | POA: Diagnosis present

## 2013-02-01 DIAGNOSIS — E119 Type 2 diabetes mellitus without complications: Secondary | ICD-10-CM

## 2013-02-01 DIAGNOSIS — F17200 Nicotine dependence, unspecified, uncomplicated: Secondary | ICD-10-CM | POA: Diagnosis present

## 2013-02-01 DIAGNOSIS — Z7982 Long term (current) use of aspirin: Secondary | ICD-10-CM

## 2013-02-01 DIAGNOSIS — K219 Gastro-esophageal reflux disease without esophagitis: Secondary | ICD-10-CM

## 2013-02-01 DIAGNOSIS — E1169 Type 2 diabetes mellitus with other specified complication: Secondary | ICD-10-CM | POA: Diagnosis present

## 2013-02-01 DIAGNOSIS — E118 Type 2 diabetes mellitus with unspecified complications: Secondary | ICD-10-CM | POA: Diagnosis present

## 2013-02-01 DIAGNOSIS — E785 Hyperlipidemia, unspecified: Secondary | ICD-10-CM | POA: Diagnosis present

## 2013-02-01 HISTORY — DX: Shortness of breath: R06.02

## 2013-02-01 HISTORY — DX: Acute myocardial infarction, unspecified: I21.9

## 2013-02-01 LAB — CBC WITH DIFFERENTIAL/PLATELET
Basophils Absolute: 0 10*3/uL (ref 0.0–0.1)
Basophils Relative: 0 % (ref 0–1)
Eosinophils Absolute: 0 10*3/uL (ref 0.0–0.7)
Eosinophils Relative: 0 % (ref 0–5)
MCH: 34.4 pg — ABNORMAL HIGH (ref 26.0–34.0)
MCHC: 35 g/dL (ref 30.0–36.0)
MCV: 98.3 fL (ref 78.0–100.0)
Neutrophils Relative %: 62 % (ref 43–77)
Platelets: 152 10*3/uL (ref 150–400)
RBC: 4.24 MIL/uL (ref 3.87–5.11)
RDW: 14 % (ref 11.5–15.5)

## 2013-02-01 LAB — CBC
HCT: 40.5 % (ref 36.0–46.0)
Hemoglobin: 13.8 g/dL (ref 12.0–15.0)
MCH: 34.1 pg — ABNORMAL HIGH (ref 26.0–34.0)
MCHC: 34.1 g/dL (ref 30.0–36.0)
MCV: 100 fL (ref 78.0–100.0)
RDW: 14.1 % (ref 11.5–15.5)

## 2013-02-01 LAB — COMPREHENSIVE METABOLIC PANEL
ALT: 23 U/L (ref 0–35)
AST: 28 U/L (ref 0–37)
Albumin: 3.3 g/dL — ABNORMAL LOW (ref 3.5–5.2)
Alkaline Phosphatase: 75 U/L (ref 39–117)
Calcium: 8.7 mg/dL (ref 8.4–10.5)
GFR calc Af Amer: >90 mL/min (ref >90)
Glucose, Bld: 162 mg/dL — ABNORMAL HIGH (ref 70–99)
Potassium: 2.8 mEq/L — ABNORMAL LOW (ref 3.5–5.1)
Sodium: 138 mEq/L (ref 135–145)
Total Protein: 7.1 g/dL (ref 6.0–8.3)

## 2013-02-01 LAB — POCT I-STAT TROPONIN I: Troponin i, poc: 0.01 ng/mL (ref 0.00–0.08)

## 2013-02-01 LAB — CREATININE, SERUM: GFR calc non Af Amer: 60 mL/min — ABNORMAL LOW (ref >90)

## 2013-02-01 LAB — GLUCOSE, CAPILLARY: Glucose-Capillary: 331 mg/dL — ABNORMAL HIGH (ref 70–99)

## 2013-02-01 MED ORDER — ACETAMINOPHEN 650 MG RE SUPP: 650.0000 mg | Freq: Four times a day (QID) | RECTAL | Status: DC | PRN

## 2013-02-01 MED ORDER — ALBUTEROL SULFATE (5 MG/ML) 0.5% IN NEBU: 2.5000 mg | INHALATION_SOLUTION | RESPIRATORY_TRACT | Status: DC | PRN

## 2013-02-01 MED ORDER — INSULIN ASPART 100 UNIT/ML ~~LOC~~ SOLN
4.0000 [IU] | Freq: Three times a day (TID) | SUBCUTANEOUS | Status: DC
  Administered 2013-02-01 – 2013-02-03 (×5): 4 [IU] via SUBCUTANEOUS

## 2013-02-01 MED ORDER — ALBUTEROL (5 MG/ML) CONTINUOUS INHALATION SOLN
15.0000 mg/h | INHALATION_SOLUTION | RESPIRATORY_TRACT | Status: DC
  Administered 2013-02-01: 15 mg/h via RESPIRATORY_TRACT
  Filled 2013-02-01: qty 20

## 2013-02-01 MED ORDER — LOSARTAN POTASSIUM-HCTZ 50-12.5 MG PO TABS: 1.0000 | ORAL_TABLET | Freq: Every day | ORAL | Status: DC

## 2013-02-01 MED ORDER — INSULIN ASPART 100 UNIT/ML ~~LOC~~ SOLN
0.0000 [IU] | Freq: Three times a day (TID) | SUBCUTANEOUS | Status: DC
  Administered 2013-02-01: 11 [IU] via SUBCUTANEOUS
  Administered 2013-02-02: 2 [IU] via SUBCUTANEOUS
  Administered 2013-02-02: 3 [IU] via SUBCUTANEOUS
  Administered 2013-02-03: 2 [IU] via SUBCUTANEOUS

## 2013-02-01 MED ORDER — SENNA 8.6 MG PO TABS
1.0000 | ORAL_TABLET | Freq: Two times a day (BID) | ORAL | Status: DC
  Administered 2013-02-01 – 2013-02-02 (×3): 8.6 mg via ORAL
  Filled 2013-02-01 (×6): qty 1

## 2013-02-01 MED ORDER — POTASSIUM CHLORIDE 10 MEQ/100ML IV SOLN
10.0000 meq | INTRAVENOUS | Status: DC
  Administered 2013-02-01: 10 meq via INTRAVENOUS
  Filled 2013-02-01 (×2): qty 100

## 2013-02-01 MED ORDER — ACETAMINOPHEN 500 MG PO TABS: 1000.0000 mg | ORAL_TABLET | Freq: Four times a day (QID) | ORAL | Status: DC | PRN

## 2013-02-01 MED ORDER — TIOTROPIUM BROMIDE MONOHYDRATE 18 MCG IN CAPS
18.0000 ug | ORAL_CAPSULE | Freq: Every day | RESPIRATORY_TRACT | Status: DC
  Administered 2013-02-01 – 2013-02-03 (×3): 18 ug via RESPIRATORY_TRACT
  Filled 2013-02-01: qty 5

## 2013-02-01 MED ORDER — ACETAMINOPHEN 325 MG PO TABS: 650.0000 mg | ORAL_TABLET | Freq: Four times a day (QID) | ORAL | Status: DC | PRN

## 2013-02-01 MED ORDER — LOSARTAN POTASSIUM 50 MG PO TABS
50.0000 mg | ORAL_TABLET | Freq: Every day | ORAL | Status: DC
  Administered 2013-02-01 – 2013-02-02 (×2): 50 mg via ORAL
  Filled 2013-02-01 (×3): qty 1

## 2013-02-01 MED ORDER — SODIUM CHLORIDE 0.9 % IV SOLN: 250.0000 mL | INTRAVENOUS | Status: DC | PRN

## 2013-02-01 MED ORDER — AMLODIPINE BESYLATE 5 MG PO TABS
5.0000 mg | ORAL_TABLET | Freq: Every day | ORAL | Status: DC
  Administered 2013-02-01 – 2013-02-02 (×2): 5 mg via ORAL
  Filled 2013-02-01 (×3): qty 1

## 2013-02-01 MED ORDER — SODIUM CHLORIDE 0.9 % IJ SOLN
3.0000 mL | Freq: Two times a day (BID) | INTRAMUSCULAR | Status: DC
  Administered 2013-02-02: 3 mL via INTRAVENOUS

## 2013-02-01 MED ORDER — HYDROCHLOROTHIAZIDE 12.5 MG PO CAPS
12.5000 mg | ORAL_CAPSULE | Freq: Every day | ORAL | Status: DC
  Administered 2013-02-01 – 2013-02-02 (×2): 12.5 mg via ORAL
  Filled 2013-02-01 (×3): qty 1

## 2013-02-01 MED ORDER — ONDANSETRON HCL 4 MG/2ML IJ SOLN: 4.0000 mg | Freq: Four times a day (QID) | INTRAMUSCULAR | Status: DC | PRN

## 2013-02-01 MED ORDER — METOPROLOL TARTRATE 12.5 MG HALF TABLET
12.5000 mg | ORAL_TABLET | Freq: Two times a day (BID) | ORAL | Status: DC
  Administered 2013-02-01 – 2013-02-02 (×3): 12.5 mg via ORAL
  Filled 2013-02-01 (×7): qty 1

## 2013-02-01 MED ORDER — METHYLPREDNISOLONE SODIUM SUCC 125 MG IJ SOLR
80.0000 mg | Freq: Three times a day (TID) | INTRAMUSCULAR | Status: DC
  Administered 2013-02-01 – 2013-02-03 (×6): 80 mg via INTRAVENOUS
  Filled 2013-02-01 (×9): qty 1.28

## 2013-02-01 MED ORDER — ALBUTEROL SULFATE (5 MG/ML) 0.5% IN NEBU
2.5000 mg | INHALATION_SOLUTION | Freq: Four times a day (QID) | RESPIRATORY_TRACT | Status: DC
  Administered 2013-02-01 – 2013-02-03 (×8): 2.5 mg via RESPIRATORY_TRACT
  Filled 2013-02-01 (×8): qty 0.5

## 2013-02-01 MED ORDER — ONDANSETRON HCL 4 MG PO TABS: 4.0000 mg | ORAL_TABLET | Freq: Four times a day (QID) | ORAL | Status: DC | PRN

## 2013-02-01 MED ORDER — DOXYCYCLINE HYCLATE 100 MG IV SOLR
100.0000 mg | Freq: Two times a day (BID) | INTRAVENOUS | Status: DC
  Administered 2013-02-01 – 2013-02-02 (×2): 100 mg via INTRAVENOUS
  Filled 2013-02-01 (×3): qty 100

## 2013-02-01 MED ORDER — ALLOPURINOL 300 MG PO TABS
300.0000 mg | ORAL_TABLET | Freq: Every day | ORAL | Status: DC
  Administered 2013-02-01 – 2013-02-02 (×2): 300 mg via ORAL
  Filled 2013-02-01 (×3): qty 1

## 2013-02-01 MED ORDER — INSULIN ASPART 100 UNIT/ML ~~LOC~~ SOLN: 0.0000 [IU] | Freq: Every day | SUBCUTANEOUS | Status: DC

## 2013-02-01 MED ORDER — HEPARIN SODIUM (PORCINE) 5000 UNIT/ML IJ SOLN
5000.0000 [IU] | Freq: Three times a day (TID) | INTRAMUSCULAR | Status: DC
  Administered 2013-02-01 – 2013-02-03 (×6): 5000 [IU] via SUBCUTANEOUS
  Filled 2013-02-01 (×9): qty 1

## 2013-02-01 MED ORDER — POTASSIUM CHLORIDE CRYS ER 20 MEQ PO TBCR
40.0000 meq | EXTENDED_RELEASE_TABLET | Freq: Once | ORAL | Status: AC
  Administered 2013-02-01: 40 meq via ORAL
  Filled 2013-02-01: qty 2

## 2013-02-01 MED ORDER — SODIUM CHLORIDE 0.9 % IJ SOLN: 3.0000 mL | INTRAMUSCULAR | Status: DC | PRN

## 2013-02-01 MED ORDER — SIMVASTATIN 20 MG PO TABS
20.0000 mg | ORAL_TABLET | Freq: Every day | ORAL | Status: DC
  Administered 2013-02-01 – 2013-02-02 (×2): 20 mg via ORAL
  Filled 2013-02-01 (×3): qty 1

## 2013-02-01 MED ORDER — ASPIRIN 81 MG PO CHEW
81.0000 mg | CHEWABLE_TABLET | Freq: Every day | ORAL | Status: DC
  Administered 2013-02-01 – 2013-02-02 (×2): 81 mg via ORAL
  Filled 2013-02-01 (×3): qty 1

## 2013-02-01 MED ORDER — SODIUM CHLORIDE 0.9 % IJ SOLN
3.0000 mL | Freq: Two times a day (BID) | INTRAMUSCULAR | Status: DC
  Administered 2013-02-01: 3 mL via INTRAVENOUS

## 2013-02-01 NOTE — ED Provider Notes (Signed)
CSN: [id number]     Arrival date & time 02/01/13  9562 History   First MD Initiated Contact with Patient 02/01/13 [phone]     Chief Complaint  Patient presents with  . Shortness of Breath   (Consider location/radiation/quality/duration/timing/severity/associated sxs/prior Treatment) The history is provided by the patient.  Desiree Floyd is a 79 y.o. female history of hypertension, hyperlipidemia,, COPD, MI here presenting with shortness of breath. Shortness of breath and dry cough for the last 2 days. She's been using her albuterol once daily without any improvement. Shortness of breath got worse this morning. Denies any chest pain. Denies any leg swelling or history of blood clots. She came by EMS was given two duonebs and 125 mg of Solu-Medrol.    Past Medical History  Diagnosis Date  . Hypertension   . Hyperlipidemia   . Diabetes mellitus 2012    diet controlled  . CAD (coronary artery disease) 2004    s/p stent [Jordan]  . Tobacco abuse   . OA (osteoarthritis)     of hands  . CPPD (pseudo-gout)   . COPD (chronic obstructive pulmonary disease)     not on meds, FEV1 39%, FVC 49%, ratio 0.59, not reversible w SABA (05/2008)  . Asthma   . Allergy   . Urge incontinence 2008    s/p urodynamics [Dahlstedt]  . Benign breast lumps     knows breast lumps, told scar tissue from implants  . Gout     Deveshwar/Landau  . PAC (premature atrial contraction)     chronic  . DJD (degenerative joint disease) of lumbar spine    Past Surgical History  Procedure Laterality Date  . Breast surgery    . Tonsillectomy    . Cataract extraction, bilateral  2010  . Coronary angioplasty with stent placement  2004  . Inner ear surgery      x2 for hearing loss,prosthesis implanted   Family History  Problem Relation Age of Onset  . Heart disease Father 18    CAD AND HEART ATTACK  . Other Mother     old age, age 73   History  Substance Use Topics  . Smoking status: Current Every Day Smoker  -- 0.75 packs/day  . Smokeless tobacco: Not on file  . Alcohol Use: Yes   OB History   Grav Para Term Preterm Abortions TAB SAB Ect Mult Living                 Review of Systems  Respiratory: Positive for shortness of breath and wheezing.   All other systems reviewed and are negative.    Allergies  Lisinopril  Home Medications   Current Outpatient Rx  Name  Route  Sig  Dispense  Refill  . acetaminophen (TYLENOL) 500 MG tablet   Oral   Take 1,000 mg by mouth every 6 (six) hours as needed for moderate pain.         Marland Kitchen albuterol (PROVENTIL HFA;VENTOLIN HFA) 108 (90 BASE) MCG/ACT inhaler   Inhalation   Inhale 2 puffs into the lungs every 6 (six) hours as needed for wheezing.   1 Inhaler   11     Ventolin equivalent   . allopurinol (ZYLOPRIM) 300 MG tablet   Oral   Take 1 tablet (300 mg total) by mouth daily.   90 tablet   3   . amLODipine (NORVASC) 5 MG tablet   Oral   Take 1 tablet (5 mg total) by mouth daily.  90 tablet   3   . aspirin 81 MG tablet   Oral   Take 81 mg by mouth daily.           Marland Kitchen losartan-hydrochlorothiazide (HYZAAR) 50-12.5 MG per tablet   Oral   Take 1 tablet by mouth daily.   90 tablet   1   . metoprolol tartrate (LOPRESSOR) 25 MG tablet      Take one-half tablet by   mouth in the morning and  one-half tablet in the  evening   90 tablet   2   . Omega-3 Fatty Acids (FISH OIL) 1000 MG CAPS   Oral   Take 4 capsules by mouth daily.           . simvastatin (ZOCOR) 20 MG tablet   Oral   Take 1 tablet (20 mg total) by mouth at bedtime.   90 tablet   3   . vitamin B-12 (CYANOCOBALAMIN) 1000 MCG tablet   Oral   Take 1,000 mcg by mouth daily.          BP 124/53  Pulse 127  Temp(Src) 99.3 F (37.4 C) (Axillary)  Resp 20  SpO2 98% Physical Exam  Nursing note and vitals reviewed. Constitutional: She is oriented to person, place, and time.  Chronically ill, slightly tachypneic, speaking in full sentences   HENT:  Head:  Normocephalic.  Mouth/Throat: Oropharynx is clear and moist.  Eyes: Conjunctivae are normal. Pupils are equal, round, and reactive to light.  Neck: Normal range of motion. Neck supple.  Cardiovascular: Regular rhythm and normal heart sounds.   Tachycardic   Pulmonary/Chest:  Tachypneic, +diffuse wheezing, no retractions. No crackles   Abdominal: Soft. Bowel sounds are normal. She exhibits no distension. There is no tenderness. There is no rebound and no guarding.  Musculoskeletal: Normal range of motion. She exhibits no edema and no tenderness.  Neurological: She is alert and oriented to person, place, and time.  Skin: Skin is warm and dry.  Psychiatric: She has a normal mood and affect. Her behavior is normal. Judgment and thought content normal.    ED Course  Procedures (including critical care time) Labs Review Labs Reviewed  CBC WITH DIFFERENTIAL - Abnormal; Notable for the following:    MCH 34.4 (*)    All other components within normal limits  COMPREHENSIVE METABOLIC PANEL - Abnormal; Notable for the following:    Potassium 2.8 (*)    Glucose, Bld 162 (*)    Albumin 3.3 (*)    GFR calc non Af Amer 82 (*)    All other components within normal limits  POCT I-STAT TROPONIN I   Imaging Review Dg Chest Portable 1 View  02/01/2013   CLINICAL DATA:  Short of breath.  EXAM: PORTABLE CHEST - 1 VIEW  COMPARISON:  02/24/2012  FINDINGS: Heart, mediastinum and hila are unremarkable.  The lungs are hyperexpanded. There is mild scarring in the upper lobes, stable. No lung consolidation or edema. No pleural effusion or pneumothorax.  The bony thorax is intact.  IMPRESSION: No acute cardiopulmonary disease.  COPD.   Electronically Signed   By: Amie Portland M.D.   On: 02/01/2013 09:06    EKG Interpretation    Date/Time:  Thursday February 01 2013 08:39:09 EST Ventricular Rate:  118 PR Interval:  138 QRS Duration: 81 QT Interval:  323 QTC Calculation: 452 R Axis:   85 Text  Interpretation:  Sinus arrhythmia Multiple premature complexes, vent  Borderline right axis deviation Nonspecific  repol abnormality, lateral leads Since last tracing rate faster Confirmed by Mirelle Biskup  MD, Shann Lewellyn [phone]) on 02/01/2013 8:45:07 AM            MDM  No diagnosis found. DRUCILLA CUMBER is a 79 y.o. female here with wheezing, SOB. Likely COPD exacerbation. Will put on continuous neb. No need for bipap currently. Will observe, likely need admission.    10:02 AM K 2.8, supplemented. Still wheezing after nebs. Will admit to tele.  Richardean Canal, MD 02/01/13 1003

## 2013-02-01 NOTE — ED Notes (Addendum)
Per EMS, she has been having increased SOB x few days. SOB became worse today. She has chronic non productive cough. Inhaler at home was not working. Medics started dual nebs. She has received a second treatment currently. Diffused wheezes all fields. 128/44, 115 98%. 20g L Hand.  Received 125mg  Solumedrol.

## 2013-02-01 NOTE — ED Notes (Signed)
EDP at bedside  

## 2013-02-01 NOTE — ED Notes (Signed)
Respiratory at bedside.

## 2013-02-01 NOTE — H&P (Addendum)
[facility] History and Physical  DARNEISHA WINDHORST BJY:[id number] DOB: 1933/01/11 DOA: 02/01/2013  Referring physician: Dr. Gwendolyn Grant PCP: Eustaquio Boyden, MD   Chief Complaint: SOB  HPI: Desiree Floyd is a 79 y.o. female  Past medical history of ongoing tobacco abuse, COPD with an FEV1 of 40%, and hypertension that comes in for shortness of breath and cough that started 2 days prior to admission. She has been using albuterol daily. Shortness of breath got worse this morning. Denies any chest pain. Denies any leg swelling or history of blood clots.    Review of Systems:  Constitutional:  No weight loss, night sweats, Fevers, chills, fatigue.  HEENT:  No headaches, Difficulty swallowing,Tooth/dental problems,Sore throat,  No sneezing, itching, ear ache, nasal congestion, post nasal drip,  Cardio-vascular:  No chest pain, Orthopnea, PND, swelling in lower extremities, anasarca, dizziness, palpitations  GI:  No heartburn, indigestion, abdominal pain, nausea, vomiting, diarrhea, change in bowel habits, loss of appetite  Resp:  No change in color of mucus.No wheezing.No chest wall deformity  Skin:  no rash or lesions.  GU:  no dysuria, change in color of urine, no urgency or frequency. No flank pain.  Musculoskeletal:  No joint pain or swelling. No decreased range of motion. No back pain.  Psych:  No change in mood or affect. No depression or anxiety. No memory loss.   Past Medical History  Diagnosis Date  . Hypertension   . Hyperlipidemia   . Diabetes mellitus 2012    diet controlled  . CAD (coronary artery disease) 2004    s/p stent [Jordan]  . Tobacco abuse   . OA (osteoarthritis)     of hands  . CPPD (pseudo-gout)   . COPD (chronic obstructive pulmonary disease)     not on meds, FEV1 39%, FVC 49%, ratio 0.59, not reversible w SABA (05/2008)  . Asthma   . Allergy   . Urge incontinence 2008    s/p urodynamics [Dahlstedt]  . Benign breast lumps     knows breast  lumps, told scar tissue from implants  . Gout     Deveshwar/Landau  . PAC (premature atrial contraction)     chronic  . DJD (degenerative joint disease) of lumbar spine    Past Surgical History  Procedure Laterality Date  . Breast surgery    . Tonsillectomy    . Cataract extraction, bilateral  2010  . Coronary angioplasty with stent placement  2004  . Inner ear surgery      x2 for hearing loss,prosthesis implanted   Social History:  reports that she has been smoking.  She does not have any smokeless tobacco history on file. She reports that she drinks alcohol. She reports that she does not use illicit drugs.  Allergies  Allergen Reactions  . Lisinopril Cough    Family History  Problem Relation Age of Onset  . Heart disease Father 70    CAD AND HEART ATTACK  . Other Mother     old age, age 53  . Heart disease Mother      Prior to Admission medications   Medication Sig Start Date End Date Taking? Authorizing Provider  acetaminophen (TYLENOL) 500 MG tablet Take 1,000 mg by mouth every 6 (six) hours as needed for moderate pain.   Yes Historical Provider, MD  albuterol (PROVENTIL HFA;VENTOLIN HFA) 108 (90 BASE) MCG/ACT inhaler Inhale 2 puffs into the lungs every 6 (six) hours as needed for wheezing. 08/21/12  Yes Eustaquio Boyden, MD  allopurinol (ZYLOPRIM) 300 MG tablet Take 1 tablet (300 mg total) by mouth daily. 05/05/12  Yes Eustaquio Boyden, MD  amLODipine (NORVASC) 5 MG tablet Take 1 tablet (5 mg total) by mouth daily. 05/05/12  Yes Eustaquio Boyden, MD  aspirin 81 MG tablet Take 81 mg by mouth daily.     Yes Historical Provider, MD  losartan-hydrochlorothiazide (HYZAAR) 50-12.5 MG per tablet Take 1 tablet by mouth daily. 12/05/12  Yes Eustaquio Boyden, MD  metoprolol tartrate (LOPRESSOR) 25 MG tablet Take one-half tablet by   mouth in the morning and  one-half tablet in the  evening 12/06/12  Yes Eustaquio Boyden, MD  Omega-3 Fatty Acids (FISH OIL) 1000 MG CAPS Take 4 capsules  by mouth daily.     Yes Historical Provider, MD  simvastatin (ZOCOR) 20 MG tablet Take 1 tablet (20 mg total) by mouth at bedtime. 05/05/12  Yes Eustaquio Boyden, MD  vitamin B-12 (CYANOCOBALAMIN) 1000 MCG tablet Take 1,000 mcg by mouth daily.   Yes Historical Provider, MD   Physical Exam: Filed Vitals:   02/01/13 1359  BP: 109/31  Pulse: 65  Temp: 98.3 F (36.8 C)  Resp: 20    BP 109/31  Pulse 65  Temp(Src) 98.3 F (36.8 C) (Oral)  Resp 20  SpO2 93%  General:  Appears calm Eyes: PERRL, normal lids, irises & conjunctiva ENT: grossly normal hearing, lips & tongue Neck: no LAD, masses or thyromegaly Cardiovascular: RRR, no m/r/g. No LE edema. Telemetry: SR, no arrhythmias  Respiratory: Moderate air movement with wheezing bilaterally Abdomen: soft, ntnd Skin: no rash or induration seen on limited exam Musculoskeletal: grossly normal tone BUE/BLE Psychiatric: grossly normal mood and affect, speech fluent and appropriate Neurologic: grossly non-focal.          Labs on Admission:  Basic Metabolic Panel:  Recent Labs Lab 02/01/13 0857  NA 138  K 2.8*  CL 97  CO2 26  GLUCOSE 162*  BUN 9  CREATININE 0.67  CALCIUM 8.7   Liver Function Tests:  Recent Labs Lab 02/01/13 0857  AST 28  ALT 23  ALKPHOS 75  BILITOT 0.5  PROT 7.1  ALBUMIN 3.3*   No results found for this basename: LIPASE, AMYLASE,  in the last 168 hours No results found for this basename: AMMONIA,  in the last 168 hours CBC:  Recent Labs Lab 02/01/13 0857  WBC 8.8  NEUTROABS 5.5  HGB 14.6  HCT 41.7  MCV 98.3  PLT 152   Cardiac Enzymes: No results found for this basename: CKTOTAL, CKMB, CKMBINDEX, TROPONINI,  in the last 168 hours  BNP (last 3 results) No results found for this basename: PROBNP,  in the last 8760 hours CBG: No results found for this basename: GLUCAP,  in the last 168 hours  Radiological Exams on Admission: Dg Chest Portable 1 View  02/01/2013   CLINICAL DATA:  Short  of breath.  EXAM: PORTABLE CHEST - 1 VIEW  COMPARISON:  02/24/2012  FINDINGS: Heart, mediastinum and hila are unremarkable.  The lungs are hyperexpanded. There is mild scarring in the upper lobes, stable. No lung consolidation or edema. No pleural effusion or pneumothorax.  The bony thorax is intact.  IMPRESSION: No acute cardiopulmonary disease.  COPD.   Electronically Signed   By: Amie Portland M.D.   On: 02/01/2013 09:06    EKG: Independently reviewed. MAT with non specific T-wave changes.  Assessment/Plan Respiratory failure, acute due COPD: - IV steroids, antibiotics and inhalers, albuterol and spiriva. - Start  O2, check ambulation in am without O2 L. - CXR no infiltrate.  HYPERTENSION: - Resume home Meds.  Type 2 diabetes mellitus: - On no medications at home - check A1c   Code Status: full Family Communication: none Disposition Plan: inpatient  Time spent: 60 minutes  Marinda Elk [facility] Pager [phone]

## 2013-02-01 NOTE — ED Notes (Signed)
Report given to RN on 2W. Pt transported upstairs on telemetry. Belongings and paperwork with pt.

## 2013-02-01 NOTE — ED Notes (Signed)
Saline increased to 164ml/hr.  Pt complained of burning in arm.

## 2013-02-02 ENCOUNTER — Encounter (HOSPITAL_COMMUNITY): Payer: Self-pay | Admitting: General Practice

## 2013-02-02 DIAGNOSIS — F17200 Nicotine dependence, unspecified, uncomplicated: Secondary | ICD-10-CM

## 2013-02-02 DIAGNOSIS — E876 Hypokalemia: Secondary | ICD-10-CM

## 2013-02-02 DIAGNOSIS — J449 Chronic obstructive pulmonary disease, unspecified: Secondary | ICD-10-CM

## 2013-02-02 DIAGNOSIS — I10 Essential (primary) hypertension: Secondary | ICD-10-CM

## 2013-02-02 DIAGNOSIS — J9600 Acute respiratory failure, unspecified whether with hypoxia or hypercapnia: Principal | ICD-10-CM

## 2013-02-02 DIAGNOSIS — E119 Type 2 diabetes mellitus without complications: Secondary | ICD-10-CM

## 2013-02-02 DIAGNOSIS — J441 Chronic obstructive pulmonary disease with (acute) exacerbation: Secondary | ICD-10-CM

## 2013-02-02 LAB — CBC
HCT: 37.5 % (ref 36.0–46.0)
Hemoglobin: 12.8 g/dL (ref 12.0–15.0)
MCH: 33.7 pg (ref 26.0–34.0)
MCHC: 34.1 g/dL (ref 30.0–36.0)
RBC: 3.8 MIL/uL — ABNORMAL LOW (ref 3.87–5.11)
WBC: 16 10*3/uL — ABNORMAL HIGH (ref 4.0–10.5)

## 2013-02-02 LAB — BASIC METABOLIC PANEL
BUN: 16 mg/dL (ref 6–23)
Calcium: 8.4 mg/dL (ref 8.4–10.5)
Chloride: 100 mEq/L (ref 96–112)
GFR calc non Af Amer: 79 mL/min — ABNORMAL LOW (ref >90)
Glucose, Bld: 237 mg/dL — ABNORMAL HIGH (ref 70–99)
Potassium: 4 mEq/L (ref 3.5–5.1)
Sodium: 136 mEq/L (ref 135–145)

## 2013-02-02 LAB — GLUCOSE, CAPILLARY
Glucose-Capillary: 126 mg/dL — ABNORMAL HIGH (ref 70–99)
Glucose-Capillary: 138 mg/dL — ABNORMAL HIGH (ref 70–99)
Glucose-Capillary: 103 mg/dL — ABNORMAL HIGH (ref 70–99)
Glucose-Capillary: 168 mg/dL — ABNORMAL HIGH (ref 70–99)

## 2013-02-02 LAB — HEMOGLOBIN A1C: Mean Plasma Glucose: 131 mg/dL — ABNORMAL HIGH (ref <117)

## 2013-02-02 MED ORDER — BUDESONIDE 0.25 MG/2ML IN SUSP
0.2500 mg | Freq: Two times a day (BID) | RESPIRATORY_TRACT | Status: DC
  Administered 2013-02-02 – 2013-02-03 (×3): 0.25 mg via RESPIRATORY_TRACT
  Filled 2013-02-02 (×5): qty 2

## 2013-02-02 MED ORDER — PANTOPRAZOLE SODIUM 40 MG PO TBEC
40.0000 mg | DELAYED_RELEASE_TABLET | Freq: Every day | ORAL | Status: DC
  Filled 2013-02-02: qty 1

## 2013-02-02 MED ORDER — DOXYCYCLINE HYCLATE 100 MG PO TABS
100.0000 mg | ORAL_TABLET | Freq: Two times a day (BID) | ORAL | Status: DC
  Administered 2013-02-02 – 2013-02-03 (×2): 100 mg via ORAL
  Filled 2013-02-02 (×5): qty 1

## 2013-02-02 NOTE — Progress Notes (Signed)
[facility] PROGRESS NOTE  Desiree Floyd:[id number] DOB: [birth year] DOA: 02/01/2013 PCP: Eustaquio Boyden, MD  Assessment/Plan: 1-acute resp failure due to COPD exacerbation: patient still wheezing and with SOB -will continue solumedrol and duo-nebs -continue antibiotics (change to PO) -PRN oxygen supplementation -will start pulmicort BID -follow clinical response  2-HTN: stable. Continue current medication regimen  3-diabetes: was just on diet prior to admission -A1C 6.2 -will start metformin at discharge -continue SSI while inpatient  4-GERD: continue PPI  5-HLD: continue statins  DVT: heparin  Code Status: Full Family Communication: husband at bedside  Disposition Plan: home when medically stable    Consultants: None   Procedures:  See below for x-ray reports  Antibiotics:  Doxycycline   HPI/Subjective: Patient denies CP, is afebrile and feeling a little better. Positive wheezing and still with SOB.  Objective: Filed Vitals:   02/02/13 0528  BP: 98/50  Pulse: 71  Temp: 98.6 F (37 C)  Resp: 20    Intake/Output Summary (Last 24 hours) at 02/02/13 1038 Last data filed at 02/02/13 8119  Gross per 24 hour  Intake   1090 ml  Output      0 ml  Net   1090 ml   There were no vitals filed for this visit.  Exam:   General:  NAD, afebrile, slightly better but still with SOB and wheezing  Cardiovascular: S1 and S2, no rubs or gallops  Respiratory: decrease air movement, positive exp wheezing; no use of accessory muscles  Abdomen: soft. NT, positive BS  Musculoskeletal: no edema or cyanosis  Data Reviewed: Basic Metabolic Panel:  Recent Labs Lab 02/01/13 0857 02/01/13 1500 02/02/13 0410  NA 138  --  136  K 2.8*  --  4.0  CL 97  --  100  CO2 26  --  26  GLUCOSE 162*  --  237*  BUN 9  --  16  CREATININE 0.67 0.89 0.75  CALCIUM 8.7  --  8.4   Liver Function Tests:  Recent Labs Lab 02/01/13 0857  AST 28  ALT 23   ALKPHOS 75  BILITOT 0.5  PROT 7.1  ALBUMIN 3.3*   CBC:  Recent Labs Lab 02/01/13 0857 02/01/13 1500 02/02/13 0410  WBC 8.8 7.2 16.0*  NEUTROABS 5.5  --   --   HGB 14.6 13.8 12.8  HCT 41.7 40.5 37.5  MCV 98.3 100.0 98.7  PLT 152 135* 138*   CBG:  Recent Labs Lab 02/01/13 1606 02/02/13 0610  GLUCAP 331* 167*    No results found for this or any previous visit (from the past 240 hour(s)).   Studies: Dg Chest Portable 1 View  02/01/2013   CLINICAL DATA:  Short of breath.  EXAM: PORTABLE CHEST - 1 VIEW  COMPARISON:  02/24/2012  FINDINGS: Heart, mediastinum and hila are unremarkable.  The lungs are hyperexpanded. There is mild scarring in the upper lobes, stable. No lung consolidation or edema. No pleural effusion or pneumothorax.  The bony thorax is intact.  IMPRESSION: No acute cardiopulmonary disease.  COPD.   Electronically Signed   By: Amie Portland M.D.   On: 02/01/2013 09:06    Scheduled Meds: . albuterol  2.5 mg Nebulization Q6H  . allopurinol  300 mg Oral Daily  . amLODipine  5 mg Oral Daily  . aspirin  81 mg Oral Daily  . budesonide (PULMICORT) nebulizer solution  0.25 mg Nebulization BID  . doxycycline  100 mg Oral Q12H  . heparin  5,000 Units  Subcutaneous Q8H  . hydrochlorothiazide  12.5 mg Oral Daily  . insulin aspart  0-15 Units Subcutaneous TID WC  . insulin aspart  0-5 Units Subcutaneous QHS  . insulin aspart  4 Units Subcutaneous TID WC  . losartan  50 mg Oral Daily  . methylPREDNISolone (SOLU-MEDROL) injection  80 mg Intravenous Q8H  . metoprolol tartrate  12.5 mg Oral BID  . senna  1 tablet Oral BID  . simvastatin  20 mg Oral QHS  . sodium chloride  3 mL Intravenous Q12H  . sodium chloride  3 mL Intravenous Q12H  . tiotropium  18 mcg Inhalation Daily   Continuous Infusions:   Active Problems:   HYPERTENSION   Type 2 diabetes mellitus   COPD exacerbation   COPD (chronic obstructive pulmonary disease)   Respiratory failure, acute    Time  spent: >30 minutes    Buna Cuppett  [facility] Pager [phone]. If 7PM-7AM, please contact night-coverage at [email or web address], password [facility] 02/02/2013, 10:38 AM  LOS: 1 day

## 2013-02-02 NOTE — Progress Notes (Signed)
Pt ambulated approx. 450 ft with RN and continuous pulse ox. Pt started out 93% RA a rest.  As pt ambulated, O2 ranged from 83-88%.  After ambulating O2 back to 93% at rest RA.  Pt tolerated well and had no complaints except "a little" SOB.  Pt back to bed resting with call bell within reach.  Will continue to monitor pt closely.  Clovis Fredrickson A

## 2013-02-03 DIAGNOSIS — K219 Gastro-esophageal reflux disease without esophagitis: Secondary | ICD-10-CM

## 2013-02-03 LAB — BASIC METABOLIC PANEL
CO2: 31 mEq/L (ref 19–32)
Calcium: 8.8 mg/dL (ref 8.4–10.5)
Chloride: 101 mEq/L (ref 96–112)
Creatinine, Ser: 0.7 mg/dL (ref 0.50–1.10)
GFR calc Af Amer: >90 mL/min (ref >90)
Sodium: 141 mEq/L (ref 135–145)

## 2013-02-03 LAB — GLUCOSE, CAPILLARY: Glucose-Capillary: 123 mg/dL — ABNORMAL HIGH (ref 70–99)

## 2013-02-03 MED ORDER — TIOTROPIUM BROMIDE MONOHYDRATE 18 MCG IN CAPS: 18.0000 ug | ORAL_CAPSULE | Freq: Every day | RESPIRATORY_TRACT | Status: DC

## 2013-02-03 MED ORDER — ALBUTEROL SULFATE HFA 108 (90 BASE) MCG/ACT IN AERS: 2.0000 | INHALATION_SPRAY | Freq: Four times a day (QID) | RESPIRATORY_TRACT | Status: DC | PRN

## 2013-02-03 MED ORDER — PANTOPRAZOLE SODIUM 40 MG PO TBEC: 40.0000 mg | DELAYED_RELEASE_TABLET | Freq: Every day | ORAL | Status: DC

## 2013-02-03 MED ORDER — DOXYCYCLINE HYCLATE 100 MG PO TABS: 100.0000 mg | ORAL_TABLET | Freq: Two times a day (BID) | ORAL | Status: DC

## 2013-02-03 MED ORDER — BUDESONIDE-FORMOTEROL FUMARATE 160-4.5 MCG/ACT IN AERO: 2.0000 | INHALATION_SPRAY | Freq: Two times a day (BID) | RESPIRATORY_TRACT | Status: DC

## 2013-02-03 MED ORDER — PREDNISONE 20 MG PO TABS: ORAL_TABLET | ORAL | Status: DC

## 2013-02-03 MED ORDER — NICOTINE 21 MG/24HR TD PT24: 21.0000 mg | MEDICATED_PATCH | Freq: Every day | TRANSDERMAL | Status: DC

## 2013-02-03 NOTE — Discharge Summary (Signed)
Physician Discharge Summary  Desiree Floyd ZOX:[id number] DOB: [birth year] DOA: 02/01/2013  PCP: Eustaquio Boyden, MD  Admit date: 02/01/2013 Discharge date: 02/03/2013  Time spent: >30 minutes  Recommendations for Outpatient Follow-up:  1. BMET to follow electrolytes and renal function 2. Close follow up to cbg's and start hypoglycemic regimen A1C 6.2 3. Needs referral to pulmonology service for PFT's and further eval/treatment of COPD  Discharge Diagnoses:  Acute resp failure COPD with acute exacerbation HYPERTENSION Type 2 diabetes mellitus GERD HLD   Discharge Condition: stable and improved. Discharge home. Will follow with PCP in 1 week  Diet recommendation: low sodium and low carb diet  There were no vitals filed for this visit.  History of present illness:  80 y.o. female Past medical history of ongoing tobacco abuse, COPD with an FEV1 of 40%, and hypertension that comes in for shortness of breath and cough that started 2 days prior to admission. She has been using albuterol daily. Shortness of breath got worse this morning. Denies any chest pain. Denies any leg swelling or history of blood clots.    Hospital Course:  1-acute resp failure due to COPD exacerbation: patient breathing a lot better and just slight wheezing on exam; good air movement and normal O2 sat on RA (rest and ambulation) -will discharge on prednisone tapering and antibiotics -encourage/instructed to stop smoking -rescue inhaler albuterol; maintenance therapy symbicort and spiriva  -will follow with PCP in 1 week -will also need pulmonology appointment for PFT's  2-HTN: stable. Continue current medication regimen   3-diabetes: was just on diet prior to admission  -A1C 6.2  -after discussing with patient she will like to follow with PCP before starting any hypoglycemic regimen  -instructed to follow low carb diet  4-GERD: continue PPI   5-HLD: continue statins   Procedures:  See below  for x-ray reports   Consultations:  None   Discharge Exam: Filed Vitals:   02/03/13 0454  BP: 141/49  Pulse: 73  Temp: 97.7 F (36.5 C)  Resp:     General: breathing a lot better, good O2 sat on RA, no significant wheezing Cardiovascular: S1 and S2, no rubs or gallops Respiratory: slight exp wheezing, good air movement, no crackles Abd: soft, NT, ND, positive BS Neuro: non focal  Discharge Instructions  Discharge Orders   Future Orders Complete By Expires   Discharge instructions  As directed    Comments:     Take medications as prescribed Stop smoking Arrange follow up with PCP and pulmonology service as indicated Follow a heart healthy diet Follow a low carb diet       Medication List         acetaminophen 500 MG tablet  Commonly known as:  TYLENOL  Take 1,000 mg by mouth every 6 (six) hours as needed for moderate pain.     albuterol 108 (90 BASE) MCG/ACT inhaler  Commonly known as:  PROVENTIL HFA;VENTOLIN HFA  Inhale 2 puffs into the lungs every 6 (six) hours as needed for wheezing or shortness of breath.     allopurinol 300 MG tablet  Commonly known as:  ZYLOPRIM  Take 1 tablet (300 mg total) by mouth daily.     amLODipine 5 MG tablet  Commonly known as:  NORVASC  Take 1 tablet (5 mg total) by mouth daily.     aspirin 81 MG tablet  Take 81 mg by mouth daily.     budesonide-formoterol 160-4.5 MCG/ACT inhaler  Commonly known as:  SYMBICORT  Inhale 2 puffs into the lungs 2 (two) times daily.     doxycycline 100 MG tablet  Commonly known as:  VIBRA-TABS  Take 1 tablet (100 mg total) by mouth every 12 (twelve) hours.     Fish Oil 1000 MG Caps  Take 4 capsules by mouth daily.     losartan-hydrochlorothiazide 50-12.5 MG per tablet  Commonly known as:  HYZAAR  Take 1 tablet by mouth daily.     metoprolol tartrate 25 MG tablet  Commonly known as:  LOPRESSOR  Take one-half tablet by   mouth in the morning and  one-half tablet in the  evening      nicotine 21 mg/24hr patch  Commonly known as:  NICODERM CQ - dosed in mg/24 hours  Place 1 patch (21 mg total) onto the skin daily.     pantoprazole 40 MG tablet  Commonly known as:  PROTONIX  Take 1 tablet (40 mg total) by mouth daily.     predniSONE 20 MG tablet  Commonly known as:  DELTASONE  Take 3 tabs by mouth for 2 days; then 2 tabs by mouth for 3 days; then 1 tab by mouth for 2 days; then 1/2 tab by mouth daily for 2 days and stop prednisone     simvastatin 20 MG tablet  Commonly known as:  ZOCOR  Take 1 tablet (20 mg total) by mouth at bedtime.     tiotropium 18 MCG inhalation capsule  Commonly known as:  SPIRIVA  Place 1 capsule (18 mcg total) into inhaler and inhale daily.     vitamin B-12 1000 MCG tablet  Commonly known as:  CYANOCOBALAMIN  Take 1,000 mcg by mouth daily.       Allergies  Allergen Reactions  . Lisinopril Cough       Follow-up Information   Follow up with Eustaquio Boyden, MD. Schedule an appointment as soon as possible for a visit in 1 week.   Specialty:  Family Medicine   Contact information:   [address] [place] [address] [phone]       Call [facility]. (to set up appointment and have PFT's done in 2 weeks )    Specialty:  Pulmonology   Contact information:   [address] [place] [address] [phone]       The results of significant diagnostics from this hospitalization (including imaging, microbiology, ancillary and laboratory) are listed below for reference.    Significant Diagnostic Studies: Dg Chest Portable 1 View  02/01/2013   CLINICAL DATA:  Short of breath.  EXAM: PORTABLE CHEST - 1 VIEW  COMPARISON:  02/24/2012  FINDINGS: Heart, mediastinum and hila are unremarkable.  The lungs are hyperexpanded. There is mild scarring in the upper lobes, stable. No lung consolidation or edema. No pleural effusion or pneumothorax.  The bony thorax is intact.  IMPRESSION: No acute cardiopulmonary  disease.  COPD.   Electronically Signed   By: Amie Portland M.D.   On: 02/01/2013 09:06   Labs: Basic Metabolic Panel:  Recent Labs Lab 02/01/13 0857 02/01/13 1500 02/02/13 0410 02/03/13 0611  NA 138  --  136 141  K 2.8*  --  4.0 4.2  CL 97  --  100 101  CO2 26  --  26 31  GLUCOSE 162*  --  237* 147*  BUN 9  --  16 19  CREATININE 0.67 0.89 0.75 0.70  CALCIUM 8.7  --  8.4 8.8   Liver Function  Tests:  Recent Labs Lab 02/01/13 0857  AST 28  ALT 23  ALKPHOS 75  BILITOT 0.5  PROT 7.1  ALBUMIN 3.3*   CBC:  Recent Labs Lab 02/01/13 0857 02/01/13 1500 02/02/13 0410  WBC 8.8 7.2 16.0*  NEUTROABS 5.5  --   --   HGB 14.6 13.8 12.8  HCT 41.7 40.5 37.5  MCV 98.3 100.0 98.7  PLT 152 135* 138*   CBG:  Recent Labs Lab 02/02/13 0610 02/02/13 1130 02/02/13 1612 02/02/13 2117 02/03/13 0610  GLUCAP 167* 138* 103* 168* 123*    Signed:  Antara Brecheisen  [facility] 02/03/2013, 10:14 AM

## 2013-02-03 NOTE — Progress Notes (Signed)
Pt discharged per Md order and protocol. Discharge instructions reviewed with pt and husband. Pt given all prescriptions. Pt aware of follow up appointments.

## 2013-02-04 ENCOUNTER — Telehealth: Payer: Self-pay | Admitting: Family Medicine

## 2013-02-04 NOTE — Telephone Encounter (Signed)
Pt admitted to COPD exacerbation from 12/25-27/2014.   plz call in next 1-2 days for f/u and to schedule f/u appt w/in 2 wks.

## 2013-02-05 NOTE — Telephone Encounter (Signed)
Transitional Care Call: pt d/c'd from hospital on 02/03/13.  D/C dx: Acute resp failure, COPD with acute exacerbation, Hypertension, Type 2 diabetes mellitus,  GERD, and Hyperlipidemia.  Spoke with patient.  Pt says that she is "feeling better, not there yet, but getting there".  She says she has not smoked since 01/31/13.  She still gets somewhat out of breath with ambulation but will take rest breaks when needed.  Pt lives at home with husband.  Ambulating and performing ADLs independently.  Denies any questions regarding d/c instructions or med list.  Denies any questions for Dr. Sharen Hones at this time.  Follow up appt scheduled for 02/19/13 at 12:30.

## 2013-02-06 ENCOUNTER — Telehealth: Payer: Self-pay | Admitting: Family Medicine

## 2013-02-06 NOTE — Telephone Encounter (Signed)
Patient Information:  Caller Name: Desiree Floyd  Phone: [phone]  Patient: Desiree Floyd  Gender: Female  DOB: [birth year]  Age: 79 Years  PCP: Eustaquio Boyden [facility])  Office Follow Up:  Does the office need to follow up with this patient?: Yes  Instructions For The Office: Please read note and f/u with pt if MD would like to see her sooner than June 12th or prescribe oral medication for her high BS.  She was reassured with our conversation, however, is still very concerned since she was advised she was borderline in the office.  RN Note:  Patient has f/u appt for January the 12th.  She is currently taking 20mg  of Prednisone daily at this time.  She started with 30mg , she decreased to 20mg  yesterday 12/29.  Advised her will send a note to MD and see if they would like to see her in the office earlier than planned.  Explained to her that she is taking a medication that will increase her BS, this would improve as her Prednisone is decreased and slowly weaned.  Will speak with MD to see if he would like to see her sooner than the 12th.  Discussed Low Carb diet with this pt. She did receive information from the hospital regarding this.  Symptoms  Reason For Call & Symptoms: She was just d/c from the hospital for a COPD flare.  She is concerned because her BS is still continuing to be high.  Her BS today at 14:00 was 238.  She is borderline diabetic and is not on any medication at this time.   She was d/c from the hospital on 27/2014  Reviewed Health History In EMR: Yes  Reviewed Medications In EMR: Yes  Reviewed Allergies In EMR: Yes  Reviewed Surgeries / Procedures: Yes  Date of Onset of Symptoms: 02/01/2013  Guideline(s) Used:  Diabetes - High Blood Sugar  Disposition Per Guideline:   Home Care  Reason For Disposition Reached:   Blood glucose 60-240 mg/dl (3.5 -13 mmol/l)  Advice Given:  Measure and Record Your Blood Glucose  Every day you should measure your blood  glucose before breakfast and before going to bed.  Record the results and show them to your doctor at your next office visit.  Measure and Record Your Blood Glucose  Every day you should measure your blood glucose before breakfast and before going to bed.  Record the results and show them to your doctor at your next office visit.  Expected Course  Your blood sugar continues to get above 240 mg/dl (13 mmol/l).  Your blood sugar continues to be higher than your daily glucose goals (set by you and your doctor).  It has been longer than 6 months since you had an Hemoglobin A1C test.  Call Back If:  Vomiting lasting more than 4 hours or unable to drink any liquids.  Rapid breathing occurs  You become worse.  Diet  Appetite OK, minimal nausea: Continue your normal diabetic meal plan. Avoid spicy or greasy foods.  Liquids  Drink more fluids, at least 8-10 glasses daily (8 oz or 240 ml each glass).  Even more liquids are needed if there is fever, vomiting, or diarrhea.  Patient Will Follow Care Advice:  YES

## 2013-02-08 DIAGNOSIS — R220 Localized swelling, mass and lump, head: Secondary | ICD-10-CM

## 2013-02-08 HISTORY — DX: Localized swelling, mass and lump, head: R22.0

## 2013-02-09 NOTE — Telephone Encounter (Signed)
Agree prednisone will increase blood sugars.  Agree on low carb diet especially when taking prednisone. Ok to wait until f/u appt unless she starts feeling worse prior. Lab Results  Component Value Date   HGBA1C 6.2* 02/01/2013

## 2013-02-12 NOTE — Telephone Encounter (Signed)
Patient notified. She will keep appt as scheduled. She wanted me to tell you that she has QUIT SMOKING!!!!!!!!!!!!!!!!!! No cigarette since Christmas Day!!!!!

## 2013-02-19 ENCOUNTER — Encounter: Payer: Self-pay | Admitting: Family Medicine

## 2013-02-19 ENCOUNTER — Ambulatory Visit (INDEPENDENT_AMBULATORY_CARE_PROVIDER_SITE_OTHER): Payer: Medicare Other | Admitting: Family Medicine

## 2013-02-19 VITALS — BP 132/68 | HR 75 | Temp 98.4°F | Wt 122.5 lb

## 2013-02-19 DIAGNOSIS — E119 Type 2 diabetes mellitus without complications: Secondary | ICD-10-CM

## 2013-02-19 DIAGNOSIS — J449 Chronic obstructive pulmonary disease, unspecified: Secondary | ICD-10-CM

## 2013-02-19 DIAGNOSIS — Z87891 Personal history of nicotine dependence: Secondary | ICD-10-CM

## 2013-02-19 NOTE — Patient Instructions (Signed)
Return in 1-2 months for spirometry in office to check lung function. Continue symbicort 2 puffs twice daily (rinse mouth after use), continue spiriva nightly, using albuterol as needed for shortness of breath or wheezing. Good to see you today, call us with questions. We know that when you'[re on prednisone, your sugars will rise so we have to monitor those. Stop protonix as you don't need this medicine. Congratulations on quitting smoking!!

## 2013-02-19 NOTE — Progress Notes (Signed)
Subjective:    Patient ID: Desiree Floyd, female    DOB: 04/08/1932, 80 y.o.   MRN: [id number]  HPI CC : hosp f/u  Desiree Floyd is a pleasant 80 yo who presents today as a hospital f/u visit for recent COPD exacerbation hospitalization.  She was treated with doxycycline and prednisone taper which she has completed.  She was started on spiriva and symbicort for maintenance of COPD.  She is compliant with this regimen.  She also has albuterol inhaler to use prn.  No longer wheezing, coughing or short winded. UTD pneumovax (2009) Started on protonix in the hospital, but she has NO H/O GERD.  Advised to stop this medicine.  She was also found to have elevated sugars - attributed to prednisone.  Since she's been home and off prednisone, she has had sugars ranging fasting 80-100s, pm 100-150s. Lab Results  Component Value Date   HGBA1C 6.2* 02/01/2013    Quit smoking!!! Did not need nicotine patch. Wt Readings from Last 3 Encounters:  02/19/13 122 lb 8 oz (55.566 kg)  11/06/12 122 lb 8 oz (55.566 kg)  08/21/12 119 lb (53.978 kg)  Body mass index is 22.4 kg/(m^2).   EXAM:  PORTABLE CHEST - 1 VIEW  COMPARISON: 02/24/2012  FINDINGS:  Heart, mediastinum and hila are unremarkable.  The lungs are hyperexpanded. There is mild scarring in the upper lobes, stable. No lung consolidation or edema. No pleural effusion or pneumothorax.  The bony thorax is intact.  IMPRESSION:  No acute cardiopulmonary disease.  COPD.  Admit date: 02/01/2013  Discharge date: 02/03/2013  F/u phone call: 02/05/2013 Recommendations for Outpatient Follow-up:  1. BMET to follow electrolytes and renal function 2. Close follow up to cbg's and start hypoglycemic regimen A1C 6.2 3. Needs referral to pulmonology service for PFT's and further eval/treatment of COPD Discharge Diagnoses:  Acute resp failure  COPD with acute exacerbation  HYPERTENSION  Type 2 diabetes mellitus  GERD  HLD  Discharge Condition:  stable and improved. Discharge home. Will follow with PCP in 1 week  Diet recommendation: low sodium and low carb diet  There were no vitals filed for this visit.  History of present illness:  80 y.o. female Past medical history of ongoing tobacco abuse, COPD with an FEV1 of 40%, and hypertension that comes in for shortness of breath and cough that started 2 days prior to admission. She has been using albuterol daily. Shortness of breath got worse this morning. Denies any chest pain. Denies any leg swelling or history of blood clots.  Hospital Course:  1-acute resp failure due to COPD exacerbation: patient breathing a lot better and just slight wheezing on exam; good air movement and normal O2 sat on RA (rest and ambulation)  -will discharge on prednisone tapering and antibiotics  -encourage/instructed to stop smoking  -rescue inhaler albuterol; maintenance therapy symbicort and spiriva  -will follow with PCP in 1 week  -will also need pulmonology appointment for PFT's  2-HTN: stable. Continue current medication regimen  3-diabetes: was just on diet prior to admission  -A1C 6.2  -after discussing with patient she will like to follow with PCP before starting any hypoglycemic regimen  -instructed to follow low carb diet  4-GERD: continue PPI  5-HLD: continue statins    Medications and allergies reviewed and updated in chart.  Past histories reviewed and updated if relevant as below. Patient Active Problem List   Diagnosis Date Noted  . COPD (chronic obstructive pulmonary disease) 02/01/2013  .  Respiratory failure, acute 02/01/2013  . COPD exacerbation 08/21/2012  . PAD (peripheral artery disease) 06/15/2012  . PAC (premature atrial contraction)   . Medicare annual wellness visit, initial 02/24/2012  . Insomnia 03/16/2011  . CAD (coronary artery disease) 11/03/2010  . Type 2 diabetes mellitus 03/12/2010  . BREAST MASS, BENIGN 03/12/2010  . OTHER SPEC DISEASES BLOOD&BLOOD-FORMING  ORGANS 03/03/2010  . HYPERLIPIDEMIA 12/16/2009  . GOUT, UNSPECIFIED 12/16/2009  . TOBACCO ABUSE 12/16/2009  . HYPERTENSION 12/16/2009  . COPD, MILD 12/16/2009  . CATARACT EXTRACTIONS, BILATERAL, HX OF 12/16/2009   Past Medical History  Diagnosis Date  . Hypertension   . Hyperlipidemia   . Diabetes mellitus 2012    diet controlled  . CAD (coronary artery disease) 2004    s/p stent [Jordan]  . Tobacco abuse   . OA (osteoarthritis)     of hands  . CPPD (pseudo-gout)   . COPD (chronic obstructive pulmonary disease)     not on meds, FEV1 39%, FVC 49%, ratio 0.59, not reversible w SABA (05/2008)  . Asthma   . Allergy   . Urge incontinence 2008    s/p urodynamics [Dahlstedt]  . Benign breast lumps     knows breast lumps, told scar tissue from implants  . Gout     Deveshwar/Landau  . PAC (premature atrial contraction)     chronic  . DJD (degenerative joint disease) of lumbar spine   . Myocardial infarction  2004    "right before I had stent put in" (02/02/2013)  . Exertional shortness of breath    Past Surgical History  Procedure Laterality Date  . Tonsillectomy    . Cataract extraction, bilateral  2010  . Inner ear surgery Bilateral     "had one surgery on each ear for hearing loss:  prosthesis implanted in right ear; metal piece placed in left ear"  . Augmentation mammaplasty Bilateral 1970's  . Coronary angioplasty with stent placement  2004    "1"    History  Substance Use Topics  . Smoking status: Current Every Day Smoker -- 0.75 packs/day for 58 years  . Smokeless tobacco: Never Used  . Alcohol Use: Yes     Comment: 02/02/2013 "maybe a glass of wine twice/yr"   Family History  Problem Relation Age of Onset  . Heart disease Father 21    CAD AND HEART ATTACK  . Other Mother     old age, age 41  . Heart disease Mother    Allergies  Allergen Reactions  . Lisinopril Cough   Current Outpatient Prescriptions on File Prior to Visit  Medication Sig Dispense  Refill  . acetaminophen (TYLENOL) 500 MG tablet Take 1,000 mg by mouth every 6 (six) hours as needed for moderate pain.      Marland Kitchen albuterol (PROVENTIL HFA;VENTOLIN HFA) 108 (90 BASE) MCG/ACT inhaler Inhale 2 puffs into the lungs every 6 (six) hours as needed for wheezing or shortness of breath.  1 Inhaler  3  . allopurinol (ZYLOPRIM) 300 MG tablet Take 1 tablet (300 mg total) by mouth daily.  90 tablet  3  . amLODipine (NORVASC) 5 MG tablet Take 1 tablet (5 mg total) by mouth daily.  90 tablet  3  . aspirin 81 MG tablet Take 81 mg by mouth daily.        . budesonide-formoterol (SYMBICORT) 160-4.5 MCG/ACT inhaler Inhale 2 puffs into the lungs 2 (two) times daily.  1 Inhaler  12  . losartan-hydrochlorothiazide (HYZAAR) 50-12.5 MG per tablet  Take 1 tablet by mouth daily.  90 tablet  1  . metoprolol tartrate (LOPRESSOR) 25 MG tablet Take one-half tablet by   mouth in the morning and  one-half tablet in the  evening  90 tablet  2  . Omega-3 Fatty Acids (FISH OIL) 1000 MG CAPS Take 4 capsules by mouth daily.        . pantoprazole (PROTONIX) 40 MG tablet Take 1 tablet (40 mg total) by mouth daily.  30 tablet  1  . simvastatin (ZOCOR) 20 MG tablet Take 1 tablet (20 mg total) by mouth at bedtime.  90 tablet  3  . tiotropium (SPIRIVA) 18 MCG inhalation capsule Place 1 capsule (18 mcg total) into inhaler and inhale daily.  30 capsule  3  . vitamin B-12 (CYANOCOBALAMIN) 1000 MCG tablet Take 1,000 mcg by mouth daily.      . nicotine (NICODERM CQ - DOSED IN MG/24 HOURS) 21 mg/24hr patch Place 1 patch (21 mg total) onto the skin daily.  28 patch  0   No current facility-administered medications on file prior to visit.      Review of Systems Per HPI    Objective:   Physical Exam  Nursing note and vitals reviewed. Constitutional: She appears well-developed and well-nourished. No distress.  HENT:  Mouth/Throat: Oropharynx is clear and moist. No oropharyngeal exudate.  Cardiovascular: Normal rate, regular  rhythm, normal heart sounds and intact distal pulses.   No murmur heard. Pulmonary/Chest: Effort normal and breath sounds normal. No respiratory distress. She has no wheezes. She has no rales.  Musculoskeletal: She exhibits no edema.  Skin: Skin is warm and dry. No rash noted.        Assessment & Plan:

## 2013-02-19 NOTE — Assessment & Plan Note (Signed)
Recent hospitalization for COPD exacerbation. Given severity of exac, agree with triple therapy for now.   I asked her to return for spirometry in 6 wks and will decide on continued therapy at that time. Discussed benefits of remaining smoke free- encouraged she recruit husband to quit with her. I don't think she needs BMP today so will abstain from drawing blood work.

## 2013-02-19 NOTE — Assessment & Plan Note (Signed)
Actually prediabetic as of last check.  Anticipate recent prednisone caused bump in sugars.  Will continue to monitor off meds. Lab Results  Component Value Date   HGBA1C 6.2* 02/01/2013   Lab Results  Component Value Date   CREATININE 0.70 02/03/2013

## 2013-02-19 NOTE — Progress Notes (Signed)
Pre-visit discussion using our clinic review tool. No additional management support is needed unless otherwise documented below in the visit note.  

## 2013-02-19 NOTE — Assessment & Plan Note (Signed)
Congratulated on cessation  

## 2013-02-20 ENCOUNTER — Telehealth: Payer: Self-pay | Admitting: Family Medicine

## 2013-02-20 NOTE — Telephone Encounter (Signed)
Relevant patient education mailed to patient.  

## 2013-03-04 DIAGNOSIS — Z87891 Personal history of nicotine dependence: Secondary | ICD-10-CM

## 2013-03-04 DIAGNOSIS — E119 Type 2 diabetes mellitus without complications: Secondary | ICD-10-CM

## 2013-03-04 DIAGNOSIS — J449 Chronic obstructive pulmonary disease, unspecified: Secondary | ICD-10-CM

## 2013-03-07 ENCOUNTER — Telehealth: Payer: Self-pay | Admitting: Family Medicine

## 2013-03-07 MED ORDER — TIOTROPIUM BROMIDE MONOHYDRATE 18 MCG IN CAPS: 18.0000 ug | ORAL_CAPSULE | Freq: Every day | RESPIRATORY_TRACT | Status: DC

## 2013-03-07 NOTE — Telephone Encounter (Signed)
Pt is requesting Rx refilled, done and pt notified

## 2013-03-10 ENCOUNTER — Telehealth: Payer: Self-pay | Admitting: Family Medicine

## 2013-03-10 NOTE — Telephone Encounter (Signed)
Relevant patient education mailed to patient.  

## 2013-03-13 ENCOUNTER — Other Ambulatory Visit: Payer: Self-pay | Admitting: *Deleted

## 2013-03-13 MED ORDER — ONETOUCH LANCETS MISC: Status: DC

## 2013-03-13 NOTE — Telephone Encounter (Signed)
Pt left voicemail with triage requesting refill of one touch lancets. done

## 2013-03-14 ENCOUNTER — Other Ambulatory Visit: Payer: Self-pay

## 2013-03-14 MED ORDER — TIOTROPIUM BROMIDE MONOHYDRATE 18 MCG IN CAPS: 18.0000 ug | ORAL_CAPSULE | Freq: Every day | RESPIRATORY_TRACT | Status: DC

## 2013-03-14 NOTE — Telephone Encounter (Signed)
Pt request status of one touch lancets to [facility]; advised sent electronically and pt will ck with pharmacy.

## 2013-03-14 NOTE — Telephone Encounter (Signed)
Pt left v/m; pt was told by [facility] that spiriva that was sent in on 03/07/13 cannot be sent to pt until 03/26/13.Desiree Floyd Pt only has 4 spiriva left and pt wants to know after using these 4 should she stop until get mail order Spiriva or can Spiriva be sent to [facility] until gets 03/26/13 order.Please advise.pt request cb.

## 2013-03-14 NOTE — Telephone Encounter (Signed)
plz notify I've sent in 1 mo supply to [facility] local [facility]

## 2013-03-15 NOTE — Telephone Encounter (Signed)
Pt is aware.  

## 2013-03-15 NOTE — Telephone Encounter (Signed)
Pt said [facility] had filled 03/07/13 Spiriva and pt did not want to get it at that [facility]. Pt called [facility] before lunch and told pharmacy to cancel order for spiriva so [facility] will go ahead and fill spiriva. Pt will contact [facility].

## 2013-04-02 ENCOUNTER — Ambulatory Visit: Payer: Medicare Other | Admitting: Family Medicine

## 2013-04-16 ENCOUNTER — Ambulatory Visit (INDEPENDENT_AMBULATORY_CARE_PROVIDER_SITE_OTHER): Payer: Medicare Other | Admitting: Family Medicine

## 2013-04-16 ENCOUNTER — Encounter: Payer: Self-pay | Admitting: Family Medicine

## 2013-04-16 VITALS — BP 122/76 | HR 84 | Temp 97.2°F | Wt 130.2 lb

## 2013-04-16 DIAGNOSIS — J449 Chronic obstructive pulmonary disease, unspecified: Secondary | ICD-10-CM

## 2013-04-16 DIAGNOSIS — E119 Type 2 diabetes mellitus without complications: Secondary | ICD-10-CM

## 2013-04-16 DIAGNOSIS — Z23 Encounter for immunization: Secondary | ICD-10-CM

## 2013-04-16 NOTE — Progress Notes (Signed)
Pre visit review using our clinic review tool, if applicable. No additional management support is needed unless otherwise documented below in the visit note. 

## 2013-04-16 NOTE — Addendum Note (Signed)
Addended by: Royann Shivers A on: 04/16/2013 09:56 AM   Modules accepted: Orders

## 2013-04-16 NOTE — Assessment & Plan Note (Addendum)
Rpt spirometry today.  Will likely d/c spiriva but await results of today's spirometry. Continued to encourage sustained smoking abstinence Spirometry today: FVC 137%, FEV1 159%, ratio 0.86 - anticipate "best" reading actually false reading. Regardless, will do trial off spiriva, continue symbicort daily with albuterol prn. prevnar today.

## 2013-04-16 NOTE — Patient Instructions (Signed)
Prevnar today. Spirometry today (pre-alb only) We will call you with results.  If we decide to stop spiriva, we will need to monitor respiratory infection flares and if recurrent, may need to restart spiriva. Congratulations on smoking cessation!  This is the best thing to do to keep your breathing optimized Continue watching sugars.

## 2013-04-16 NOTE — Assessment & Plan Note (Signed)
Anticipate actually prediabetes with recent sugar spike attributable to prednisone course.

## 2013-04-16 NOTE — Progress Notes (Signed)
BP 122/76  Pulse 84  Temp(Src) 97.2 F (36.2 C) (Oral)  Wt 130 lb 3.2 oz (59.058 kg)   CC: f/u visit for spirometry  Subjective:    Patient ID: Desiree Floyd, female    DOB: 1932/04/28, 80 y.o.   MRN: [id number]  HPI: LEE KALT is a 80 y.o. female presenting on 04/16/2013 with Follow-up   I asked Mrs Ishler to return today for spirometry.  Recent hospitalization for COPD exacerbation.  She quit 3 months ago.  She was started on spiriva and symbicort for maintenance of COPD. She is compliant with this regimen although ran out of spiriva for 1.5 weeks. She also has albuterol inhaler to use prn. symbicort costs 45$ per month.  spiriva costs 100$ per month. No wheezing, coughing or dyspnea.  Pneumovax (2009) Prevnar today.  Prediabetes - fasting sugars 90-100.    Relevant past medical, surgical, family and social history reviewed and updated as indicated.  Allergies and medications reviewed and updated. Current Outpatient Prescriptions on File Prior to Visit  Medication Sig  . acetaminophen (TYLENOL) 500 MG tablet Take 1,000 mg by mouth every 6 (six) hours as needed for moderate pain.  Marland Kitchen albuterol (PROVENTIL HFA;VENTOLIN HFA) 108 (90 BASE) MCG/ACT inhaler Inhale 2 puffs into the lungs every 6 (six) hours as needed for wheezing or shortness of breath.  . allopurinol (ZYLOPRIM) 300 MG tablet Take 1 tablet (300 mg total) by mouth daily.  Marland Kitchen amLODipine (NORVASC) 5 MG tablet Take 1 tablet (5 mg total) by mouth daily.  Marland Kitchen aspirin 81 MG tablet Take 81 mg by mouth daily.    . budesonide-formoterol (SYMBICORT) 160-4.5 MCG/ACT inhaler Inhale 2 puffs into the lungs 2 (two) times daily.  Marland Kitchen losartan-hydrochlorothiazide (HYZAAR) 50-12.5 MG per tablet Take 1 tablet by mouth daily.  . metoprolol tartrate (LOPRESSOR) 25 MG tablet Take one-half tablet by   mouth in the morning and  one-half tablet in the  evening  . nicotine (NICODERM CQ - DOSED IN MG/24 HOURS) 21 mg/24hr patch Place 1  patch (21 mg total) onto the skin daily.  . Omega-3 Fatty Acids (FISH OIL) 1000 MG CAPS Take 4 capsules by mouth daily.    . ONE TOUCH LANCETS MISC To check blood sugar daily as directed for DM 250.00  . pantoprazole (PROTONIX) 40 MG tablet Take 1 tablet (40 mg total) by mouth daily.  . simvastatin (ZOCOR) 20 MG tablet Take 1 tablet (20 mg total) by mouth at bedtime.  . vitamin B-12 (CYANOCOBALAMIN) 1000 MCG tablet Take 1,000 mcg by mouth daily.   No current facility-administered medications on file prior to visit.    Review of Systems Per HPI unless specifically indicated above    Objective:    BP 122/76  Pulse 84  Temp(Src) 97.2 F (36.2 C) (Oral)  Wt 130 lb 3.2 oz (59.058 kg)  Physical Exam  Nursing note and vitals reviewed. Constitutional: She appears well-developed and well-nourished. No distress.  HENT:  Mouth/Throat: Oropharynx is clear and moist. No oropharyngeal exudate.  Cardiovascular: Normal rate, regular rhythm, normal heart sounds and intact distal pulses.   No murmur heard. Pulmonary/Chest: Effort normal and breath sounds normal. No respiratory distress. She has no wheezes. She has no rales.  Slightly coarse bilat bases       Assessment & Plan:   Problem List Items Addressed This Visit   COPD (chronic obstructive pulmonary disease) - Primary     Rpt spirometry today.  Will likely d/c spiriva but  await results of today's spirometry. Continued to encourage sustained smoking abstinence Spirometry today: FVC 137%, FEV1 159%, ratio 0.86 - anticipate "best" reading actually false reading. Regardless, will do trial off spiriva, continue symbicort daily with albuterol prn. prevnar today.    Relevant Orders      Spirometry w/ graph (Completed)   Type 2 diabetes mellitus     Anticipate actually prediabetes with recent sugar spike attributable to prednisone course.        Follow up plan: Return as needed, for medicare wellness visit.

## 2013-04-17 ENCOUNTER — Telehealth: Payer: Self-pay | Admitting: Family Medicine

## 2013-04-17 NOTE — Telephone Encounter (Signed)
Relevant patient education mailed to patient.  

## 2013-05-21 ENCOUNTER — Telehealth: Payer: Self-pay

## 2013-05-21 NOTE — Telephone Encounter (Signed)
Pt wanted to know how to get refills from [facility] and pt still write a check for the meds. Advised pt to contact [facility] for refill and if refills needed [facility] will contact our office for refills and pt can discuss payment to [facility] also.pt voiced understanding.

## 2013-05-22 ENCOUNTER — Other Ambulatory Visit: Payer: Self-pay | Admitting: Family Medicine

## 2013-07-17 ENCOUNTER — Encounter: Payer: Self-pay | Admitting: Family Medicine

## 2013-07-17 ENCOUNTER — Ambulatory Visit (INDEPENDENT_AMBULATORY_CARE_PROVIDER_SITE_OTHER): Payer: Medicare Other | Admitting: Family Medicine

## 2013-07-17 VITALS — BP 140/70 | HR 74 | Temp 98.1°F | Wt 129.0 lb

## 2013-07-17 DIAGNOSIS — R599 Enlarged lymph nodes, unspecified: Secondary | ICD-10-CM

## 2013-07-17 DIAGNOSIS — R590 Localized enlarged lymph nodes: Secondary | ICD-10-CM | POA: Insufficient documentation

## 2013-07-17 NOTE — Progress Notes (Signed)
Pre visit review using our clinic review tool, if applicable. No additional management support is needed unless otherwise documented below in the visit note. 

## 2013-07-17 NOTE — Assessment & Plan Note (Signed)
Left submandibular LAD of 3 wks duration without localizing previous infection, in longtime smoker and h/o BCC at L lower lip excised 2014. LN not fluctuant, not consistent with infection. Will check Cr and CBC today in preparation for contrasted CT neck to schedule in next 1-2 days to r/o head and neck malignancy as cause of LN swelling. Pt agrees with plan.

## 2013-07-17 NOTE — Progress Notes (Signed)
BP 140/70  Pulse 74  Temp(Src) 98.1 F (36.7 C) (Oral)  Wt 129 lb (58.514 kg)  SpO2 93%   CC: knot L jaw  Subjective:    Patient ID: Desiree Floyd, female    DOB: 07-16-1932, 80 y.o.   MRN: [id number]  HPI: KORTNEE BAS is a 80 y.o. female presenting on 07/17/2013 for knot on left jaw bone   Noticed knot under L jaw for last 3 weeks. May have moved. Not painful to chew or limiting ability to open mouth. Tender when mashing on area.   Denies fevers/chills, no ear pain, headaches, no recent viral infection/cough/congestion, cold. Denies swollen glands.  No exposure to cats. H/o COPD and lifelong smoker but cutting down on smoking 1/4 ppd.  Complaint with symbicort bid.  H/o BCC L lower lip 06/2012 Lab Results  Component Value Date   TSH 1.57 03/16/2011   Lab Results  Component Value Date   CREATININE 0.70 02/03/2013   Wt Readings from Last 3 Encounters:  07/17/13 129 lb (58.514 kg)  04/16/13 130 lb 3.2 oz (59.058 kg)  02/19/13 122 lb 8 oz (55.566 kg)   Past Medical History  Diagnosis Date  . Hypertension   . Hyperlipidemia   . Diabetes mellitus 2012    diet controlled  . CAD (coronary artery disease) 2004    s/p stent [Jordan]  . Ex-smoker   . OA (osteoarthritis)     of hands  . CPPD (pseudo-gout)   . COPD (chronic obstructive pulmonary disease)     not on meds, FEV1 39%, FVC 49%, ratio 0.59, not reversible w SABA (05/2008)  . Asthma   . Allergy   . Urge incontinence 2008    s/p urodynamics [Dahlstedt]  . Benign breast lumps     knows breast lumps, told scar tissue from implants  . Gout     Deveshwar/Landau  . PAC (premature atrial contraction)     chronic  . DJD (degenerative joint disease) of lumbar spine   . Myocardial infarction  2004    "right before I had stent put in" (02/02/2013)  . Exertional shortness of breath   . BCC (basal cell carcinoma of skin) 2014    L lower lip s/p excision     Relevant past medical, surgical, family and social  history reviewed and updated as indicated.  Allergies and medications reviewed and updated. Current Outpatient Prescriptions on File Prior to Visit  Medication Sig  . acetaminophen (TYLENOL) 500 MG tablet Take 1,000 mg by mouth every 6 (six) hours as needed for moderate pain.  Marland Kitchen albuterol (PROVENTIL HFA;VENTOLIN HFA) 108 (90 BASE) MCG/ACT inhaler Inhale 2 puffs into the lungs every 6 (six) hours as needed for wheezing or shortness of breath.  . allopurinol (ZYLOPRIM) 300 MG tablet Take 1 tablet by mouth  daily  . amLODipine (NORVASC) 5 MG tablet Take 1 tablet by mouth  daily  . aspirin 81 MG tablet Take 81 mg by mouth daily.    . budesonide-formoterol (SYMBICORT) 160-4.5 MCG/ACT inhaler Inhale 2 puffs into the lungs 2 (two) times daily.  Marland Kitchen losartan-hydrochlorothiazide (HYZAAR) 50-12.5 MG per tablet Take 1 tablet by mouth  daily  . metoprolol tartrate (LOPRESSOR) 25 MG tablet Take one-half tablet by   mouth in the morning and  one-half tablet in the  evening  . Omega-3 Fatty Acids (FISH OIL) 1000 MG CAPS Take 4 capsules by mouth daily.    . ONE TOUCH LANCETS MISC To check blood sugar  daily as directed for DM 250.00  . pantoprazole (PROTONIX) 40 MG tablet Take 1 tablet (40 mg total) by mouth daily.  . simvastatin (ZOCOR) 20 MG tablet Take 1 tablet by mouth at  bedtime  . vitamin B-12 (CYANOCOBALAMIN) 1000 MCG tablet Take 1,000 mcg by mouth daily.   No current facility-administered medications on file prior to visit.    Review of Systems Per HPI unless specifically indicated above    Objective:    BP 140/70  Pulse 74  Temp(Src) 98.1 F (36.7 C) (Oral)  Wt 129 lb (58.514 kg)  SpO2 93%  Physical Exam  Nursing note and vitals reviewed. Constitutional: She appears well-developed and well-nourished. No distress.  HENT:  Head: Normocephalic and atraumatic.  Right Ear: Hearing, tympanic membrane, external ear and ear canal normal.  Left Ear: Hearing, tympanic membrane, external ear and  ear canal normal.  Nose: No mucosal edema or rhinorrhea. Right sinus exhibits no maxillary sinus tenderness and no frontal sinus tenderness. Left sinus exhibits no maxillary sinus tenderness and no frontal sinus tenderness.  Mouth/Throat: Uvula is midline, oropharynx is clear and moist and mucous membranes are normal. No oropharyngeal exudate, posterior oropharyngeal edema, posterior oropharyngeal erythema or tonsillar abscesses.  No tenderness at L lower teeth  Eyes: Conjunctivae and EOM are normal. Pupils are equal, round, and reactive to light. No scleral icterus.  Neck: Normal range of motion. Neck supple.  Cardiovascular: Normal rate, regular rhythm, normal heart sounds and intact distal pulses.   No murmur heard. Pulmonary/Chest: Effort normal and breath sounds normal. No respiratory distress. She has no wheezes. She has no rales.  Lymphadenopathy:       Head (right side): No submental, no submandibular, no tonsillar, no preauricular and no posterior auricular adenopathy present.       Head (left side): Submandibular (fixed matted ~2cm LN) adenopathy present. No submental, no tonsillar, no preauricular and no posterior auricular adenopathy present.    She has no cervical adenopathy.       Right: No supraclavicular adenopathy present.       Left: No supraclavicular adenopathy present.  Skin: Skin is warm and dry. No rash noted.       Assessment & Plan:   Problem List Items Addressed This Visit   Lymphadenopathy, submandibular - Primary     Left submandibular LAD of 3 wks duration without localizing previous infection, in longtime smoker and h/o BCC at L lower lip excised 2014. LN not fluctuant, not consistent with infection. Will check Cr and CBC today in preparation for contrasted CT neck to schedule in next 1-2 days to r/o head and neck malignancy as cause of LN swelling. Pt agrees with plan.    Relevant Orders      Basic metabolic panel      CBC with Differential      CT Soft  Tissue Neck W Contrast       Follow up plan: Return if symptoms worsen or fail to improve.

## 2013-07-17 NOTE — Patient Instructions (Signed)
I'd like to check blood work today and then pass by [facility] office to schedule CT scan hopefully for tomorrow. We may refer you to ENT depending on results of CT scan.

## 2013-07-18 LAB — CBC WITH DIFFERENTIAL/PLATELET
Basophils Absolute: 0.1 10*3/uL (ref 0.0–0.1)
Basophils Relative: 0.6 % (ref 0.0–3.0)
EOS PCT: 3 % (ref 0.0–5.0)
Eosinophils Absolute: 0.3 10*3/uL (ref 0.0–0.7)
HCT: 44.7 % (ref 36.0–46.0)
HEMOGLOBIN: 14.9 g/dL (ref 12.0–15.0)
Lymphocytes Relative: 34.1 % (ref 12.0–46.0)
Lymphs Abs: 3.8 10*3/uL (ref 0.7–4.0)
MCHC: 33.3 g/dL (ref 30.0–36.0)
MCV: 101.1 fl — ABNORMAL HIGH (ref 78.0–100.0)
MONOS PCT: 10.8 % (ref 3.0–12.0)
Monocytes Absolute: 1.2 10*3/uL — ABNORMAL HIGH (ref 0.1–1.0)
Neutro Abs: 5.8 10*3/uL (ref 1.4–7.7)
Neutrophils Relative %: 51.5 % (ref 43.0–77.0)
PLATELETS: 212 10*3/uL (ref 150.0–400.0)
RBC: 4.42 Mil/uL (ref 3.87–5.11)
RDW: 14.8 % (ref 11.5–15.5)
WBC: 11.2 10*3/uL — AB (ref 4.0–10.5)

## 2013-07-18 LAB — BASIC METABOLIC PANEL
BUN: 18 mg/dL (ref 6–23)
CO2: 30 mEq/L (ref 19–32)
Calcium: 9.6 mg/dL (ref 8.4–10.5)
Chloride: 105 mEq/L (ref 96–112)
Creatinine, Ser: 1.1 mg/dL (ref 0.4–1.2)
GFR: 53.8 mL/min — AB (ref >60.00)
Glucose, Bld: 97 mg/dL (ref 70–99)
POTASSIUM: 3.9 meq/L (ref 3.5–5.1)
Sodium: 141 mEq/L (ref 135–145)

## 2013-07-20 ENCOUNTER — Ambulatory Visit (INDEPENDENT_AMBULATORY_CARE_PROVIDER_SITE_OTHER)
Admission: RE | Admit: 2013-07-20 | Discharge: 2013-07-20 | Disposition: A | Discharge status: Home / Self Care | Payer: Medicare Other | Source: Ambulatory Visit | Attending: Family Medicine | Admitting: Family Medicine

## 2013-07-20 ENCOUNTER — Other Ambulatory Visit: Payer: Self-pay | Admitting: Family Medicine

## 2013-07-20 DIAGNOSIS — K118 Other diseases of salivary glands: Secondary | ICD-10-CM

## 2013-07-20 DIAGNOSIS — R590 Localized enlarged lymph nodes: Secondary | ICD-10-CM

## 2013-07-20 DIAGNOSIS — R599 Enlarged lymph nodes, unspecified: Secondary | ICD-10-CM

## 2013-07-20 MED ORDER — IOHEXOL 300 MG/ML  SOLN
75.0000 mL | Freq: Once | INTRAMUSCULAR | Status: AC | PRN
  Administered 2013-07-20: 75 mL via INTRAVENOUS

## 2013-07-23 ENCOUNTER — Other Ambulatory Visit: Payer: Self-pay | Admitting: Otolaryngology

## 2013-07-23 ENCOUNTER — Other Ambulatory Visit (HOSPITAL_COMMUNITY)
Admission: RE | Admit: 2013-07-23 | Discharge: 2013-07-23 | Disposition: A | Discharge status: Home / Self Care | Payer: Medicare Other | Source: Ambulatory Visit | Attending: Otolaryngology | Admitting: Otolaryngology

## 2013-07-23 DIAGNOSIS — R599 Enlarged lymph nodes, unspecified: Secondary | ICD-10-CM | POA: Insufficient documentation

## 2013-07-23 DIAGNOSIS — K118 Other diseases of salivary glands: Secondary | ICD-10-CM | POA: Insufficient documentation

## 2013-07-24 ENCOUNTER — Encounter: Payer: Self-pay | Admitting: Family Medicine

## 2013-07-27 ENCOUNTER — Ambulatory Visit: Payer: Medicare Other | Admitting: Internal Medicine

## 2013-07-27 ENCOUNTER — Ambulatory Visit (INDEPENDENT_AMBULATORY_CARE_PROVIDER_SITE_OTHER): Payer: Medicare Other | Admitting: Internal Medicine

## 2013-07-27 ENCOUNTER — Encounter: Payer: Self-pay | Admitting: Internal Medicine

## 2013-07-27 VITALS — BP 120/58 | HR 72 | Temp 98.1°F | Wt 129.0 lb

## 2013-07-27 DIAGNOSIS — H5789 Other specified disorders of eye and adnexa: Secondary | ICD-10-CM

## 2013-07-27 NOTE — Progress Notes (Signed)
Subjective:    Patient ID: Desiree Floyd, female    DOB: 02-20-1932, 80 y.o.   MRN: [id number]  HPI  Pt presents to the clinic today with c/o left eye swelling. She reports this started 2 days ago. She denies burning sensation or foreign body in the eye. It does itch a little bit. She denies drainage from the eye, fever, chills or body aches. She has tried warm compresses with minimal relief.  Review of Systems      Past Medical History  Diagnosis Date  . Hypertension   . Hyperlipidemia   . Diabetes mellitus 2012    diet controlled  . CAD (coronary artery disease) 2004    s/p stent [Jordan]  . Ex-smoker   . OA (osteoarthritis)     of hands  . CPPD (pseudo-gout)   . COPD (chronic obstructive pulmonary disease)     not on meds, FEV1 39%, FVC 49%, ratio 0.59, not reversible w SABA (05/2008)  . Asthma   . Allergy   . Urge incontinence 2008    s/p urodynamics [Dahlstedt]  . Benign breast lumps     knows breast lumps, told scar tissue from implants  . Gout     Deveshwar/Landau  . PAC (premature atrial contraction)     chronic  . DJD (degenerative joint disease) of lumbar spine   . Myocardial infarction  2004    "right before I had stent put in" (02/02/2013)  . Exertional shortness of breath   . BCC (basal cell carcinoma of skin) 2014    L lower lip s/p excision  . Mass of left submandibular region 2015    s/p aspiration by ENT    Current Outpatient Prescriptions  Medication Sig Dispense Refill  . acetaminophen (TYLENOL) 500 MG tablet Take 1,000 mg by mouth every 6 (six) hours as needed for moderate pain.      Marland Kitchen albuterol (PROVENTIL HFA;VENTOLIN HFA) 108 (90 BASE) MCG/ACT inhaler Inhale 2 puffs into the lungs every 6 (six) hours as needed for wheezing or shortness of breath.  1 Inhaler  3  . allopurinol (ZYLOPRIM) 300 MG tablet Take 1 tablet by mouth  daily  90 tablet  1  . amLODipine (NORVASC) 5 MG tablet Take 1 tablet by mouth  daily  90 tablet  1  . aspirin 81 MG  tablet Take 81 mg by mouth daily.        . budesonide-formoterol (SYMBICORT) 160-4.5 MCG/ACT inhaler Inhale 2 puffs into the lungs 2 (two) times daily.  1 Inhaler  12  . losartan-hydrochlorothiazide (HYZAAR) 50-12.5 MG per tablet Take 1 tablet by mouth  daily  90 tablet  1  . metoprolol tartrate (LOPRESSOR) 25 MG tablet Take one-half tablet by   mouth in the morning and  one-half tablet in the  evening  90 tablet  2  . Omega-3 Fatty Acids (FISH OIL) 1000 MG CAPS Take 4 capsules by mouth daily.        . ONE TOUCH LANCETS MISC To check blood sugar daily as directed for DM 250.00  200 each  1  . pantoprazole (PROTONIX) 40 MG tablet Take 1 tablet (40 mg total) by mouth daily.  30 tablet  1  . simvastatin (ZOCOR) 20 MG tablet Take 1 tablet by mouth at  bedtime  90 tablet  1  . vitamin B-12 (CYANOCOBALAMIN) 1000 MCG tablet Take 1,000 mcg by mouth daily.       No current facility-administered medications for this visit.  Allergies  Allergen Reactions  . Augmentin [Amoxicillin-Pot Clavulanate] Other (See Comments)    Diarrhea and vomiting  . Lisinopril Cough    Family History  Problem Relation Age of Onset  . Heart disease Father 62    CAD AND HEART ATTACK  . Other Mother     old age, age 65  . Heart disease Mother     History   Social History  . Marital Status: Married    Spouse Name: N/A    Number of Children: 2  . Years of Education: N/A   Occupational History  . [place]    Social History Main Topics  . Smoking status: Current Some Day Smoker -- 0.75 packs/day for 58 years  . Smokeless tobacco: Never Used  . Alcohol Use: Yes     Comment: 02/02/2013 "maybe a glass of wine twice/yr"  . Drug Use: No  . Sexual Activity: Not on file   Other Topics Concern  . Not on file   Social History Narrative   DECAF COFFEE      LIVES WITH HUSBAND AND NO PETS     Constitutional: Denies fever, malaise, fatigue, headache or abrupt weight changes.    HEENT: Pt reports swelling of left eye. Denies eye pain, eye redness, ear pain, ringing in the ears, wax buildup, runny nose, nasal congestion, bloody nose, or sore throat.   No other specific complaints in a complete review of systems (except as listed in HPI above).  Objective:   Physical Exam   BP 120/58  Pulse 72  Temp(Src) 98.1 F (36.7 C) (Oral)  Wt 129 lb (58.514 kg)  SpO2 96% Wt Readings from Last 3 Encounters:  07/27/13 129 lb (58.514 kg)  07/17/13 129 lb (58.514 kg)  04/16/13 130 lb 3.2 oz (59.058 kg)    General: Appears her stated age, well developed, well nourished in NAD. HEENT: Head: normal shape and size; Eyes: sclera white, no icterus, conjunctiva pink, PERRLA and EOMs intact;Chalazion noted of left upper eyelid Cardiovascular: Normal rate and rhythm. S1,S2 noted.  No murmur, rubs or gallops noted. No JVD or BLE edema. No carotid bruits noted. Pulmonary/Chest: Normal effort and positive vesicular breath sounds. No respiratory distress. No wheezes, rales or ronchi noted.    BMET    Component Value Date/Time   NA 141 07/17/2013 1633   K 3.9 07/17/2013 1633   CL 105 07/17/2013 1633   CO2 30 07/17/2013 1633   GLUCOSE 97 07/17/2013 1633   BUN 18 07/17/2013 1633   CREATININE 1.1 07/17/2013 1633   CALCIUM 9.6 07/17/2013 1633   GFRNONAA 81* 02/03/2013 0611   GFRAA >90 02/03/2013 0611    Lipid Panel     Component Value Date/Time   CHOL 127 02/24/2012 1640   TRIG 121.0 02/24/2012 1640   HDL 43.10 02/24/2012 1640   CHOLHDL 3 02/24/2012 1640   VLDL 24.2 02/24/2012 1640   LDLCALC 60 02/24/2012 1640    CBC    Component Value Date/Time   WBC 11.2* 07/17/2013 1633   RBC 4.42 07/17/2013 1633   HGB 14.9 07/17/2013 1633   HCT 44.7 07/17/2013 1633   PLT 212.0 07/17/2013 1633   MCV 101.1* 07/17/2013 1633   MCH 33.7 02/02/2013 0410   MCHC 33.3 07/17/2013 1633   RDW 14.8 07/17/2013 1633   LYMPHSABS 3.8 07/17/2013 1633   MONOABS 1.2* 07/17/2013 1633   EOSABS 0.3 07/17/2013 1633   BASOSABS 0.1  07/17/2013 1633    Hgb A1C  Lab Results  Component Value Date   HGBA1C 6.2* 02/01/2013        Assessment & Plan:  Left eye swelling secondary to chalazion:  Try ibuprofen OTC for swelling/pain Warm compresses TID If no improvement, may need I and D by optho  RTC as needed or if symptoms persist or worsen

## 2013-07-27 NOTE — Patient Instructions (Addendum)
Chalazion A chalazion is a swelling or hard lump on the eyelid caused by a blocked oil gland. Chalazions may occur on the upper or the lower eyelid.  CAUSES  Oil gland in the eyelid becomes blocked. SYMPTOMS   Swelling or hard lump on the eyelid. This lump may make it hard to see out of the eye.  The swelling may spread to areas around the eye. TREATMENT   Although some chalazions disappear by themselves in 1 or 2 months, some chalazions may need to be removed.  Medicines to treat an infection may be required. HOME CARE INSTRUCTIONS   Wash your hands often and dry them with a clean towel. Do not touch the chalazion.  Apply heat to the eyelid several times a day for 10 minutes to help ease discomfort and bring any yellowish white fluid (pus) to the surface. One way to apply heat to a chalazion is to use the handle of a metal spoon.  Hold the handle under hot water until it is hot, and then wrap the handle in paper towels so that the heat can come through without burning your skin.  Hold the wrapped handle against the chalazion and reheat the spoon handle as needed.  Apply heat in this fashion for 10 minutes, 4 times per day.  Return to your caregiver to have the pus removed if it does not break (rupture) on its own.  Do not try to remove the pus yourself by squeezing the chalazion or sticking it with a pin or needle.  Only take over-the-counter or prescription medicines for pain, discomfort, or fever as directed by your caregiver. SEEK IMMEDIATE MEDICAL CARE IF:   You have pain in your eye.  Your vision changes.  The chalazion does not go away.  The chalazion becomes painful, red, or swollen, grows larger, or does not start to disappear after 2 weeks. MAKE SURE YOU:   Understand these instructions.  Will watch your condition.  Will get help right away if you are not doing well or get worse. Document Released: 01/23/2000 Document Revised: 04/19/2011 Document Reviewed:  05/12/2009 ExitCare Patient Information 2015 [employer]. This information is not intended to replace advice given to you by your health care provider. Make sure you discuss any questions you have with your health care provider.  

## 2013-07-27 NOTE — Progress Notes (Signed)
Pre visit review using our clinic review tool, if applicable. No additional management support is needed unless otherwise documented below in the visit note. 

## 2013-08-17 ENCOUNTER — Other Ambulatory Visit: Payer: Self-pay | Admitting: Otolaryngology

## 2013-09-12 ENCOUNTER — Other Ambulatory Visit: Payer: Self-pay | Admitting: Otolaryngology

## 2013-09-18 ENCOUNTER — Encounter (HOSPITAL_COMMUNITY): Payer: Self-pay | Admitting: Pharmacy Technician

## 2013-09-20 ENCOUNTER — Encounter (HOSPITAL_COMMUNITY)
Admission: RE | Admit: 2013-09-20 | Discharge: 2013-09-20 | Disposition: A | Discharge status: Home / Self Care | Payer: Medicare Other | Source: Ambulatory Visit | Attending: Otolaryngology | Admitting: Otolaryngology

## 2013-09-20 ENCOUNTER — Encounter (HOSPITAL_COMMUNITY): Payer: Self-pay

## 2013-09-20 DIAGNOSIS — I10 Essential (primary) hypertension: Secondary | ICD-10-CM | POA: Insufficient documentation

## 2013-09-20 DIAGNOSIS — J449 Chronic obstructive pulmonary disease, unspecified: Secondary | ICD-10-CM | POA: Diagnosis not present

## 2013-09-20 DIAGNOSIS — E119 Type 2 diabetes mellitus without complications: Secondary | ICD-10-CM | POA: Insufficient documentation

## 2013-09-20 DIAGNOSIS — Z01818 Encounter for other preprocedural examination: Secondary | ICD-10-CM | POA: Diagnosis not present

## 2013-09-20 DIAGNOSIS — M810 Age-related osteoporosis without current pathological fracture: Secondary | ICD-10-CM | POA: Insufficient documentation

## 2013-09-20 DIAGNOSIS — J4489 Other specified chronic obstructive pulmonary disease: Secondary | ICD-10-CM | POA: Insufficient documentation

## 2013-09-20 DIAGNOSIS — E785 Hyperlipidemia, unspecified: Secondary | ICD-10-CM | POA: Insufficient documentation

## 2013-09-20 DIAGNOSIS — F17200 Nicotine dependence, unspecified, uncomplicated: Secondary | ICD-10-CM | POA: Diagnosis not present

## 2013-09-20 DIAGNOSIS — I252 Old myocardial infarction: Secondary | ICD-10-CM | POA: Insufficient documentation

## 2013-09-20 DIAGNOSIS — I2510 Atherosclerotic heart disease of native coronary artery without angina pectoris: Secondary | ICD-10-CM | POA: Diagnosis not present

## 2013-09-20 DIAGNOSIS — Z01812 Encounter for preprocedural laboratory examination: Secondary | ICD-10-CM | POA: Insufficient documentation

## 2013-09-20 DIAGNOSIS — Z9861 Coronary angioplasty status: Secondary | ICD-10-CM | POA: Diagnosis not present

## 2013-09-20 LAB — CBC
HCT: 45.8 % (ref 36.0–46.0)
Hemoglobin: 15.2 g/dL — ABNORMAL HIGH (ref 12.0–15.0)
MCH: 33.6 pg (ref 26.0–34.0)
MCHC: 33.2 g/dL (ref 30.0–36.0)
MCV: 101.3 fL — ABNORMAL HIGH (ref 78.0–100.0)
PLATELETS: 186 10*3/uL (ref 150–400)
RBC: 4.52 MIL/uL (ref 3.87–5.11)
RDW: 13.9 % (ref 11.5–15.5)
WBC: 9.2 10*3/uL (ref 4.0–10.5)

## 2013-09-20 LAB — BASIC METABOLIC PANEL
Anion gap: 14 (ref 5–15)
BUN: 13 mg/dL (ref 6–23)
CHLORIDE: 102 meq/L (ref 96–112)
CO2: 26 meq/L (ref 19–32)
Calcium: 9.1 mg/dL (ref 8.4–10.5)
Creatinine, Ser: 0.59 mg/dL (ref 0.50–1.10)
GFR calc Af Amer: >90 mL/min (ref >90)
GFR calc non Af Amer: 86 mL/min — ABNORMAL LOW (ref >90)
Glucose, Bld: 134 mg/dL — ABNORMAL HIGH (ref 70–99)
POTASSIUM: 4.8 meq/L (ref 3.7–5.3)
SODIUM: 142 meq/L (ref 137–147)

## 2013-09-20 NOTE — Progress Notes (Signed)
Nurse called Ebony Hail, PA to see if patient needed a 2 view chest since patient had a 1 view chest xray in December 2014. Ebony Hail, [place] stated a 1 view should be ok since patient had a CT scan, however if the assigned anesthesiologist wanted a 2 view then it could be done DOS.

## 2013-09-20 NOTE — Pre-Procedure Instructions (Signed)
Desiree Floyd  09/20/2013   Your procedure is scheduled on:  Thursday September 27, 2013 at 10:15 AM.  Report to [facility] Admitting at 8:15 AM.  Call this number if you have problems the morning of surgery: [phone]   Remember:   Do not eat food or drink liquids after midnight.   Take these medicines the morning of surgery with A SIP OF WATER: Acetaminophen (Tylenol) if needed, Albuterol (Proventil) inhaler if needed, Amlodipine (Norvasc), Symbicort inhaler, and Metoprolol (Lopressor)    Do not wear jewelry, make-up or nail polish.  Do not wear lotions, powders, or perfumes. You NOT may wear deodorant.  Do not shave 48 hours prior to surgery.   Do not bring valuables to the hospital.  [facility] is not responsible for any belongings or valuables.               Contacts, dentures or bridgework may not be worn into surgery.  Leave suitcase in the car. After surgery it may be brought to your room.  For patients admitted to the hospital, discharge time is determined by your  treatment team.               Patients discharged the day of surgery will not be allowed to drive home.  Name and phone number of your driver: Family/Friend  Special Instructions: Shower using CHG soap the night before and the morning of your surgery   Please read over the following fact sheets that you were given: Pain Booklet, Coughing and Deep Breathing and Surgical Site Infection Prevention

## 2013-09-20 NOTE — Progress Notes (Signed)
PCP is Dr. Danise Mina and Cardiologist is Dr. Martinique. Patient denied having any acute cardiac or pulmonary issues. Husband at chairside during PAT visit.

## 2013-09-20 NOTE — Progress Notes (Signed)
Nurse called Holley Raring at Dr. [name] office and requested that Dr. [name] sign orders at his earliest convenience as the orders were under "signed and held." She stated "ok."

## 2013-09-21 NOTE — Progress Notes (Signed)
Anesthesia Chart Review:  Patient is a 80 year old female scheduled for left suprahyoid dissection on 09/27/13 by Dr. [name]. Case is posted for 2 hours.  History includes left submandibular mass, left lower lip BCC s/p excision '14, smoking, COPD with last hospitalization for exacerbation 01/2013, HTN, CAD/lateral MI '04 s/p LCX DES, PAC's, diet controlled DM2, HLD, pseudogout, asthma, exertional dyspnea, tonsillectomy, breast augmentation, osteoporosis. PCP is Dr. Danise Mina. Cardiologist is Dr. Peter Martinique, last visit 08/16/12. No additional testing was recommended at that time. Her next visit is scheduled for 10/09/13. She denied any chest pain or acute pulmonary symptoms to her PAT RN.  Nuclear stress test on 06/30/11 showed: Overall Impression: Normal stress nuclear study. LV Ejection Fraction: 90%. LV Wall Motion: NL LV Function; NL Wall Motion.  EKG on 02/01/13 showed: NSR, PACs, non-specific ST abnormality.  According to notes in  Epic, "coronary angiography [on 11/08/02] demonstrated a 90-95% stenosis in the mid left circumflex coronary artery. There was a 70-80% proximal right coronary stenosis with 50% disease in the distal right coronary artery. Left ventricular contractility was normal. Ejection fraction estimated at 70%."  Spirometry 04/16/13 "FVC 137%, FEV1 159%, ratio 0.86 - anticipate "best" reading actually false reading." (Dr. Danise Mina)  1V CXR on 02/01/13 showed: COPD. No acute cardiopulmonary disease.  CT of the neck on 07/20/13 showed:  IMPRESSION:  1. 2 cm cystic mass within the left submandibular space, just anterior to, and separate from, the left submandibular gland. This could potentially be arising from the floor of the mouth, making a diving ranula a possible etiology. However, its appearance is more suggestive of a low-grade cystic neoplasm or cystic degeneration of an enlarged lymph node.  2. No mucosal lesion is seen to suggest a primary malignancy.  3. There are no other  masses or evidence of adenopathy.  Preoperative labs noted.  Chart reviewed with anesthesiologist Dr. Linna Caprice.  Patient with cardiology follow-up just over a year ago with a stress test just over two years ago.  No new CV symptoms reported at her PAT visit. She will be further evaluated by her assigned anesthesiologist on the day of surgery. If no acute cardiopulmonary symptoms then it is anticipated that she can proceed as planned.  George Hugh [facility] Short Stay Center/Anesthesiology Phone [phone] 09/21/2013 6:36 PM

## 2013-09-27 ENCOUNTER — Encounter (HOSPITAL_COMMUNITY): Payer: Medicare Other | Admitting: Vascular Surgery

## 2013-09-27 ENCOUNTER — Encounter (HOSPITAL_COMMUNITY): Payer: Self-pay | Admitting: *Deleted

## 2013-09-27 ENCOUNTER — Encounter (HOSPITAL_COMMUNITY)
Admission: RE | Disposition: A | Discharge status: Home / Self Care | Payer: Self-pay | Source: Ambulatory Visit | Attending: Otolaryngology

## 2013-09-27 ENCOUNTER — Observation Stay (HOSPITAL_COMMUNITY)
Admission: RE | Admit: 2013-09-27 | Discharge: 2013-09-28 | Disposition: A | Discharge status: Home / Self Care | Payer: Medicare Other | Source: Ambulatory Visit | Attending: Otolaryngology | Admitting: Otolaryngology

## 2013-09-27 ENCOUNTER — Inpatient Hospital Stay (HOSPITAL_COMMUNITY): Payer: Medicare Other | Admitting: Certified Registered"

## 2013-09-27 DIAGNOSIS — I2510 Atherosclerotic heart disease of native coronary artery without angina pectoris: Secondary | ICD-10-CM | POA: Diagnosis not present

## 2013-09-27 DIAGNOSIS — Z881 Allergy status to other antibiotic agents status: Secondary | ICD-10-CM | POA: Diagnosis not present

## 2013-09-27 DIAGNOSIS — J45909 Unspecified asthma, uncomplicated: Secondary | ICD-10-CM | POA: Diagnosis not present

## 2013-09-27 DIAGNOSIS — Z79899 Other long term (current) drug therapy: Secondary | ICD-10-CM | POA: Diagnosis not present

## 2013-09-27 DIAGNOSIS — E785 Hyperlipidemia, unspecified: Secondary | ICD-10-CM | POA: Diagnosis not present

## 2013-09-27 DIAGNOSIS — I491 Atrial premature depolarization: Secondary | ICD-10-CM | POA: Insufficient documentation

## 2013-09-27 DIAGNOSIS — R221 Localized swelling, mass and lump, neck: Secondary | ICD-10-CM | POA: Diagnosis present

## 2013-09-27 DIAGNOSIS — M19049 Primary osteoarthritis, unspecified hand: Secondary | ICD-10-CM | POA: Insufficient documentation

## 2013-09-27 DIAGNOSIS — N3941 Urge incontinence: Secondary | ICD-10-CM | POA: Insufficient documentation

## 2013-09-27 DIAGNOSIS — Z87891 Personal history of nicotine dependence: Secondary | ICD-10-CM | POA: Insufficient documentation

## 2013-09-27 DIAGNOSIS — C50919 Malignant neoplasm of unspecified site of unspecified female breast: Secondary | ICD-10-CM | POA: Diagnosis not present

## 2013-09-27 DIAGNOSIS — E119 Type 2 diabetes mellitus without complications: Secondary | ICD-10-CM | POA: Insufficient documentation

## 2013-09-27 DIAGNOSIS — Z9889 Other specified postprocedural states: Secondary | ICD-10-CM

## 2013-09-27 DIAGNOSIS — M5137 Other intervertebral disc degeneration, lumbosacral region: Secondary | ICD-10-CM | POA: Diagnosis not present

## 2013-09-27 DIAGNOSIS — I10 Essential (primary) hypertension: Secondary | ICD-10-CM | POA: Diagnosis not present

## 2013-09-27 DIAGNOSIS — Z888 Allergy status to other drugs, medicaments and biological substances status: Secondary | ICD-10-CM | POA: Insufficient documentation

## 2013-09-27 DIAGNOSIS — M51379 Other intervertebral disc degeneration, lumbosacral region without mention of lumbar back pain or lower extremity pain: Secondary | ICD-10-CM | POA: Insufficient documentation

## 2013-09-27 DIAGNOSIS — Z9089 Acquired absence of other organs: Secondary | ICD-10-CM | POA: Insufficient documentation

## 2013-09-27 DIAGNOSIS — I252 Old myocardial infarction: Secondary | ICD-10-CM | POA: Insufficient documentation

## 2013-09-27 DIAGNOSIS — Z859 Personal history of malignant neoplasm, unspecified: Secondary | ICD-10-CM | POA: Diagnosis present

## 2013-09-27 DIAGNOSIS — Z9849 Cataract extraction status, unspecified eye: Secondary | ICD-10-CM | POA: Diagnosis not present

## 2013-09-27 DIAGNOSIS — R220 Localized swelling, mass and lump, head: Secondary | ICD-10-CM | POA: Diagnosis present

## 2013-09-27 DIAGNOSIS — Z85828 Personal history of other malignant neoplasm of skin: Secondary | ICD-10-CM | POA: Insufficient documentation

## 2013-09-27 DIAGNOSIS — Z7982 Long term (current) use of aspirin: Secondary | ICD-10-CM | POA: Insufficient documentation

## 2013-09-27 DIAGNOSIS — M1120 Other chondrocalcinosis, unspecified site: Secondary | ICD-10-CM

## 2013-09-27 DIAGNOSIS — C779 Secondary and unspecified malignant neoplasm of lymph node, unspecified: Principal | ICD-10-CM

## 2013-09-27 HISTORY — PX: RADICAL NECK DISSECTION: SHX2284

## 2013-09-27 LAB — GLUCOSE, CAPILLARY
Glucose-Capillary: 133 mg/dL — ABNORMAL HIGH (ref 70–99)
Glucose-Capillary: 145 mg/dL — ABNORMAL HIGH (ref 70–99)

## 2013-09-27 LAB — CBC
HCT: 37.5 % (ref 36.0–46.0)
Hemoglobin: 12.1 g/dL (ref 12.0–15.0)
MCH: 32 pg (ref 26.0–34.0)
MCHC: 32.3 g/dL (ref 30.0–36.0)
MCV: 99.2 fL (ref 78.0–100.0)
PLATELETS: 169 10*3/uL (ref 150–400)
RBC: 3.78 MIL/uL — ABNORMAL LOW (ref 3.87–5.11)
RDW: 14 % (ref 11.5–15.5)
WBC: 7.8 10*3/uL (ref 4.0–10.5)

## 2013-09-27 LAB — CREATININE, SERUM
Creatinine, Ser: 0.77 mg/dL (ref 0.50–1.10)
GFR calc Af Amer: >90 mL/min (ref >90)
GFR, EST NON AFRICAN AMERICAN: 78 mL/min — AB (ref >90)

## 2013-09-27 SURGERY — DISSECTION, NECK, RADICAL
Anesthesia: General | Site: Neck | Laterality: Left

## 2013-09-27 MED ORDER — BUDESONIDE-FORMOTEROL FUMARATE 160-4.5 MCG/ACT IN AERO
2.0000 | INHALATION_SPRAY | Freq: Every day | RESPIRATORY_TRACT | Status: DC
  Administered 2013-09-28: 2 via RESPIRATORY_TRACT
  Filled 2013-09-27 (×2): qty 6

## 2013-09-27 MED ORDER — MIDAZOLAM HCL 2 MG/2ML IJ SOLN
INTRAMUSCULAR | Status: AC
  Filled 2013-09-27: qty 2

## 2013-09-27 MED ORDER — MIDAZOLAM HCL 2 MG/2ML IJ SOLN
INTRAMUSCULAR | Status: DC | PRN
  Administered 2013-09-27: 1 mg via INTRAVENOUS

## 2013-09-27 MED ORDER — OXYCODONE HCL 5 MG/5ML PO SOLN: 5.0000 mg | Freq: Once | ORAL | Status: DC | PRN

## 2013-09-27 MED ORDER — ALLOPURINOL 300 MG PO TABS
300.0000 mg | ORAL_TABLET | Freq: Every day | ORAL | Status: DC
  Filled 2013-09-27: qty 1

## 2013-09-27 MED ORDER — LIDOCAINE HCL (CARDIAC) 20 MG/ML IV SOLN
INTRAVENOUS | Status: AC
  Filled 2013-09-27: qty 5

## 2013-09-27 MED ORDER — EPHEDRINE SULFATE 50 MG/ML IJ SOLN
INTRAMUSCULAR | Status: AC
  Filled 2013-09-27: qty 1

## 2013-09-27 MED ORDER — SODIUM CHLORIDE 0.9 % IJ SOLN
INTRAMUSCULAR | Status: AC
  Filled 2013-09-27: qty 10

## 2013-09-27 MED ORDER — HEPARIN SODIUM (PORCINE) 5000 UNIT/ML IJ SOLN
5000.0000 [IU] | Freq: Three times a day (TID) | INTRAMUSCULAR | Status: DC
  Administered 2013-09-27 – 2013-09-28 (×2): 5000 [IU] via SUBCUTANEOUS
  Filled 2013-09-27 (×4): qty 1

## 2013-09-27 MED ORDER — CLINDAMYCIN PHOSPHATE 600 MG/50ML IV SOLN
600.0000 mg | Freq: Four times a day (QID) | INTRAVENOUS | Status: AC
  Administered 2013-09-27 – 2013-09-28 (×3): 600 mg via INTRAVENOUS
  Filled 2013-09-27 (×3): qty 50

## 2013-09-27 MED ORDER — ALBUTEROL SULFATE (2.5 MG/3ML) 0.083% IN NEBU: 2.5000 mg | INHALATION_SOLUTION | Freq: Four times a day (QID) | RESPIRATORY_TRACT | Status: DC | PRN

## 2013-09-27 MED ORDER — ALBUTEROL SULFATE HFA 108 (90 BASE) MCG/ACT IN AERS: 2.0000 | INHALATION_SPRAY | Freq: Four times a day (QID) | RESPIRATORY_TRACT | Status: DC | PRN

## 2013-09-27 MED ORDER — FENTANYL CITRATE 0.05 MG/ML IJ SOLN
INTRAMUSCULAR | Status: DC | PRN
  Administered 2013-09-27 (×2): 50 ug via INTRAVENOUS
  Administered 2013-09-27: 100 ug via INTRAVENOUS

## 2013-09-27 MED ORDER — SUCCINYLCHOLINE CHLORIDE 20 MG/ML IJ SOLN
INTRAMUSCULAR | Status: DC | PRN
  Administered 2013-09-27: 100 mg via INTRAVENOUS

## 2013-09-27 MED ORDER — LACTATED RINGERS IV SOLN
INTRAVENOUS | Status: DC
  Administered 2013-09-27 (×2): via INTRAVENOUS

## 2013-09-27 MED ORDER — BACITRACIN ZINC 500 UNIT/GM EX OINT
TOPICAL_OINTMENT | CUTANEOUS | Status: AC
Start: 2013-09-27 — End: 2013-09-27
  Filled 2013-09-27: qty 15

## 2013-09-27 MED ORDER — BACITRACIN ZINC 500 UNIT/GM EX OINT
TOPICAL_OINTMENT | CUTANEOUS | Status: DC | PRN
  Administered 2013-09-27: 1 via TOPICAL

## 2013-09-27 MED ORDER — METOPROLOL TARTRATE 12.5 MG HALF TABLET
12.5000 mg | ORAL_TABLET | Freq: Two times a day (BID) | ORAL | Status: DC
  Administered 2013-09-27: 12.5 mg via ORAL
  Filled 2013-09-27 (×3): qty 1

## 2013-09-27 MED ORDER — LIDOCAINE HCL (CARDIAC) 20 MG/ML IV SOLN
INTRAVENOUS | Status: DC | PRN
  Administered 2013-09-27: 50 mg via INTRAVENOUS

## 2013-09-27 MED ORDER — PROPOFOL 10 MG/ML IV BOLUS
INTRAVENOUS | Status: DC | PRN
  Administered 2013-09-27: 50 mg via INTRAVENOUS
  Administered 2013-09-27: 130 mg via INTRAVENOUS
  Administered 2013-09-27: 40 mg via INTRAVENOUS
  Administered 2013-09-27: 20 mg via INTRAVENOUS

## 2013-09-27 MED ORDER — AMLODIPINE BESYLATE 5 MG PO TABS
5.0000 mg | ORAL_TABLET | Freq: Every day | ORAL | Status: DC
  Administered 2013-09-27: 5 mg via ORAL
  Filled 2013-09-27 (×2): qty 1

## 2013-09-27 MED ORDER — ONDANSETRON HCL 4 MG/2ML IJ SOLN
INTRAMUSCULAR | Status: AC
Start: 2013-09-27 — End: 2013-09-27
  Filled 2013-09-27: qty 2

## 2013-09-27 MED ORDER — TRIAMTERENE-HCTZ 37.5-25 MG PO TABS
1.0000 | ORAL_TABLET | Freq: Every day | ORAL | Status: DC
  Administered 2013-09-27: 1 via ORAL
  Filled 2013-09-27 (×2): qty 1

## 2013-09-27 MED ORDER — HYDROMORPHONE HCL PF 1 MG/ML IJ SOLN
0.2500 mg | INTRAMUSCULAR | Status: DC | PRN
  Administered 2013-09-27 (×4): 0.5 mg via INTRAVENOUS

## 2013-09-27 MED ORDER — PROMETHAZINE HCL 25 MG/ML IJ SOLN: 6.2500 mg | INTRAMUSCULAR | Status: DC | PRN

## 2013-09-27 MED ORDER — VITAMIN B-12 1000 MCG PO TABS
1000.0000 ug | ORAL_TABLET | Freq: Every day | ORAL | Status: DC
  Administered 2013-09-27: 1000 ug via ORAL
  Filled 2013-09-27 (×2): qty 1

## 2013-09-27 MED ORDER — 0.9 % SODIUM CHLORIDE (POUR BTL) OPTIME
TOPICAL | Status: DC | PRN
  Administered 2013-09-27: 1000 mL

## 2013-09-27 MED ORDER — EPHEDRINE SULFATE 50 MG/ML IJ SOLN
INTRAMUSCULAR | Status: DC | PRN
  Administered 2013-09-27: 20 mg via INTRAVENOUS
  Administered 2013-09-27: 15 mg via INTRAVENOUS

## 2013-09-27 MED ORDER — FENTANYL CITRATE 0.05 MG/ML IJ SOLN
INTRAMUSCULAR | Status: AC
  Filled 2013-09-27: qty 5

## 2013-09-27 MED ORDER — DEXTROSE-NACL 5-0.45 % IV SOLN
INTRAVENOUS | Status: DC
  Administered 2013-09-27 – 2013-09-28 (×2): via INTRAVENOUS

## 2013-09-27 MED ORDER — HYDROCODONE-ACETAMINOPHEN 5-325 MG PO TABS
1.0000 | ORAL_TABLET | ORAL | Status: DC | PRN
  Administered 2013-09-27 – 2013-09-28 (×2): 2 via ORAL
  Filled 2013-09-27: qty 2

## 2013-09-27 MED ORDER — CLINDAMYCIN PHOSPHATE 600 MG/50ML IV SOLN: 600.0000 mg | Freq: Four times a day (QID) | INTRAVENOUS | Status: DC

## 2013-09-27 MED ORDER — PROPOFOL 10 MG/ML IV BOLUS
INTRAVENOUS | Status: AC
  Filled 2013-09-27: qty 20

## 2013-09-27 MED ORDER — HYDROMORPHONE HCL PF 1 MG/ML IJ SOLN
INTRAMUSCULAR | Status: AC
  Administered 2013-09-27: 0.5 mg via INTRAVENOUS
  Filled 2013-09-27: qty 1

## 2013-09-27 MED ORDER — ONDANSETRON HCL 4 MG/2ML IJ SOLN
INTRAMUSCULAR | Status: DC | PRN
  Administered 2013-09-27: 4 mg via INTRAVENOUS

## 2013-09-27 MED ORDER — SIMVASTATIN 20 MG PO TABS
20.0000 mg | ORAL_TABLET | Freq: Every day | ORAL | Status: DC
  Administered 2013-09-27: 20 mg via ORAL
  Filled 2013-09-27 (×2): qty 1

## 2013-09-27 MED ORDER — ONDANSETRON HCL 4 MG/2ML IJ SOLN: 4.0000 mg | Freq: Four times a day (QID) | INTRAMUSCULAR | Status: DC | PRN

## 2013-09-27 MED ORDER — OXYCODONE HCL 5 MG PO TABS: 5.0000 mg | ORAL_TABLET | Freq: Once | ORAL | Status: DC | PRN

## 2013-09-27 MED ORDER — HYDROCODONE-ACETAMINOPHEN 5-325 MG PO TABS
ORAL_TABLET | ORAL | Status: AC
  Administered 2013-09-27: 2 via ORAL
  Filled 2013-09-27: qty 2

## 2013-09-27 MED ORDER — CLINDAMYCIN PHOSPHATE 600 MG/50ML IV SOLN
INTRAVENOUS | Status: AC
  Administered 2013-09-27: 600 mg via INTRAVENOUS
  Filled 2013-09-27: qty 50

## 2013-09-27 SURGICAL SUPPLY — 55 items
ATTRACTOMAT 16X20 MAGNETIC DRP (DRAPES) IMPLANT
BLADE 10 SAFETY STRL DISP (BLADE) ×1 IMPLANT
BLADE 15 SAFETY STRL DISP (BLADE) IMPLANT
CANISTER SUCTION 2500CC (MISCELLANEOUS) ×3 IMPLANT
CLEANER TIP ELECTROSURG 2X2 (MISCELLANEOUS) ×3 IMPLANT
CORDS BIPOLAR (ELECTRODE) ×2 IMPLANT
COVER SURGICAL LIGHT HANDLE (MISCELLANEOUS) ×3 IMPLANT
CRADLE DONUT ADULT HEAD (MISCELLANEOUS) ×2 IMPLANT
DRAIN CHANNEL 15F RND FF W/TCR (WOUND CARE) IMPLANT
DRAIN HEMOVAC 1/8 X 5 (WOUND CARE) IMPLANT
DRAIN HEMOVAC 7FR (DRAIN) ×2 IMPLANT
DRAIN JACKSON PRATT 10MM FLAT (MISCELLANEOUS) IMPLANT
ELECT COATED BLADE 2.86 ST (ELECTRODE) ×3 IMPLANT
ELECT PAIRED SUBDERMAL (MISCELLANEOUS) ×3
ELECT REM PT RETURN 9FT ADLT (ELECTROSURGICAL) ×3
ELECTRODE PAIRED SUBDERMAL (MISCELLANEOUS) IMPLANT
ELECTRODE REM PT RTRN 9FT ADLT (ELECTROSURGICAL) ×1 IMPLANT
EVACUATOR SILICONE 100CC (DRAIN) ×3 IMPLANT
GAUZE SPONGE 4X4 16PLY XRAY LF (GAUZE/BANDAGES/DRESSINGS) ×2 IMPLANT
GLOVE BIO SURGEON STRL SZ 6.5 (GLOVE) ×1 IMPLANT
GLOVE BIO SURGEON STRL SZ8 (GLOVE) ×6 IMPLANT
GLOVE BIO SURGEONS STRL SZ 6.5 (GLOVE) ×1
GLOVE ECLIPSE 7.5 STRL STRAW (GLOVE) ×3 IMPLANT
GOWN STRL REUS W/ TWL LRG LVL3 (GOWN DISPOSABLE) ×2 IMPLANT
GOWN STRL REUS W/TWL LRG LVL3 (GOWN DISPOSABLE) ×9
KIT BASIN OR (CUSTOM PROCEDURE TRAY) ×3 IMPLANT
KIT ROOM TURNOVER OR (KITS) ×3 IMPLANT
LOCATOR NERVE 3 VOLT (DISPOSABLE) IMPLANT
NS IRRIG 1000ML POUR BTL (IV SOLUTION) ×3 IMPLANT
PAD ARMBOARD 7.5X6 YLW CONV (MISCELLANEOUS) ×6 IMPLANT
PENCIL FOOT CONTROL (ELECTRODE) ×3 IMPLANT
PROBE NERVBE PRASS .33 (MISCELLANEOUS) ×2 IMPLANT
SPONGE INTESTINAL PEANUT (DISPOSABLE) IMPLANT
SPONGE LAP 18X18 X RAY DECT (DISPOSABLE) ×3 IMPLANT
STAPLER VISISTAT 35W (STAPLE) ×3 IMPLANT
SUT CHROMIC 3 0 SH 27 (SUTURE) ×3 IMPLANT
SUT CHROMIC 4 0 PS 2 18 (SUTURE) ×7 IMPLANT
SUT CHROMIC 5 0 P 3 (SUTURE) IMPLANT
SUT ETHIBOND 3 0 SH (SUTURE) IMPLANT
SUT ETHILON 4 0 PS 2 18 (SUTURE) ×2 IMPLANT
SUT ETHILON 5 0 PS 2 18 (SUTURE) ×2 IMPLANT
SUT SILK 2 0 (SUTURE) ×3
SUT SILK 2 0 SH CR/8 (SUTURE) ×3 IMPLANT
SUT SILK 2-0 18XBRD TIE 12 (SUTURE) ×1 IMPLANT
SUT SILK 3 0 TIES 17X18 (SUTURE) ×3
SUT SILK 3-0 18XBRD TIE BLK (SUTURE) ×1 IMPLANT
SUT SILK 4 0 (SUTURE) ×3
SUT SILK 4-0 18XBRD TIE 12 (SUTURE) ×1 IMPLANT
SUT VIC AB 3-0 FS2 27 (SUTURE) IMPLANT
SUT VIC AB 3-0 SH 18 (SUTURE) IMPLANT
TOWEL OR 17X26 10 PK STRL BLUE (TOWEL DISPOSABLE) ×3 IMPLANT
TRAY ENT MC OR (CUSTOM PROCEDURE TRAY) ×3 IMPLANT
TRAY FOLEY CATH 14FR (SET/KITS/TRAYS/PACK) ×2 IMPLANT
TRAY FOLEY CATH 16FR SILVER (SET/KITS/TRAYS/PACK) ×1 IMPLANT
TUBE FEEDING 10FR FLEXIFLO (MISCELLANEOUS) IMPLANT

## 2013-09-27 NOTE — H&P (Signed)
Desiree Floyd is an 80 y.o. female.   Chief Complaint: left neck mass HPI: Hx of left sunmandibular mass that is suspicious for SCCA. Here for neck dissection  Past Medical History  Diagnosis Date  . Hypertension   . Hyperlipidemia   . Diabetes mellitus 2012    diet controlled  . CAD (coronary artery disease) 2004    s/p stent [Jordan]  . Ex-smoker   . OA (osteoarthritis)     of hands  . CPPD (pseudo-gout)   . COPD (chronic obstructive pulmonary disease)     not on meds, FEV1 39%, FVC 49%, ratio 0.59, not reversible w SABA (05/2008)  . Asthma   . Allergy   . Urge incontinence 2008    s/p urodynamics [Dahlstedt]  . Benign breast lumps     knows breast lumps, told scar tissue from implants  . Gout     Deveshwar/Landau  . PAC (premature atrial contraction)     chronic  . DJD (degenerative joint disease) of lumbar spine   . Myocardial infarction  2004    "right before I had stent put in" (02/02/2013)  . Exertional shortness of breath   . BCC (basal cell carcinoma of skin) 2014    L lower lip s/p excision  . Mass of left submandibular region 2015    s/p aspiration by ENT    Past Surgical History  Procedure Laterality Date  . Tonsillectomy    . Cataract extraction, bilateral  2010  . Inner ear surgery Bilateral     "had one surgery on each ear for hearing loss:  prosthesis implanted in right ear; metal piece placed in left ear"  . Augmentation mammaplasty Bilateral 1970's  . Coronary angioplasty with stent placement  2004    "1"   . Colonoscopy      Family History  Problem Relation Age of Onset  . Heart disease Father 33    CAD AND HEART ATTACK  . Other Mother     old age, age 26  . Heart disease Mother    Social History:  reports that she has been smoking.  She has never used smokeless tobacco. She reports that she drinks alcohol. She reports that she does not use illicit drugs.  Allergies:  Allergies  Allergen Reactions  . Augmentin [Amoxicillin-Pot  Clavulanate] Other (See Comments)    Diarrhea and vomiting  . Lisinopril Cough    Medications Prior to Admission  Medication Sig Dispense Refill  . acetaminophen (TYLENOL) 500 MG tablet Take 1,000 mg by mouth every 6 (six) hours as needed for moderate pain.      Marland Kitchen albuterol (PROVENTIL HFA;VENTOLIN HFA) 108 (90 BASE) MCG/ACT inhaler Inhale 2 puffs into the lungs every 6 (six) hours as needed for wheezing or shortness of breath.  1 Inhaler  3  . allopurinol (ZYLOPRIM) 300 MG tablet Take 300 mg by mouth daily.      Marland Kitchen amLODipine (NORVASC) 5 MG tablet Take 5 mg by mouth daily.      Marland Kitchen aspirin 81 MG tablet Take 81 mg by mouth daily.        . budesonide-formoterol (SYMBICORT) 160-4.5 MCG/ACT inhaler Inhale 2 puffs into the lungs daily.      Marland Kitchen glucose blood (ONE TOUCH TEST STRIPS) test strip 1 each by Other route See admin instructions. Check blood sugar once daily.      . metoprolol tartrate (LOPRESSOR) 25 MG tablet Take 12.5 mg by mouth 2 (two) times daily.      Marland Kitchen  Omega-3 Fatty Acids (FISH OIL) 1000 MG CAPS Take 4,000 mg by mouth daily.       . simvastatin (ZOCOR) 20 MG tablet Take 20 mg by mouth daily.      Marland Kitchen triamterene-hydrochlorothiazide (MAXZIDE-25) 37.5-25 MG per tablet Take 1 tablet by mouth daily.      . vitamin B-12 (CYANOCOBALAMIN) 1000 MCG tablet Take 1,000 mcg by mouth daily.        Results for orders placed during the hospital encounter of 09/27/13 (from the past 48 hour(s))  GLUCOSE, CAPILLARY     Status: Abnormal   Collection Time    09/27/13  8:14 AM      Result Value Ref Range   Glucose-Capillary 133 (*) 70 - 99 mg/dL   No results found.  Review of Systems  Constitutional: Negative.   HENT: Negative.   Eyes: Negative.   Respiratory: Negative.   Cardiovascular: Negative.   Skin: Negative.     Blood pressure 150/56, pulse 78, temperature 98.2 F (36.8 C), temperature source Oral, resp. rate 18, height 5\' 2"  (1.575 m), weight 58.741 kg (129 lb 8 oz), SpO2  96.00%. Physical Exam  Constitutional: She appears well-developed and well-nourished.  HENT:  Head: Normocephalic.  Nose: Nose normal.  Mouth/Throat: Oropharynx is clear and moist.  Eyes: Conjunctivae are normal. Pupils are equal, round, and reactive to light.  Neck: Normal range of motion. Neck supple.  Cardiovascular: Normal rate.   Respiratory: Effort normal.  GI: Soft.  Musculoskeletal: Normal range of motion.  Lymphadenopathy:    She has cervical adenopathy.     Assessment/Plan Left neck mass- discussed surgery and she is ready to proceed  Melissa Montane 09/27/2013, 9:10 AM

## 2013-09-27 NOTE — Transfer of Care (Signed)
Immediate Anesthesia Transfer of Care Note  Patient: Desiree Floyd  Procedure(s) Performed: Procedure(s): LEFT SUPRAHYOID DISSECTION (Left)  Patient Location: PACU  Anesthesia Type:General  Level of Consciousness: awake  Airway & Oxygen Therapy: Patient Spontanous Breathing and Patient connected to nasal cannula oxygen  Post-op Assessment: Report given to PACU RN, Post -op Vital signs reviewed and stable and Patient moving all extremities  Post vital signs: Reviewed and stable  Complications: No apparent anesthesia complications

## 2013-09-27 NOTE — Anesthesia Postprocedure Evaluation (Signed)
Anesthesia Post Note  Patient: Desiree Floyd  Procedure(s) Performed: Procedure(s) (LRB): LEFT SUPRAHYOID DISSECTION (Left)  Anesthesia type: general  Patient location: PACU  Post pain: Pain level controlled  Post assessment: Patient's Cardiovascular Status Stable  Last Vitals:  Filed Vitals:   09/27/13 1415  BP: 107/52  Pulse: 71  Temp:   Resp: 12    Post vital signs: Reviewed and stable  Level of consciousness: sedated  Complications: No apparent anesthesia complications

## 2013-09-27 NOTE — Op Note (Signed)
Preop/postop diagnoses: Left submandibular mass Procedure: Left suprahyoid neck dissection with facial nerve monitor Anesthesia: Gen. Estimated blood loss: Less than 25 cc Indications: 80 year old who's had problems with a mass in the left submandibular area that initially was infected. It was treated and has reduced in size. 2 needle biopsies have shown that there is a suspicious squamous cell atypia consistent with squamous cell carcinoma. She has no evidence of a primary within her oropharynx oral cavity but does have a recent history of a lower lip cancer. She was informed risks and benefits of the procedure and understands that the marginal mandibular nerve may the very close to this tumor and may need to be sacrificed. Options were discussed. All questions are answered and consent was obtained. Operation: Patient was taken to the operating room placed in the supine position after general endotracheal tube anesthesia was positioned with the facial nerve monitor in the lower lip region with good function and prepped and draped in the usual sterile manner. An incision was made in a skin crease in the left submandibular region and a subplatysmal flap was elevated superiorly. The marginal mandibular nerve was then dissected area it did dissected from the angle up to above the mandible rim and was away from the tumor which was very close to the muscle. In fact with the platysmal dissection the platysma was left overlying this firm mass. The marginal mandibular nerve was checked with the stimulator and it seemed to function with it dissected. An  incision was then made along the edge of the mandible rim and the vessels were clamped and divided. The mass was dissected along the posterior and anterior belly of digastric. The posterior artery was clamped and divided. The mylohyoid was retracted and the lingual nerve was identified. The ganglion was clamped and divided. The tumor was then retracted inferiorly and  the hypoglossal nerve was identified. The duct was clamped and divided. The tumor was then removed along with a small piece of the mylohyoid muscle that was with some scarring. The wound was irrigated. He was then closed with interrupted 4-0 chromic with a #7 JP drain placed. Secured with 3-0 silk. The skin was closed with a running 5-0 nylon. The patient was then awakened brought to recovery room in stable condition counts correct the

## 2013-09-27 NOTE — Anesthesia Preprocedure Evaluation (Addendum)
Anesthesia Evaluation  Patient identified by MRN, date of birth, ID band Patient awake    Reviewed: Allergy & Precautions, H&P , NPO status , Patient's Chart, lab work & pertinent test results  History of Anesthesia Complications Negative for: history of anesthetic complications  Airway Mallampati: II TM Distance: >3 FB Neck ROM: Full    Dental  (+) Teeth Intact, Dental Advisory Given   Pulmonary asthma , COPD COPD inhaler, Current Smoker,    Pulmonary exam normal       Cardiovascular hypertension, Pt. on medications and Pt. on home beta blockers + CAD, + Past MI and + Peripheral Vascular Disease  Nuclear stress test on 06/30/11 showed: Overall Impression: Normal stress nuclear study. LV Ejection Fraction: 90%. LV Wall Motion: NL LV Function; NL Wall Motion.    Neuro/Psych negative neurological ROS  negative psych ROS   GI/Hepatic negative GI ROS, Neg liver ROS,   Endo/Other  diabetes  Renal/GU negative Renal ROS     Musculoskeletal   Abdominal   Peds  Hematology   Anesthesia Other Findings   Reproductive/Obstetrics                          Anesthesia Physical Anesthesia Plan  ASA: III  Anesthesia Plan: General   Post-op Pain Management:    Induction: Intravenous  Airway Management Planned: Oral ETT  Additional Equipment:   Intra-op Plan:   Post-operative Plan: Extubation in OR  Informed Consent: I have reviewed the patients History and Physical, chart, labs and discussed the procedure including the risks, benefits and alternatives for the proposed anesthesia with the patient or authorized representative who has indicated his/her understanding and acceptance.   Dental advisory given  Plan Discussed with: CRNA, Anesthesiologist and Surgeon  Anesthesia Plan Comments:         Anesthesia Quick Evaluation

## 2013-09-27 NOTE — Progress Notes (Signed)
Report given to Jamie RN

## 2013-09-27 NOTE — Anesthesia Procedure Notes (Signed)
Procedure Name: Intubation Date/Time: 09/27/2013 9:50 AM Performed by: Melina Copa, Yashira Offenberger R Pre-anesthesia Checklist: Patient identified, Emergency Drugs available, Suction available, Patient being monitored and Timeout performed Patient Re-evaluated:Patient Re-evaluated prior to inductionOxygen Delivery Method: Circle system utilized Preoxygenation: Pre-oxygenation with 100% oxygen Intubation Type: IV induction Ventilation: Mask ventilation without difficulty Laryngoscope Size: Mac and 3 Grade View: Grade II Tube type: Oral Tube size: 7.5 mm Number of attempts: 1 Airway Equipment and Method: Stylet Placement Confirmation: ETT inserted through vocal cords under direct vision,  positive ETCO2 and breath sounds checked- equal and bilateral Secured at: 21 cm Tube secured with: Tape Dental Injury: Teeth and Oropharynx as per pre-operative assessment

## 2013-09-27 NOTE — Progress Notes (Signed)
Patient ID: Desiree Floyd, female   DOB: [birth year], 80 y.o.   MRN: [id number] Doing well without specific complaint. AF VSS Alert. NAD. Left neck incision clean and intact with no fluid collection.  Drain functioning. Left lower lip mildly weak but moving. A/P: s/p left neck dissection Doing well.  Continue drain and observation overnight.

## 2013-09-28 DIAGNOSIS — C50919 Malignant neoplasm of unspecified site of unspecified female breast: Secondary | ICD-10-CM | POA: Diagnosis not present

## 2013-09-28 MED ORDER — HYDROCODONE-ACETAMINOPHEN 5-325 MG PO TABS: 1.0000 | ORAL_TABLET | Freq: Four times a day (QID) | ORAL | Status: DC | PRN

## 2013-09-28 NOTE — Progress Notes (Signed)
1 Day Post-Op  Subjective: Doing well. Pain controlled.   Objective: Vital signs in last 24 hours: Temp:  [97.4 F (36.3 C)-98.4 F (36.9 C)] 97.8 F (36.6 C) (08/21 0915) Pulse Rate:  [59-78] 70 (08/21 0915) Resp:  [12-18] 16 (08/21 0915) BP: (98-128)/(46-64) 106/64 mmHg (08/21 0915) SpO2:  [91 %-98 %] 93 % (08/21 0915) Weight:  [58.333 kg (128 lb 9.6 oz)] 58.333 kg (128 lb 9.6 oz) (08/20 1601) Last BM Date: 09/25/13  Intake/Output from previous day: 08/20 0701 - 08/21 0700 In: 2962.9 [P.O.:240; I.V.:2722.9] Out: 3748 [Urine:2400; Drains:55] Intake/Output this shift: Total I/O In: 240 [P.O.:240] Out: -   awake and alert. no excessive swelling or evidence of infection. VII nerve weak at lip on left. drain removed without problem.   Lab Results:   Recent Labs  09/27/13 1710  WBC 7.8  HGB 12.1  HCT 37.5  PLT 169   BMET  Recent Labs  09/27/13 1710  CREATININE 0.77   PT/INR No results found for this basename: LABPROT, INR,  in the last 72 hours ABG No results found for this basename: PHART, PCO2, PO2, HCO3,  in the last 72 hours  Studies/Results: No results found.  Anti-infectives: Anti-infectives   Start     Dose/Rate Route Frequency Ordered Stop   09/27/13 1800  clindamycin (CLEOCIN) IVPB 600 mg  Status:  Discontinued     600 mg 100 mL/hr over 30 Minutes Intravenous 4 times per day 09/27/13 1603 09/27/13 1644   09/27/13 1700  clindamycin (CLEOCIN) IVPB 600 mg     600 mg 100 mL/hr over 30 Minutes Intravenous Every 6 hours 09/27/13 1645 09/28/13 0554   09/27/13 0930  clindamycin (CLEOCIN) 600 MG/50ML IVPB    Comments:  Claybon Jabs   : cabinet override      09/27/13 0930 09/27/13 2707      Assessment/Plan: s/p Procedure(s): LEFT SUPRAHYOID DISSECTION (Left) d/c to home. f/u in 1 week The left marginal nerve is weak as expected.  LOS: 1 day    Melissa Montane 09/28/2013

## 2013-09-28 NOTE — Discharge Summary (Signed)
Physician Discharge Summary  Patient ID: Desiree Floyd MRN: [id number] DOB/AGE: 78-19-1934 78 y.o.  Admit date: 09/27/2013 Discharge date: 09/28/2013  Admission Diagnoses: Left neck mass  Discharge Diagnoses: Same  Active Problems:   Mass of left side of neck   Discharged Condition: good  Hospital Course: Patient was admitted to the floor after her left suprahyoid neck dissection. She did well. She was taking fluids well. Pain was controlled. No evidence of infection of the wound. Her marginal mandibular nerve on the left side was weak as expected. She was discharged to home in stable condition with no tenderness of her extremities and no complaints. Followup in one week for suture removal sooner if there's any evidence of swelling, redness, or increasing pain.  Consults: None  Significant Diagnostic Studies: None  Treatments: surgery: Left neck dissection  Discharge Exam: Blood pressure 106/64, pulse 70, temperature 97.8 F (36.6 C), temperature source Oral, resp. rate 16, height 5\' 2"  (1.575 m), weight 58.333 kg (128 lb 9.6 oz), SpO2 93.00%. Awake and alert. Nose is clear. Oral cavity/oropharynx there is no lesions. Neck wound looks excellent with no evidence of hematoma or infection. Drain was removed without difficulty. There was no mass.l- clear cv- reg extremities no tenderness or swelling  Disposition: 01-Home or Self Care  Discharge Instructions   Call MD for:  difficulty breathing, headache or visual disturbances    Complete by:  As directed      Call MD for:  extreme fatigue    Complete by:  As directed      Call MD for:  hives    Complete by:  As directed      Call MD for:  persistant dizziness or light-headedness    Complete by:  As directed      Call MD for:  persistant nausea and vomiting    Complete by:  As directed      Call MD for:  redness, tenderness, or signs of infection (pain, swelling, redness, odor or green/yellow discharge around incision site)     Complete by:  As directed      Call MD for:  severe uncontrolled pain    Complete by:  As directed      Call MD for:  temperature >100.4    Complete by:  As directed      Diet - low sodium heart healthy    Complete by:  As directed      Discharge instructions    Complete by:  As directed   Call if  any questions. Can get wound lightly wet in 24 hours. Follow up in 1 week call office for appointment.     Increase activity slowly    Complete by:  As directed             Medication List         acetaminophen 500 MG tablet  Commonly known as:  TYLENOL  Take 1,000 mg by mouth every 6 (six) hours as needed for moderate pain.     albuterol 108 (90 BASE) MCG/ACT inhaler  Commonly known as:  PROVENTIL HFA;VENTOLIN HFA  Inhale 2 puffs into the lungs every 6 (six) hours as needed for wheezing or shortness of breath.     allopurinol 300 MG tablet  Commonly known as:  ZYLOPRIM  Take 300 mg by mouth daily.     amLODipine 5 MG tablet  Commonly known as:  NORVASC  Take 5 mg by mouth daily.     aspirin  81 MG tablet  Take 81 mg by mouth daily.     budesonide-formoterol 160-4.5 MCG/ACT inhaler  Commonly known as:  SYMBICORT  Inhale 2 puffs into the lungs daily.     Fish Oil 1000 MG Caps  Take 4,000 mg by mouth daily.     HYDROcodone-acetaminophen 5-325 MG per tablet  Commonly known as:  LORTAB  Take 1 tablet by mouth every 6 (six) hours as needed for moderate pain.     metoprolol tartrate 25 MG tablet  Commonly known as:  LOPRESSOR  Take 12.5 mg by mouth 2 (two) times daily.     ONE TOUCH TEST STRIPS test strip  Generic drug:  glucose blood  1 each by Other route See admin instructions. Check blood sugar once daily.     simvastatin 20 MG tablet  Commonly known as:  ZOCOR  Take 20 mg by mouth daily.     triamterene-hydrochlorothiazide 37.5-25 MG per tablet  Commonly known as:  MAXZIDE-25  Take 1 tablet by mouth daily.     vitamin B-12 1000 MCG tablet  Commonly known as:   CYANOCOBALAMIN  Take 1,000 mcg by mouth daily.         SignedMelissa Montane 09/28/2013, 10:29 AM

## 2013-09-30 ENCOUNTER — Encounter: Payer: Self-pay | Admitting: Family Medicine

## 2013-10-01 ENCOUNTER — Encounter (HOSPITAL_COMMUNITY): Payer: Self-pay | Admitting: Otolaryngology

## 2013-10-09 ENCOUNTER — Ambulatory Visit (INDEPENDENT_AMBULATORY_CARE_PROVIDER_SITE_OTHER): Payer: Medicare Other | Admitting: Cardiology

## 2013-10-09 ENCOUNTER — Encounter: Payer: Self-pay | Admitting: Cardiology

## 2013-10-09 VITALS — BP 134/62 | HR 66 | Ht 62.0 in | Wt 129.0 lb

## 2013-10-09 DIAGNOSIS — E119 Type 2 diabetes mellitus without complications: Secondary | ICD-10-CM

## 2013-10-09 DIAGNOSIS — I739 Peripheral vascular disease, unspecified: Secondary | ICD-10-CM

## 2013-10-09 DIAGNOSIS — I2510 Atherosclerotic heart disease of native coronary artery without angina pectoris: Secondary | ICD-10-CM

## 2013-10-09 DIAGNOSIS — I10 Essential (primary) hypertension: Secondary | ICD-10-CM

## 2013-10-09 NOTE — Patient Instructions (Signed)
Continue your current therapy  You can reduce fish oil to one tablet.  You need to follow up with Dr. Danise Mina to check your sugar and lipids.  Quit smoking.   I will see you in one year.

## 2013-10-10 NOTE — Progress Notes (Signed)
Desiree Floyd Date of Birth: 05-08-1932   History of Present Illness: Desiree Floyd is seen today for followup visit. She has a history of coronary disease with a remote lateral myocardial infarction in 2004 treated with stenting of the left circumflex coronary with a drug-eluting stent. Myoview study in May 2013 was normal. She also has a history of dyslipidemia, diabetes mellitus, hypertension, and tobacco use. She continues to smoke one pack per day. She quit for a while but was gaining weight and resumed. She denies any chest pain or shortness of breath. She does have evidence of peripheral arterial disease with ABIs of 0.96 on the left and 0.81 on the right. She has mild claudication symptoms walking uphill. More recently she underwent left radical neck dissection by Dr. [name] for [facility] of the neck. RT and chemo not recommended.  Current Outpatient Prescriptions on File Prior to Visit  Medication Sig Dispense Refill  . acetaminophen (TYLENOL) 500 MG tablet Take 1,000 mg by mouth every 6 (six) hours as needed for moderate pain.      Marland Kitchen albuterol (PROVENTIL HFA;VENTOLIN HFA) 108 (90 BASE) MCG/ACT inhaler Inhale 2 puffs into the lungs every 6 (six) hours as needed for wheezing or shortness of breath.  1 Inhaler  3  . allopurinol (ZYLOPRIM) 300 MG tablet Take 300 mg by mouth daily.      Marland Kitchen amLODipine (NORVASC) 5 MG tablet Take 5 mg by mouth daily.      Marland Kitchen aspirin 81 MG tablet Take 81 mg by mouth daily.        . budesonide-formoterol (SYMBICORT) 160-4.5 MCG/ACT inhaler Inhale 2 puffs into the lungs daily.      Marland Kitchen glucose blood (ONE TOUCH TEST STRIPS) test strip 1 each by Other route See admin instructions. Check blood sugar once daily.      Marland Kitchen HYDROcodone-acetaminophen (LORTAB) 5-325 MG per tablet Take 1 tablet by mouth every 6 (six) hours as needed for moderate pain.  30 tablet  0  . metoprolol tartrate (LOPRESSOR) 25 MG tablet Take 12.5 mg by mouth 2 (two) times daily.      . Omega-3 Fatty Acids  (FISH OIL) 1000 MG CAPS Take 4,000 mg by mouth daily.       . simvastatin (ZOCOR) 20 MG tablet Take 20 mg by mouth daily.      Marland Kitchen triamterene-hydrochlorothiazide (MAXZIDE-25) 37.5-25 MG per tablet Take 1 tablet by mouth daily.      . vitamin B-12 (CYANOCOBALAMIN) 1000 MCG tablet Take 1,000 mcg by mouth daily.       No current facility-administered medications on file prior to visit.    Allergies  Allergen Reactions  . Augmentin [Amoxicillin-Pot Clavulanate] Other (See Comments)    Diarrhea and vomiting  . Lisinopril Cough    Past Medical History  Diagnosis Date  . Hypertension   . Hyperlipidemia   . CAD (coronary artery disease) 2004    s/p stent [Sharonna Vinje]  . Ex-smoker   . OA (osteoarthritis)     of hands  . CPPD (pseudo-gout)   . COPD (chronic obstructive pulmonary disease)     not on meds, FEV1 39%, FVC 49%, ratio 0.59, not reversible w SABA (05/2008)  . Asthma   . Allergy   . Urge incontinence 2008    s/p urodynamics [Dahlstedt]  . Benign breast lumps     knows breast lumps, told scar tissue from implants  . Gout     Deveshwar/Landau  . PAC (premature atrial contraction)  chronic  . DJD (degenerative joint disease) of lumbar spine   . Exertional shortness of breath   . Mass of left submandibular region 2015    s/p aspiration then excision by ENT  . Myocardial infarction  2004    "right before I had stent put in"   . Diabetes mellitus since 2012    diet controlled  . BCC (basal cell carcinoma of skin) 2014    L lower lip s/p excision    Past Surgical History  Procedure Laterality Date  . Cataract extraction w/ intraocular lens  implant, bilateral Bilateral 2010  . Inner ear surgery Bilateral     "had one surgery on each ear for hearing loss:  prosthesis implanted in right ear; metal piece placed in left ear"  . Coronary angioplasty with stent placement  2004    "1"   . Colonoscopy    . Suprahyoid dissection Left 09/27/2013  . Tonsillectomy  1950's  .  Augmentation mammaplasty Bilateral 1970's  . Basal cell carcinoma excision Left 2014    "lower lip"  . Neck mass excision Left 09/2013    LN with metastatic SCC unknown primary  . Radical neck dissection Left 09/27/2013    Procedure: LEFT SUPRAHYOID DISSECTION;  Surgeon: Melissa Montane, MD;  Location: [facility];  Service: ENT;  Laterality: Left;    History  Smoking status  . Current Every Day Smoker -- 0.50 packs/day for 58 years  . Types: Cigarettes  Smokeless tobacco  . Never Used    History  Alcohol Use  . Yes    Comment: 09/27/2013  "maybe a glass of wine twice/yr"    Family History  Problem Relation Age of Onset  . Heart disease Father 97    CAD AND HEART ATTACK  . Other Mother     old age, age 28  . Heart disease Mother     Review of Systems: As noted in history of present illness. All other systems were reviewed and are negative.  Physical Exam: BP 134/62  Pulse 66  Ht 5\' 2"  (1.575 m)  Wt 129 lb (58.514 kg)  BMI 23.59 kg/m2 She is an elderly white female in no acute distress.The patient is alert and oriented x 3.    The skin is warm and dry.  Color is normal.  The HEENT exam is normal.  The carotids are 2+ without bruits.  There is no thyromegaly.  There is no JVD.  Surgical scar is healing well. The lungs are clear.  The heart exam reveals a regular rate with a normal S1 and S2.  There are no murmurs, gallops, or rubs.  The PMI is not displaced.   Abdominal exam reveals good bowel sounds.  There is no guarding or rebound.  There is no hepatosplenomegaly or tenderness.  There are no masses.  Exam of the legs reveal no clubbing, cyanosis, or edema.  The distal pulses are intact.  Cranial nerves II - XII are intact.  Motor and sensory functions are intact.  The gait is normal.  LABORATORY DATA: Lab Results  Component Value Date   WBC 7.8 09/27/2013   HGB 12.1 09/27/2013   HCT 37.5 09/27/2013   PLT 169 09/27/2013   GLUCOSE 134* 09/20/2013   CHOL 127 02/24/2012   TRIG 121.0  02/24/2012   HDL 43.10 02/24/2012   LDLCALC 60 02/24/2012   ALT 23 02/01/2013   AST 28 02/01/2013   NA 142 09/20/2013   K 4.8 09/20/2013   CL  102 09/20/2013   CREATININE 0.77 09/27/2013   BUN 13 09/20/2013   CO2 26 09/20/2013   TSH 1.57 03/16/2011   HGBA1C 6.2* 02/01/2013   MICROALBUR 0.7 02/24/2012     Assessment / Plan: 1. Coronary disease with remote stenting of the left circumflex coronary in 2004. She remains asymptomatic. Myoview study one year ago was normal. We will continue therapy with aspirin, metoprolol, and amlodipine.  2. Tobacco dependence. Stressed the importance of smoking cessation especially in light of recent dx of SSCA of the neck.  3. Peripheral arterial disease. Encouraged a regular walking program.  4. Diabetes mellitus type 2.  5. Hyperlipidemia well controlled on simvastatin.  6. Hypertension-controlled.  Patient is due for follow up fasting lab work. Needs to follow up with primary MD to check sugars and lipids. OK to reduce fish oil to one gram per day.

## 2013-11-30 ENCOUNTER — Other Ambulatory Visit: Payer: Self-pay | Admitting: Family Medicine

## 2013-12-10 ENCOUNTER — Other Ambulatory Visit: Payer: Self-pay

## 2013-12-10 DIAGNOSIS — Z1231 Encounter for screening mammogram for malignant neoplasm of breast: Secondary | ICD-10-CM

## 2013-12-13 ENCOUNTER — Ambulatory Visit (INDEPENDENT_AMBULATORY_CARE_PROVIDER_SITE_OTHER): Payer: Medicare Other | Admitting: Family Medicine

## 2013-12-13 ENCOUNTER — Encounter: Payer: Self-pay | Admitting: Family Medicine

## 2013-12-13 VITALS — BP 128/62 | HR 74 | Temp 98.0°F | Resp 14 | Wt 126.2 lb

## 2013-12-13 DIAGNOSIS — J449 Chronic obstructive pulmonary disease, unspecified: Secondary | ICD-10-CM

## 2013-12-13 DIAGNOSIS — Z9889 Other specified postprocedural states: Secondary | ICD-10-CM

## 2013-12-13 DIAGNOSIS — Z720 Tobacco use: Secondary | ICD-10-CM

## 2013-12-13 DIAGNOSIS — F17200 Nicotine dependence, unspecified, uncomplicated: Secondary | ICD-10-CM

## 2013-12-13 DIAGNOSIS — E119 Type 2 diabetes mellitus without complications: Secondary | ICD-10-CM

## 2013-12-13 DIAGNOSIS — E785 Hyperlipidemia, unspecified: Secondary | ICD-10-CM

## 2013-12-13 DIAGNOSIS — I10 Essential (primary) hypertension: Secondary | ICD-10-CM

## 2013-12-13 DIAGNOSIS — Z859 Personal history of malignant neoplasm, unspecified: Secondary | ICD-10-CM

## 2013-12-13 LAB — HEMOGLOBIN A1C: HEMOGLOBIN A1C: 6.5 % (ref 4.6–6.5)

## 2013-12-13 LAB — LDL CHOLESTEROL, DIRECT: Direct LDL: 54.6 mg/dL

## 2013-12-13 MED ORDER — TRIAMTERENE-HCTZ 37.5-25 MG PO TABS: 1.0000 | ORAL_TABLET | Freq: Every day | ORAL | Status: DC

## 2013-12-13 MED ORDER — FLUTICASONE-SALMETEROL 250-50 MCG/DOSE IN AEPB: 1.0000 | INHALATION_SPRAY | Freq: Two times a day (BID) | RESPIRATORY_TRACT | Status: DC

## 2013-12-13 NOTE — Assessment & Plan Note (Signed)
Chronic, stable. Continue maxzide, amlodipine, metoprolol. maxzide refilled.

## 2013-12-13 NOTE — Assessment & Plan Note (Signed)
Previously diet controlled diabetes, recently very well controlled. Anticipate prediabetes - check A1c today.

## 2013-12-13 NOTE — Patient Instructions (Addendum)
Since symbicort is too expensive price out advair. If too expensive as well, may take spiriva until the end of the year. Check A1c today (labwork) We will check cholesterol next visit when fasting. Good to see you today, call us with questions. Next visit will be medicare wellness - return in 3-4 months for this.

## 2013-12-13 NOTE — Progress Notes (Signed)
BP 128/62 mmHg  Pulse 74  Temp(Src) 98 F (36.7 C) (Oral)  Resp 14  Wt 126 lb 4 oz (57.267 kg)  SpO2 96%   CC: med refill visit  Subjective:    Patient ID: Desiree Floyd, female    DOB: [birth year], 80 y.o.   MRN: [id number]  HPI: Desiree Floyd is a 80 y.o. female presenting on 12/13/2013 for Medication Refill   Recent radical neck dissection for squamous cell cancer of neck. RT/chemo not recommended. Has f/u scheduled with ENG 01/13/2014.  COPD - has had difficulty quitting smoking . Compliant with symbicort twice daily. Not using spiriva daily.   Smoking - 1/2 ppd. Has failed previous attempts to quit.  HTN - Compliant with current antihypertensive regimen of amlodipine 5mg  daily, metoprolol 12.5mg  bid, and maxzide 37.5/25 mg daily.  Does check blood pressures at home intermittently.  No low blood pressure readings or symptoms of dizziness/syncope.  Denies HA, vision changes, CP/tightness, SOB, leg swelling.    Prediabetes - pt checks sugars a few times every few weeks. Diet controlled. Lab Results  Component Value Date   HGBA1C 6.2* 02/01/2013    Relevant past medical, surgical, family and social history reviewed and updated as indicated.  Allergies and medications reviewed and updated. Current Outpatient Prescriptions on File Prior to Visit  Medication Sig  . acetaminophen (TYLENOL) 500 MG tablet Take 1,000 mg by mouth every 6 (six) hours as needed for moderate pain.  Marland Kitchen albuterol (PROVENTIL HFA;VENTOLIN HFA) 108 (90 BASE) MCG/ACT inhaler Inhale 2 puffs into the lungs every 6 (six) hours as needed for wheezing or shortness of breath.  . allopurinol (ZYLOPRIM) 300 MG tablet Take 1 tablet by mouth  daily  . amLODipine (NORVASC) 5 MG tablet Take 1 tablet by mouth  daily  . aspirin 81 MG tablet Take 81 mg by mouth daily.    . budesonide-formoterol (SYMBICORT) 160-4.5 MCG/ACT inhaler Inhale 2 puffs into the lungs daily.  Marland Kitchen glucose blood (ONE TOUCH TEST STRIPS) test strip  1 each by Other route See admin instructions. Check blood sugar once daily.  Marland Kitchen HYDROcodone-acetaminophen (LORTAB) 5-325 MG per tablet Take 1 tablet by mouth every 6 (six) hours as needed for moderate pain.  . metoprolol tartrate (LOPRESSOR) 25 MG tablet Take one-half tablet by  mouth in the morning and in the evening  . Omega-3 Fatty Acids (FISH OIL) 1000 MG CAPS Take 1,000 mg by mouth daily.   . simvastatin (ZOCOR) 20 MG tablet Take 1 tablet by mouth at  bedtime  . vitamin B-12 (CYANOCOBALAMIN) 1000 MCG tablet Take 1,000 mcg by mouth daily.   No current facility-administered medications on file prior to visit.   Past Medical History  Diagnosis Date  . Hypertension   . Hyperlipidemia   . CAD (coronary artery disease) 2004    s/p stent [Jordan]  . Ex-smoker   . OA (osteoarthritis)     of hands  . CPPD (pseudo-gout)   . COPD (chronic obstructive pulmonary disease)     not on meds, FEV1 39%, FVC 49%, ratio 0.59, not reversible w SABA (05/2008)  . Asthma   . Allergy   . Urge incontinence 2008    s/p urodynamics [Dahlstedt]  . Benign breast lumps     knows breast lumps, told scar tissue from implants  . Gout     Deveshwar/Landau  . PAC (premature atrial contraction)     chronic  . DJD (degenerative joint disease) of lumbar spine   .  Exertional shortness of breath   . Mass of left submandibular region 2015    s/p aspiration then excision by ENT  . Myocardial infarction  2004    "right before I had stent put in"   . Diabetes mellitus since 2012    diet controlled  . BCC (basal cell carcinoma of skin) 2014    L lower lip s/p excision    Past Surgical History  Procedure Laterality Date  . Cataract extraction w/ intraocular lens  implant, bilateral Bilateral 2010  . Inner ear surgery Bilateral     "had one surgery on each ear for hearing loss:  prosthesis implanted in right ear; metal piece placed in left ear"  . Coronary angioplasty with stent placement  2004    "1"   .  Colonoscopy    . Suprahyoid dissection Left 09/27/2013  . Tonsillectomy  1950's  . Augmentation mammaplasty Bilateral 1970's  . Basal cell carcinoma excision Left 2014    "lower lip"  . Neck mass excision Left 09/2013    LN with metastatic SCC unknown primary  . Radical neck dissection Left 09/27/2013    Procedure: LEFT SUPRAHYOID DISSECTION;  Surgeon: Melissa Montane, MD;  Location[facility];  Service: ENT;  Laterality: Left;   Review of Systems Per HPI unless specifically indicated above    Objective:    BP 128/62 mmHg  Pulse 74  Temp(Src) 98 F (36.7 C) (Oral)  Resp 14  Wt 126 lb 4 oz (57.267 kg)  SpO2 96%  Physical Exam  Constitutional: She appears well-developed and well-nourished. No distress.  HENT:  Mouth/Throat: Oropharynx is clear and moist. No oropharyngeal exudate.  Cardiovascular: Normal rate, regular rhythm, normal heart sounds and intact distal pulses.   No murmur heard. Pulmonary/Chest: Effort normal. No respiratory distress. She has decreased breath sounds. She has no wheezes. She has no rhonchi. She has no rales.  Coarse throughout with exp wheezing throughout  Musculoskeletal: She exhibits no edema.  Nursing note and vitals reviewed.      Assessment & Plan:   Problem List Items Addressed This Visit    Type 2 diabetes mellitus    Previously diet controlled diabetes, recently very well controlled. Anticipate prediabetes - check A1c today.    Relevant Orders      Hemoglobin A1c   Smoker    Re encourage smoking cessation. Discussed sandwich bag method.    HLD (hyperlipidemia)    Not fasting today - check dLDL today. Continue simvastatin and fish oil.    Relevant Medications      triamterene-hydrochlorothiazide (MAXZIDE-25) 37.5-25 MG per tablet   Other Relevant Orders      LDL Cholesterol, Direct   History of squamous cell carcinoma excision   Essential hypertension    Chronic, stable. Continue maxzide, amlodipine, metoprolol. maxzide refilled.     Relevant Medications      triamterene-hydrochlorothiazide (MAXZIDE-25) 37.5-25 MG per tablet   COPD (chronic obstructive pulmonary disease) - Primary    symbicort now too expensive, as it seems pt has entered donut hole. Will price out advair - sent to pharmacy. If unable to afford, may use spiriva she has at home. Discussed importance of controller med for COPD, and continue to encourage smoking cessation.    Relevant Medications      FLUTICASONE-SALMETEROL INHALER 250-50 MCG/PUFF       Follow up plan: Return in about 3 months (around 03/15/2014), or as needed, for medicare wellness.

## 2013-12-13 NOTE — Assessment & Plan Note (Signed)
Not fasting today - check dLDL today. Continue simvastatin and fish oil.

## 2013-12-13 NOTE — Assessment & Plan Note (Signed)
symbicort now too expensive, as it seems pt has entered donut hole. Will price out advair - sent to pharmacy. If unable to afford, may use spiriva she has at home. Discussed importance of controller med for COPD, and continue to encourage smoking cessation.

## 2013-12-13 NOTE — Progress Notes (Signed)
Pre visit review using our clinic review tool, if applicable. No additional management support is needed unless otherwise documented below in the visit note. 

## 2013-12-13 NOTE — Assessment & Plan Note (Signed)
Re encourage smoking cessation. Discussed sandwich bag method.

## 2013-12-14 ENCOUNTER — Telehealth: Payer: Self-pay | Admitting: Family Medicine

## 2013-12-14 NOTE — Telephone Encounter (Signed)
emmi mailed  °

## 2013-12-18 ENCOUNTER — Encounter: Payer: Self-pay | Admitting: *Deleted

## 2014-01-10 ENCOUNTER — Ambulatory Visit
Admission: RE | Admit: 2014-01-10 | Discharge: 2014-01-10 | Disposition: A | Discharge status: Home / Self Care | Payer: Medicare Other | Source: Ambulatory Visit

## 2014-01-10 ENCOUNTER — Encounter: Payer: Self-pay | Admitting: *Deleted

## 2014-01-10 ENCOUNTER — Other Ambulatory Visit: Payer: Self-pay

## 2014-01-10 DIAGNOSIS — Z1231 Encounter for screening mammogram for malignant neoplasm of breast: Secondary | ICD-10-CM

## 2014-01-10 LAB — HM MAMMOGRAPHY: HM Mammogram: NORMAL

## 2014-02-08 DIAGNOSIS — M810 Age-related osteoporosis without current pathological fracture: Secondary | ICD-10-CM

## 2014-02-08 HISTORY — DX: Age-related osteoporosis without current pathological fracture: M81.0

## 2014-02-08 LAB — HM DIABETES EYE EXAM

## 2014-02-12 ENCOUNTER — Other Ambulatory Visit: Payer: Self-pay | Admitting: Family Medicine

## 2014-02-12 NOTE — Telephone Encounter (Signed)
Ok to refill? Not on current med list. 

## 2014-02-14 ENCOUNTER — Other Ambulatory Visit: Payer: Self-pay | Admitting: *Deleted

## 2014-02-14 MED ORDER — BUDESONIDE-FORMOTEROL FUMARATE 160-4.5 MCG/ACT IN AERO: 2.0000 | INHALATION_SPRAY | Freq: Every day | RESPIRATORY_TRACT | Status: DC

## 2014-02-19 ENCOUNTER — Other Ambulatory Visit: Payer: Self-pay | Admitting: Family Medicine

## 2014-03-12 DIAGNOSIS — C44319 Basal cell carcinoma of skin of other parts of face: Secondary | ICD-10-CM | POA: Diagnosis not present

## 2014-03-12 DIAGNOSIS — D485 Neoplasm of uncertain behavior of skin: Secondary | ICD-10-CM | POA: Diagnosis not present

## 2014-03-12 DIAGNOSIS — C44310 Basal cell carcinoma of skin of unspecified parts of face: Secondary | ICD-10-CM | POA: Diagnosis not present

## 2014-03-12 DIAGNOSIS — L089 Local infection of the skin and subcutaneous tissue, unspecified: Secondary | ICD-10-CM | POA: Diagnosis not present

## 2014-03-27 DIAGNOSIS — H905 Unspecified sensorineural hearing loss: Secondary | ICD-10-CM | POA: Diagnosis not present

## 2014-04-08 ENCOUNTER — Other Ambulatory Visit: Payer: Self-pay | Admitting: Family Medicine

## 2014-04-08 DIAGNOSIS — E538 Deficiency of other specified B group vitamins: Secondary | ICD-10-CM

## 2014-04-08 DIAGNOSIS — E785 Hyperlipidemia, unspecified: Secondary | ICD-10-CM

## 2014-04-08 DIAGNOSIS — I10 Essential (primary) hypertension: Secondary | ICD-10-CM

## 2014-04-08 DIAGNOSIS — E119 Type 2 diabetes mellitus without complications: Secondary | ICD-10-CM

## 2014-04-09 ENCOUNTER — Other Ambulatory Visit (INDEPENDENT_AMBULATORY_CARE_PROVIDER_SITE_OTHER): Payer: Medicare Other

## 2014-04-09 DIAGNOSIS — E785 Hyperlipidemia, unspecified: Secondary | ICD-10-CM | POA: Diagnosis not present

## 2014-04-09 DIAGNOSIS — E119 Type 2 diabetes mellitus without complications: Secondary | ICD-10-CM

## 2014-04-09 DIAGNOSIS — I10 Essential (primary) hypertension: Secondary | ICD-10-CM

## 2014-04-09 DIAGNOSIS — E538 Deficiency of other specified B group vitamins: Secondary | ICD-10-CM

## 2014-04-09 LAB — RENAL FUNCTION PANEL
Albumin: 4 g/dL (ref 3.5–5.2)
BUN: 11 mg/dL (ref 6–23)
CALCIUM: 9.8 mg/dL (ref 8.4–10.5)
CHLORIDE: 101 meq/L (ref 96–112)
CO2: 31 mEq/L (ref 19–32)
CREATININE: 0.84 mg/dL (ref 0.40–1.20)
GFR: 69.47 mL/min (ref >60.00)
Glucose, Bld: 150 mg/dL — ABNORMAL HIGH (ref 70–99)
Phosphorus: 4.1 mg/dL (ref 2.3–4.6)
Potassium: 3.9 mEq/L (ref 3.5–5.1)
SODIUM: 140 meq/L (ref 135–145)

## 2014-04-09 LAB — LIPID PANEL
CHOL/HDL RATIO: 3
CHOLESTEROL: 131 mg/dL (ref 0–200)
HDL: 45.2 mg/dL (ref >39.00)
LDL Cholesterol: 58 mg/dL (ref 0–99)
NonHDL: 85.8
TRIGLYCERIDES: 138 mg/dL (ref 0.0–149.0)
VLDL: 27.6 mg/dL (ref 0.0–40.0)

## 2014-04-09 LAB — MICROALBUMIN / CREATININE URINE RATIO
Creatinine,U: 202.5 mg/dL
MICROALB UR: 1.2 mg/dL (ref 0.0–1.9)
Microalb Creat Ratio: 0.6 mg/g (ref 0.0–30.0)

## 2014-04-09 LAB — VITAMIN B12

## 2014-04-09 LAB — HEMOGLOBIN A1C: Hgb A1c MFr Bld: 7 % — ABNORMAL HIGH (ref 4.6–6.5)

## 2014-04-16 ENCOUNTER — Ambulatory Visit (INDEPENDENT_AMBULATORY_CARE_PROVIDER_SITE_OTHER): Payer: Medicare Other | Admitting: Family Medicine

## 2014-04-16 ENCOUNTER — Encounter: Payer: Self-pay | Admitting: Family Medicine

## 2014-04-16 VITALS — BP 128/66 | HR 67 | Temp 97.9°F | Ht 62.0 in | Wt 122.4 lb

## 2014-04-16 DIAGNOSIS — I2510 Atherosclerotic heart disease of native coronary artery without angina pectoris: Secondary | ICD-10-CM

## 2014-04-16 DIAGNOSIS — Z23 Encounter for immunization: Secondary | ICD-10-CM | POA: Diagnosis not present

## 2014-04-16 DIAGNOSIS — E2839 Other primary ovarian failure: Secondary | ICD-10-CM

## 2014-04-16 DIAGNOSIS — E118 Type 2 diabetes mellitus with unspecified complications: Secondary | ICD-10-CM

## 2014-04-16 DIAGNOSIS — I10 Essential (primary) hypertension: Secondary | ICD-10-CM

## 2014-04-16 DIAGNOSIS — M109 Gout, unspecified: Secondary | ICD-10-CM

## 2014-04-16 DIAGNOSIS — J449 Chronic obstructive pulmonary disease, unspecified: Secondary | ICD-10-CM

## 2014-04-16 DIAGNOSIS — Z0000 Encounter for general adult medical examination without abnormal findings: Secondary | ICD-10-CM

## 2014-04-16 DIAGNOSIS — Z7189 Other specified counseling: Secondary | ICD-10-CM | POA: Insufficient documentation

## 2014-04-16 DIAGNOSIS — F17200 Nicotine dependence, unspecified, uncomplicated: Secondary | ICD-10-CM

## 2014-04-16 DIAGNOSIS — E785 Hyperlipidemia, unspecified: Secondary | ICD-10-CM

## 2014-04-16 DIAGNOSIS — R634 Abnormal weight loss: Secondary | ICD-10-CM | POA: Insufficient documentation

## 2014-04-16 NOTE — Progress Notes (Signed)
BP 128/66 mmHg  Pulse 67  Temp(Src) 97.9 F (36.6 C) (Oral)  Ht 5\' 2"  (1.575 m)  Wt 122 lb 6.4 oz (55.52 kg)  BMI 22.38 kg/m2  SpO2 95%   CC: medicare wellness visit  Subjective:    Patient ID: Desiree Floyd, female    DOB: 1932-10-23, 81 y.o.   MRN: [id number]  HPI: Desiree Floyd is a 81 y.o. female presenting on 04/16/2014 for Annual Exam   Some recent diarrhea treated with pepto bismol and today feeling better.  Weight creeping down.   Has appt with derm tomorrow.  Hearing screen - wears hearing aides. significantly hard of hearing. Vision screen - had this checked 2015.  No falls in last year.  Mood stable/no anhedonia.  Preventative: Colonoscopy - thinks done 09/2005, normal per patient - will again request records today [facility]). Mammogram - Birads1 - 01/2014 Cervical cancer - has aged out of pap smear. Discussed self exams.  Lung cancer screening - discussed. Pt opts to defer for now. DEXA - has not had done in past - will order. flu [facility] 11/2013 tetanus shot - June 2011  Pneumovax 2009, prevnar today Shingles shot 2013. Advanced directives: living will scanned in chart (08/2012). HCPOA are son and daughter. "I don't want to be put on machine". Doesn't want prolonged life support.   Decaf coffee Lives with husband and no pets Children nearby Occupation: retired, worked for school in [employer] Activity: no regular exercise, wants to start walking Diet: good water, fruits/vegetables daily  Relevant past medical, surgical, family and social history reviewed and updated as indicated. Interim medical history since our last visit reviewed. Allergies and medications reviewed and updated. Current Outpatient Prescriptions on File Prior to Visit  Medication Sig  . acetaminophen (TYLENOL) 500 MG tablet Take 1,000 mg by mouth every 6 (six) hours as needed for moderate pain.  Marland Kitchen albuterol (PROVENTIL HFA;VENTOLIN HFA) 108 (90 BASE) MCG/ACT inhaler Inhale  2 puffs into the lungs every 6 (six) hours as needed for wheezing or shortness of breath.  Marland Kitchen amLODipine (NORVASC) 5 MG tablet Take 1 tablet by mouth  daily  . aspirin 81 MG tablet Take 81 mg by mouth daily.    . budesonide-formoterol (SYMBICORT) 160-4.5 MCG/ACT inhaler Inhale 2 puffs into the lungs daily.  Marland Kitchen glucose blood (ONE TOUCH TEST STRIPS) test strip 1 each by Other route See admin instructions. Check blood sugar once daily.  Marland Kitchen glucose blood (ONE TOUCH ULTRA TEST) test strip Use to check blood sugar once daily and as directed. Dx: E11.9  . metoprolol tartrate (LOPRESSOR) 25 MG tablet Take one-half tablet by  mouth in the morning and in the evening  . Omega-3 Fatty Acids (FISH OIL) 1000 MG CAPS Take 1,000 mg by mouth daily.   . simvastatin (ZOCOR) 20 MG tablet Take 1 tablet by mouth at  bedtime  . SPIRIVA HANDIHALER 18 MCG inhalation capsule Inhale the contents of 1  capsule via HandiHaler  daily  . triamterene-hydrochlorothiazide (MAXZIDE-25) 37.5-25 MG per tablet Take 1 tablet by mouth daily.  . vitamin B-12 (CYANOCOBALAMIN) 1000 MCG tablet Take 1,000 mcg by mouth daily.  Marland Kitchen HYDROcodone-acetaminophen (LORTAB) 5-325 MG per tablet Take 1 tablet by mouth every 6 (six) hours as needed for moderate pain. (Patient not taking: Reported on 04/16/2014)   No current facility-administered medications on file prior to visit.    Review of Systems  Constitutional: Negative for fever, chills, activity change, appetite change, fatigue and unexpected weight change.  HENT: Negative for hearing loss.   Eyes: Negative for visual disturbance.  Respiratory: Positive for cough (occasional). Negative for chest tightness, shortness of breath and wheezing.   Cardiovascular: Negative for chest pain, palpitations and leg swelling.  Gastrointestinal: Positive for diarrhea (over weekend). Negative for nausea, vomiting, abdominal pain, constipation, blood in stool and abdominal distention.  Genitourinary: Negative for  hematuria and difficulty urinating.  Musculoskeletal: Negative for myalgias, arthralgias and neck pain.  Skin: Negative for rash.  Neurological: Negative for dizziness, seizures, syncope and headaches.  Hematological: Negative for adenopathy. Does not bruise/bleed easily.  Psychiatric/Behavioral: Negative for dysphoric mood. The patient is not nervous/anxious.    Per HPI unless specifically indicated above     Objective:    BP 128/66 mmHg  Pulse 67  Temp(Src) 97.9 F (36.6 C) (Oral)  Ht 5\' 2"  (1.575 m)  Wt 122 lb 6.4 oz (55.52 kg)  BMI 22.38 kg/m2  SpO2 95%  Wt Readings from Last 3 Encounters:  04/16/14 122 lb 6.4 oz (55.52 kg)  12/13/13 126 lb 4 oz (57.267 kg)  10/09/13 129 lb (58.514 kg)    Physical Exam  Constitutional: She is oriented to person, place, and time. She appears well-developed and well-nourished. No distress.  HENT:  Head: Normocephalic and atraumatic.  Right Ear: Hearing, tympanic membrane, external ear and ear canal normal.  Left Ear: Hearing, tympanic membrane, external ear and ear canal normal.  Nose: Nose normal.  Mouth/Throat: Uvula is midline, oropharynx is clear and moist and mucous membranes are normal. No oropharyngeal exudate, posterior oropharyngeal edema or posterior oropharyngeal erythema.  Eyes: Conjunctivae and EOM are normal. Pupils are equal, round, and reactive to light. No scleral icterus.  Neck: Normal range of motion. Neck supple. Carotid bruit is not present. No thyromegaly present.  Cardiovascular: Normal rate, regular rhythm, normal heart sounds and intact distal pulses.   No murmur heard. Pulses:      Radial pulses are 2+ on the right side, and 2+ on the left side.  Pulmonary/Chest: Effort normal and breath sounds normal. No respiratory distress. She has no wheezes. She has no rales.  Breast exam - pt declines today. Does home self breast exams  Abdominal: Soft. Bowel sounds are normal. She exhibits no distension and no mass. There is  no tenderness. There is no rebound and no guarding.  Genitourinary:  GYN - aged out  Musculoskeletal: Normal range of motion. She exhibits no edema.  Lymphadenopathy:    She has no cervical adenopathy.  Neurological: She is alert and oriented to person, place, and time.  CN grossly intact, station and gait intact Recall 3/3 Calculation 4/5 D-L-R-O-L  Skin: Skin is warm and dry. No rash noted.  Psychiatric: She has a normal mood and affect. Her behavior is normal. Judgment and thought content normal.  Nursing note and vitals reviewed.  Results for orders placed or performed in visit on 04/09/14  Lipid panel  Result Value Ref Range   Cholesterol 131 0 - 200 mg/dL   Triglycerides 138.0 0.0 - 149.0 mg/dL   HDL 45.20 >39.00 mg/dL   VLDL 27.6 0.0 - 40.0 mg/dL   LDL Cholesterol 58 0 - 99 mg/dL   Total CHOL/HDL Ratio 3    NonHDL 85.80   Hemoglobin A1c  Result Value Ref Range   Hgb A1c MFr Bld 7.0 (H) 4.6 - 6.5 %  Vitamin B12  Result Value Ref Range   Vitamin B-12 >1500 (H) 211 - 911 pg/mL  Renal function panel  Result Value Ref Range   Sodium 140 135 - 145 mEq/L   Potassium 3.9 3.5 - 5.1 mEq/L   Chloride 101 96 - 112 mEq/L   CO2 31 19 - 32 mEq/L   Calcium 9.8 8.4 - 10.5 mg/dL   Albumin 4.0 3.5 - 5.2 g/dL   BUN 11 6 - 23 mg/dL   Creatinine, Ser 0.84 0.40 - 1.20 mg/dL   Glucose, Bld 150 (H) 70 - 99 mg/dL   Phosphorus 4.1 2.3 - 4.6 mg/dL   GFR 69.47 >60.00 mL/min  Microalbumin / creatinine urine ratio  Result Value Ref Range   Microalb, Ur 1.2 0.0 - 1.9 mg/dL   Creatinine,U 202.5 mg/dL   Microalb Creat Ratio 0.6 0.0 - 30.0 mg/g      Assessment & Plan:   Problem List Items Addressed This Visit    Smoker    Restarted smoking. Continue to encourage full cessation. Precontemplative. Discussed lung cancer screening with CT scan. Pt decides to defer for now.      Medicare annual wellness visit, subsequent - Primary    I have personally reviewed the Medicare Annual Wellness  questionnaire and have noted 1. The patient's medical and social history 2. Their use of alcohol, tobacco or illicit drugs 3. Their current medications and supplements 4. The patient's functional ability including ADL's, fall risks, home safety risks and hearing or visual impairment. 5. Diet and physical activity 6. Evidence for depression or mood disorders The patients weight, height, BMI have been recorded in the chart.  Hearing and vision has been addressed. I have made referrals, counseling and provided education to the patient based review of the above and I have provided the pt with a written personalized care plan for preventive services. Provider list updated - see scanned questionairre. Reviewed preventative protocols and updated unless pt declined.       Loss of weight    Recent weight loss attributed to recent stomach bug. Pt not concerned.      HLD (hyperlipidemia)    Chronic, stable. Continue current regimen (simvastatin and fish oil x1).      Health maintenance examination    Preventative protocols reviewed and updated unless pt declined. Discussed healthy diet and lifestyle.      Gout    No recent flare - will slowly taper down on allopurinol. PRN f/u left with rheum.      Essential hypertension    Chronic, stable. Continue current regimen.      Diabetes mellitus type 2, controlled, with complications    Chronic, stable. Continue diet controlled.      COPD (chronic obstructive pulmonary disease)    Compliant with current med regimen (symbicort and spiriva). Continue to encourage cessation. Denies sxs of COPD exacerbation currently.      CAD (coronary artery disease)    Currently asxs.      Advanced care planning/counseling discussion    Advanced directives: living will scanned in chart (08/2012). HCPOA are son and daughter. "I don't want to be put on machine". Doesn't want prolonged life support.        Other Visit Diagnoses    Estrogen deficiency         Relevant Orders    DG Bone Density        Follow up plan: Return as needed, for follow up visit.

## 2014-04-16 NOTE — Patient Instructions (Addendum)
prevnar today. Bone density scan ordered today. Check on when your next eye appointment is due. Sign release up front for records of colonoscopy Cut allopurinol dose in half (150mg  daily). When you can, decrease vitamin B12 to 580mcg daily. Return as needed or in 6 months for follow up visit. Monitor weight and if continues dropping, return sooner for recheck.

## 2014-04-16 NOTE — Assessment & Plan Note (Signed)
Chronic, stable. Continue current regimen. 

## 2014-04-16 NOTE — Assessment & Plan Note (Signed)
Advanced directives: living will scanned in chart (08/2012). HCPOA are son and daughter. "I don't want to be put on machine". Doesn't want prolonged life support.

## 2014-04-16 NOTE — Assessment & Plan Note (Signed)

## 2014-04-16 NOTE — Assessment & Plan Note (Signed)
Compliant with current med regimen (symbicort and spiriva). Continue to encourage cessation. Denies sxs of COPD exacerbation currently.

## 2014-04-16 NOTE — Assessment & Plan Note (Signed)
Recent weight loss attributed to recent stomach bug. Pt not concerned.

## 2014-04-16 NOTE — Assessment & Plan Note (Addendum)
Chronic, stable. Continue current regimen (simvastatin and fish oil x1).

## 2014-04-16 NOTE — Assessment & Plan Note (Signed)
Restarted smoking. Continue to encourage full cessation. Precontemplative. Discussed lung cancer screening with CT scan. Pt decides to defer for now.

## 2014-04-16 NOTE — Assessment & Plan Note (Signed)
Preventative protocols reviewed and updated unless pt declined. Discussed healthy diet and lifestyle.  

## 2014-04-16 NOTE — Assessment & Plan Note (Signed)
No recent flare - will slowly taper down on allopurinol. PRN f/u left with rheum.

## 2014-04-16 NOTE — Progress Notes (Signed)
Pre visit review using our clinic review tool, if applicable. No additional management support is needed unless otherwise documented below in the visit note. 

## 2014-04-16 NOTE — Assessment & Plan Note (Signed)
Chronic, stable. Continue diet controlled.

## 2014-04-16 NOTE — Addendum Note (Signed)
Addended by: Jacqualin Combes on: 04/16/2014 10:48 AM   Modules accepted: Orders

## 2014-04-16 NOTE — Assessment & Plan Note (Signed)
Currently asxs.

## 2014-04-17 DIAGNOSIS — X32XXXD Exposure to sunlight, subsequent encounter: Secondary | ICD-10-CM | POA: Diagnosis not present

## 2014-04-17 DIAGNOSIS — Z08 Encounter for follow-up examination after completed treatment for malignant neoplasm: Secondary | ICD-10-CM | POA: Diagnosis not present

## 2014-04-17 DIAGNOSIS — Z85828 Personal history of other malignant neoplasm of skin: Secondary | ICD-10-CM | POA: Diagnosis not present

## 2014-04-17 DIAGNOSIS — L570 Actinic keratosis: Secondary | ICD-10-CM | POA: Diagnosis not present

## 2014-05-15 DIAGNOSIS — C4442 Squamous cell carcinoma of skin of scalp and neck: Secondary | ICD-10-CM | POA: Diagnosis not present

## 2014-05-28 ENCOUNTER — Other Ambulatory Visit: Payer: Self-pay | Admitting: Family Medicine

## 2014-06-11 ENCOUNTER — Ambulatory Visit
Admission: RE | Admit: 2014-06-11 | Discharge: 2014-06-11 | Disposition: A | Discharge status: Home / Self Care | Payer: Medicare Other | Source: Ambulatory Visit | Attending: Family Medicine | Admitting: Family Medicine

## 2014-06-11 DIAGNOSIS — E2839 Other primary ovarian failure: Secondary | ICD-10-CM

## 2014-06-11 DIAGNOSIS — M810 Age-related osteoporosis without current pathological fracture: Secondary | ICD-10-CM | POA: Diagnosis not present

## 2014-06-16 ENCOUNTER — Encounter: Payer: Self-pay | Admitting: Family Medicine

## 2014-06-16 DIAGNOSIS — M810 Age-related osteoporosis without current pathological fracture: Secondary | ICD-10-CM | POA: Insufficient documentation

## 2014-06-27 ENCOUNTER — Ambulatory Visit (INDEPENDENT_AMBULATORY_CARE_PROVIDER_SITE_OTHER): Payer: Medicare Other | Admitting: Family Medicine

## 2014-06-27 ENCOUNTER — Encounter: Payer: Self-pay | Admitting: Family Medicine

## 2014-06-27 ENCOUNTER — Other Ambulatory Visit: Payer: Self-pay | Admitting: Family Medicine

## 2014-06-27 VITALS — BP 122/74 | HR 68 | Temp 97.8°F | Wt 123.2 lb

## 2014-06-27 DIAGNOSIS — M810 Age-related osteoporosis without current pathological fracture: Secondary | ICD-10-CM

## 2014-06-27 DIAGNOSIS — F17200 Nicotine dependence, unspecified, uncomplicated: Secondary | ICD-10-CM

## 2014-06-27 DIAGNOSIS — Z720 Tobacco use: Secondary | ICD-10-CM | POA: Diagnosis not present

## 2014-06-27 LAB — VITAMIN D 25 HYDROXY (VIT D DEFICIENCY, FRACTURES): VITD: 17.83 ng/mL — AB (ref 30.00–100.00)

## 2014-06-27 MED ORDER — VITAMIN D3 50 MCG (2000 UT) PO TABS: 2000.0000 [IU] | ORAL_TABLET | Freq: Every day | ORAL | Status: DC

## 2014-06-27 MED ORDER — ALENDRONATE SODIUM 70 MG PO TABS: 70.0000 mg | ORAL_TABLET | ORAL | Status: DC

## 2014-06-27 MED ORDER — CALCIUM CARBONATE-VITAMIN D 600-400 MG-UNIT PO CHEW: 1.0000 | CHEWABLE_TABLET | Freq: Every day | ORAL | Status: DC

## 2014-06-27 MED ORDER — VITAMIN D 1000 UNITS PO TABS: 1000.0000 [IU] | ORAL_TABLET | Freq: Every day | ORAL | Status: DC

## 2014-06-27 NOTE — Assessment & Plan Note (Signed)
Strongly encouraged smoking cessation. 

## 2014-06-27 NOTE — Progress Notes (Signed)
BP 122/74 mmHg  Pulse 68  Temp(Src) 97.8 F (36.6 C) (Oral)  Wt 123 lb 4 oz (55.906 kg)  SpO2 97%   CC: osteoporosis management  Subjective:    Patient ID: Desiree Floyd, female    DOB: 27-Aug-1932, 81 y.o.   MRN: [id number]  HPI: Desiree Floyd is a 81 y.o. female presenting on 06/27/2014 for Follow-up   Osteoporosis by recent dexa. Date: 2016 T -2.7 spine, -2.6 hip  Smoker 1/2 ppd (briefly stopped). No h/o fragility fracture or other fracture. Known osteoarthritis, CPPD and COPD. No regular exercise.   Relevant past medical, surgical, family and social history reviewed and updated as indicated. Interim medical history since our last visit reviewed. Allergies and medications reviewed and updated. Current Outpatient Prescriptions on File Prior to Visit  Medication Sig  . acetaminophen (TYLENOL) 500 MG tablet Take 1,000 mg by mouth every 6 (six) hours as needed for moderate pain.  Marland Kitchen albuterol (PROVENTIL HFA;VENTOLIN HFA) 108 (90 BASE) MCG/ACT inhaler Inhale 2 puffs into the lungs every 6 (six) hours as needed for wheezing or shortness of breath.  . allopurinol (ZYLOPRIM) 300 MG tablet Take 0.5 tablets (150 mg total) by mouth daily.  Marland Kitchen amLODipine (NORVASC) 5 MG tablet Take 1 tablet by mouth  daily  . aspirin 81 MG tablet Take 81 mg by mouth daily.    . budesonide-formoterol (SYMBICORT) 160-4.5 MCG/ACT inhaler Inhale 2 puffs into the lungs daily.  Marland Kitchen glucose blood (ONE TOUCH ULTRA TEST) test strip Use to check blood sugar once daily and as directed. Dx: E11.9  . HYDROcodone-acetaminophen (LORTAB) 5-325 MG per tablet Take 1 tablet by mouth every 6 (six) hours as needed for moderate pain.  . metoprolol tartrate (LOPRESSOR) 25 MG tablet Take one-half tablet by  mouth in the morning and in the evening  . Omega-3 Fatty Acids (FISH OIL) 1000 MG CAPS Take 1,000 mg by mouth daily.   . simvastatin (ZOCOR) 20 MG tablet Take 1 tablet by mouth at  bedtime  . SPIRIVA HANDIHALER 18 MCG  inhalation capsule Inhale the contents of 1  capsule via HandiHaler  daily  . triamterene-hydrochlorothiazide (MAXZIDE-25) 37.5-25 MG per tablet Take 1 tablet by mouth daily.  . vitamin B-12 (CYANOCOBALAMIN) 1000 MCG tablet Take 1,000 mcg by mouth daily.   No current facility-administered medications on file prior to visit.   Past Medical History  Diagnosis Date  . Hypertension   . Hyperlipidemia   . CAD (coronary artery disease) 2004    s/p stent [Jordan]  . Smoker     1/2 ppd  . OA (osteoarthritis)     of hands  . CPPD (pseudo-gout)   . COPD (chronic obstructive pulmonary disease)     not on meds, FEV1 39%, FVC 49%, ratio 0.59, not reversible w SABA (05/2008)  . Asthma   . Allergy   . Urge incontinence 2008    s/p urodynamics [Dahlstedt]  . Benign breast lumps     knows breast lumps, told scar tissue from implants  . Gout     Deveshwar/Landau  . PAC (premature atrial contraction)     chronic  . DJD (degenerative joint disease) of lumbar spine   . Exertional shortness of breath   . Mass of left submandibular region 2015    s/p aspiration then excision by ENT  . Myocardial infarction  2004    "right before I had stent put in"   . Diabetes mellitus since 2012    diet controlled  .  BCC (basal cell carcinoma of skin) 2014    L lower lip s/p excision  . Osteoporosis 2016    T -2.7 spine, -2.6 hip    Review of Systems Per HPI unless specifically indicated above     Objective:    BP 122/74 mmHg  Pulse 68  Temp(Src) 97.8 F (36.6 C) (Oral)  Wt 123 lb 4 oz (55.906 kg)  SpO2 97%  Wt Readings from Last 3 Encounters:  06/27/14 123 lb 4 oz (55.906 kg)  04/16/14 122 lb 6.4 oz (55.52 kg)  12/13/13 126 lb 4 oz (57.267 kg)   Body mass index is 22.54 kg/(m^2).  Physical Exam  Constitutional: She appears well-developed and well-nourished. No distress.  Psychiatric: She has a normal mood and affect.  Nursing note and vitals reviewed.  Results for orders placed or performed  in visit on 04/09/14  Lipid panel  Result Value Ref Range   Cholesterol 131 0 - 200 mg/dL   Triglycerides 138.0 0.0 - 149.0 mg/dL   HDL 45.20 >39.00 mg/dL   VLDL 27.6 0.0 - 40.0 mg/dL   LDL Cholesterol 58 0 - 99 mg/dL   Total CHOL/HDL Ratio 3    NonHDL 85.80   Hemoglobin A1c  Result Value Ref Range   Hgb A1c MFr Bld 7.0 (H) 4.6 - 6.5 %  Vitamin B12  Result Value Ref Range   Vitamin B-12 >1500 (H) 211 - 911 pg/mL  Renal function panel  Result Value Ref Range   Sodium 140 135 - 145 mEq/L   Potassium 3.9 3.5 - 5.1 mEq/L   Chloride 101 96 - 112 mEq/L   CO2 31 19 - 32 mEq/L   Calcium 9.8 8.4 - 10.5 mg/dL   Albumin 4.0 3.5 - 5.2 g/dL   BUN 11 6 - 23 mg/dL   Creatinine, Ser 0.84 0.40 - 1.20 mg/dL   Glucose, Bld 150 (H) 70 - 99 mg/dL   Phosphorus 4.1 2.3 - 4.6 mg/dL   GFR 69.47 >60.00 mL/min  Microalbumin / creatinine urine ratio  Result Value Ref Range   Microalb, Ur 1.2 0.0 - 1.9 mg/dL   Creatinine,U 202.5 mg/dL   Microalb Creat Ratio 0.6 0.0 - 30.0 mg/g      Assessment & Plan:   Problem List Items Addressed This Visit    Osteoporosis - Primary    Discussed dx and treatment options. Will start fosamax 70mg  weekly. Discussed administration of medication and side effects to monitor for including but not limited to hip or jaw pain/atypical fractures. rec take med for next 5 yrs. Consider rpt dexa in 1-2 yrs to monitor effect of med. Strongly encouraged smoking cessation. Discussed calcium/vitamin D supplementation and recommended dietary supplementation first. rec start cal/vit D 1 supplement daily. Check vit D levels today.      Relevant Medications   alendronate (FOSAMAX) 70 MG tablet   Calcium Carbonate-Vitamin D (CALCIUM 600/VITAMIN D) 600-400 MG-UNIT per chew tablet   cholecalciferol (VITAMIN D) 1000 UNITS tablet   Other Relevant Orders   Vit D  25 hydroxy (rtn osteoporosis monitoring)   Smoker    Strongly encouraged smoking cessation.          Follow up  plan: Return as needed.

## 2014-06-27 NOTE — Assessment & Plan Note (Signed)
Discussed dx and treatment options. Will start fosamax 70mg  weekly. Discussed administration of medication and side effects to monitor for including but not limited to hip or jaw pain/atypical fractures. rec take med for next 5 yrs. Consider rpt dexa in 1-2 yrs to monitor effect of med. Strongly encouraged smoking cessation. Discussed calcium/vitamin D supplementation and recommended dietary supplementation first. rec start cal/vit D 1 supplement daily. Check vit D levels today.

## 2014-06-27 NOTE — Progress Notes (Signed)
Pre visit review using our clinic review tool, if applicable. No additional management support is needed unless otherwise documented below in the visit note. 

## 2014-06-27 NOTE — Patient Instructions (Addendum)
Bone denisty showed osteoporosis: -2.7 in spine and -2.6 in hip. Start calcium /vitamin D supplement 600/400 once daily. Start separate vitamin D 1000 units daily. Check vitamin D level today. Start medicine fosamax once weekly.  This medicine has been sent to pharmacy. Watch for jaw or hip pain. Let us know if any planned dental work while you're on this medicine. I recommend taking for 5 years and then reassessing.  Increase weight bearing aerobic exercise.   Try to get most or all of your calcium from your food--aim for 1200 mg/day  To figure out dietary calcium: 300 mg/day from all non dairy foods plus 300 mg per cup of milk, other dairy, or fortified juice. Non dairy foods that contain calcium: Kale, oranges, sardines, oatmeal, soy milk/soybeans, salmon, white beans, dried figs, turnip greens, almonds, broccoli, tofu.  Osteoporosis Throughout your life, your body breaks down old bone and replaces it with new bone. As you get older, your body does not replace bone as quickly as it breaks it down. By the age of 31 years, most people begin to gradually lose bone because of the imbalance between bone loss and replacement. Some people lose more bone than others. Bone loss beyond a specified normal degree is considered osteoporosis.  Osteoporosis affects the strength and durability of your bones. The inside of the ends of your bones and your flat bones, like the bones of your pelvis, look like honeycomb, filled with tiny open spaces. As bone loss occurs, your bones become less dense. This means that the open spaces inside your bones become bigger and the walls between these spaces become thinner. This makes your bones weaker. Bones of a person with osteoporosis can become so weak that they can break (fracture) during minor accidents, such as a simple fall. CAUSES  The following factors have been associated with the development of osteoporosis:  Smoking.  Drinking more than 2 alcoholic drinks  several days per week.  Long-term use of certain medicines:  Corticosteroids.  Chemotherapy medicines.  Thyroid medicines.  Antiepileptic medicines.  Gonadal hormone suppression medicine.  Immunosuppression medicine.  Being underweight.  Lack of physical activity.  Lack of exposure to the sun. This can lead to vitamin D deficiency.  Certain medical conditions:  Certain inflammatory bowel diseases, such as Crohn disease and ulcerative colitis.  Diabetes.  Hyperthyroidism.  Hyperparathyroidism. RISK FACTORS Anyone can develop osteoporosis. However, the following factors can increase your risk of developing osteoporosis:  Gender--Women are at higher risk than men.  Age--Being older than 50 years increases your risk.  Ethnicity--White and Asian people have an increased risk.  Weight --Being extremely underweight can increase your risk of osteoporosis.  Family history of osteoporosis--Having a family member who has developed osteoporosis can increase your risk. SYMPTOMS  Usually, people with osteoporosis have no symptoms.  DIAGNOSIS  Signs during a physical exam that may prompt your caregiver to suspect osteoporosis include:  Decreased height. This is usually caused by the compression of the bones that form your spine (vertebrae) because they have weakened and become fractured.  A curving or rounding of the upper back (kyphosis). To confirm signs of osteoporosis, your caregiver may request a procedure that uses 2 low-dose X-ray beams with different levels of energy to measure your bone mineral density (dual-energy X-ray absorptiometry [DXA]). Also, your caregiver may check your level of vitamin D. TREATMENT  The goal of osteoporosis treatment is to strengthen bones in order to decrease the risk of bone fractures. There are different types  of medicines available to help achieve this goal. Some of these medicines work by slowing the processes of bone loss. Some medicines  work by increasing bone density. Treatment also involves making sure that your levels of calcium and vitamin D are adequate. PREVENTION  There are things you can do to help prevent osteoporosis. Adequate intake of calcium and vitamin D can help you achieve optimal bone mineral density. Regular exercise can also help, especially resistance and weight-bearing activities. If you smoke, quitting smoking is an important part of osteoporosis prevention. MAKE SURE YOU:  Understand these instructions.  Will watch your condition.  Will get help right away if you are not doing well or get worse. FOR MORE INFORMATION www.osteo.org and [employer].com.ee Document Released: 11/04/2004 Document Revised: 05/22/2012 Document Reviewed: 01/09/2011 [facility] Patient Information 2015 [place], [place]. This information is not intended to replace advice given to you by your health care provider. Make sure you discuss any questions you have with your health care provider.

## 2014-07-05 ENCOUNTER — Encounter: Payer: Self-pay | Admitting: Family Medicine

## 2014-07-12 ENCOUNTER — Encounter: Payer: Self-pay | Admitting: Internal Medicine

## 2014-07-12 ENCOUNTER — Ambulatory Visit (INDEPENDENT_AMBULATORY_CARE_PROVIDER_SITE_OTHER): Payer: Medicare Other | Admitting: Internal Medicine

## 2014-07-12 VITALS — BP 120/80 | HR 77 | Temp 97.9°F | Wt 122.0 lb

## 2014-07-12 DIAGNOSIS — J449 Chronic obstructive pulmonary disease, unspecified: Secondary | ICD-10-CM | POA: Diagnosis not present

## 2014-07-12 DIAGNOSIS — J0110 Acute frontal sinusitis, unspecified: Secondary | ICD-10-CM | POA: Diagnosis not present

## 2014-07-12 MED ORDER — AMOXICILLIN 500 MG PO TABS: 1000.0000 mg | ORAL_TABLET | Freq: Two times a day (BID) | ORAL | Status: DC

## 2014-07-12 NOTE — Progress Notes (Signed)
Subjective:    Patient ID: Desiree Floyd, female    DOB: [birth year], 81 y.o.   MRN: [id number]  HPI Here due to cough  Has been achy and intermittent cough in spells -- 3 days Brought up yellow mucus this AM Yellow nasal drainage from nose also  Frontal pressure No sore throat or ear pain On inhalers---stable DOE (no worse with this) Still smoking--discussed but she doesn't feel ready  No fever Hot flashes at night--- wet this AM (not totally new though)  Gargling with listerine/peroxide--some help  Current Outpatient Prescriptions on File Prior to Visit  Medication Sig Dispense Refill  . acetaminophen (TYLENOL) 500 MG tablet Take 1,000 mg by mouth every 6 (six) hours as needed for moderate pain.    Marland Kitchen alendronate (FOSAMAX) 70 MG tablet Take 1 tablet (70 mg total) by mouth every 7 (seven) days. Take with a full glass of water on an empty stomach. 4 tablet 11  . allopurinol (ZYLOPRIM) 300 MG tablet Take 0.5 tablets (150 mg total) by mouth daily.    Marland Kitchen amLODipine (NORVASC) 5 MG tablet Take 1 tablet by mouth  daily 90 tablet 3  . aspirin 81 MG tablet Take 81 mg by mouth daily.      . budesonide-formoterol (SYMBICORT) 160-4.5 MCG/ACT inhaler Inhale 2 puffs into the lungs daily. 3 Inhaler 3  . Calcium Carbonate-Vitamin D (CALCIUM 600/VITAMIN D) 600-400 MG-UNIT per chew tablet Chew 1 tablet by mouth daily.    . cholecalciferol 2000 UNITS TABS Take 2,000 Units by mouth daily.    Marland Kitchen glucose blood (ONE TOUCH ULTRA TEST) test strip Use to check blood sugar once daily and as directed. Dx: E11.9 100 each 1  . metoprolol tartrate (LOPRESSOR) 25 MG tablet Take one-half tablet by  mouth in the morning and in the evening 90 tablet 3  . simvastatin (ZOCOR) 20 MG tablet Take 1 tablet by mouth at  bedtime 90 tablet 3  . SPIRIVA HANDIHALER 18 MCG inhalation capsule Inhale the contents of 1  capsule via HandiHaler  daily 90 capsule 3  . triamterene-hydrochlorothiazide (MAXZIDE-25) 37.5-25 MG per  tablet Take 1 tablet by mouth daily. 90 tablet 3  . vitamin B-12 (CYANOCOBALAMIN) 1000 MCG tablet Take 1,000 mcg by mouth daily.     No current facility-administered medications on file prior to visit.    Allergies  Allergen Reactions  . Augmentin [Amoxicillin-Pot Clavulanate] Other (See Comments)    Diarrhea and vomiting  . Chantix [Varenicline] Other (See Comments)    Bad dreams  . Lisinopril Cough    Past Medical History  Diagnosis Date  . Hypertension   . Hyperlipidemia   . CAD (coronary artery disease) 2004    s/p stent [[place]]  . Smoker     1/2 ppd  . OA (osteoarthritis)     of hands  . CPPD (pseudo-gout)   . COPD (chronic obstructive pulmonary disease)     not on meds, FEV1 39%, FVC 49%, ratio 0.59, not reversible w SABA (05/2008)  . Asthma   . Allergy   . Urge incontinence 2008    s/p urodynamics [[place]]  . Benign breast lumps     knows breast lumps, told scar tissue from implants  . Gout     Deveshwar/[place]  . PAC (premature atrial contraction)     chronic  . DJD (degenerative joint disease) of lumbar spine   . Exertional shortness of breath   . Mass of left submandibular region 2015  s/p aspiration then excision by ENT  . Myocardial infarction  2004    "right before I had stent put in"   . Diabetes mellitus since 2012    diet controlled  . BCC (basal cell carcinoma of skin) 2014    L lower lip s/p excision  . Osteoporosis 2016    T -2.7 spine, -2.6 hip    Past Surgical History  Procedure Laterality Date  . Cataract extraction w/ intraocular lens  implant, bilateral Bilateral 2010  . Inner ear surgery Bilateral     "had one surgery on each ear for hearing loss:  prosthesis implanted in right ear; metal piece placed in left ear"  . Coronary angioplasty with stent placement  2004    "1"   . Colonoscopy    . Suprahyoid dissection Left 09/27/2013  . Tonsillectomy  1950's  . Augmentation mammaplasty Bilateral 1970's  . Basal cell carcinoma  excision Left 2014    "lower lip"  . Neck mass excision Left 09/2013    LN with metastatic SCC unknown primary  . Radical neck dissection Left 09/27/2013    Procedure: LEFT SUPRAHYOID DISSECTION;  Surgeon: Melissa Montane, MD;  Location: [facility] OR;  Service: ENT;  Laterality: Left;    Family History  Problem Relation Age of Onset  . Heart disease Father 50    CAD AND HEART ATTACK  . Other Mother     old age, age 63  . Heart disease Mother     History   Social History  . Marital Status: Married    Spouse Name: N/A  . Number of Children: 2  . Years of Education: N/A   Occupational History  . [employer]    Social History Main Topics  . Smoking status: Current Every Day Smoker -- 0.50 packs/day for 58 years    Types: Cigarettes  . Smokeless tobacco: Never Used  . Alcohol Use: Yes     Comment: 09/27/2013  "maybe a glass of wine twice/yr"  . Drug Use: No  . Sexual Activity: No   Other Topics Concern  . Not on file   Social History Narrative   Decaf coffee   Lives with husband and no pets   Children nearby   Occupation: retired, worked for school in [employer]   Activity: no regular exercise, wants to start walking   Diet: good water, fruits/vegetables daily   Review of Systems  No rash No vomiting or diarrhea Appetite is fine     Objective:   Physical Exam  Constitutional: She appears well-developed. No distress.  HENT:  Mouth/Throat: Oropharynx is clear and moist. No oropharyngeal exudate.  ??slight sinus sensitivity Mild nasal congestion TMs normal  Neck: Normal range of motion. Neck supple. No thyromegaly present.  Pulmonary/Chest:  Slightly decreased breath sounds Expiratory rhonchi but not wheezing or tight  Lymphadenopathy:    She has no cervical adenopathy.          Assessment & Plan:

## 2014-07-12 NOTE — Assessment & Plan Note (Signed)
No exacerbation at this point Continue regular meds

## 2014-07-12 NOTE — Assessment & Plan Note (Signed)
Likely just viral Discussed symptomatic care If worsens, fill the amoxil (not allergic, just GI problems with augmentin)

## 2014-07-12 NOTE — Patient Instructions (Signed)
Please only start the amoxicillin antibiotic if you are worsening in the next few days.

## 2014-07-31 ENCOUNTER — Telehealth: Payer: Self-pay | Admitting: Family Medicine

## 2014-07-31 NOTE — Telephone Encounter (Signed)
Pt is going to [place] in 2 weeks and want st o know if there are any vaccines that she needs Please call at home number to advise, thanks.

## 2014-08-01 ENCOUNTER — Ambulatory Visit (INDEPENDENT_AMBULATORY_CARE_PROVIDER_SITE_OTHER): Payer: Medicare Other | Admitting: *Deleted

## 2014-08-01 DIAGNOSIS — Z23 Encounter for immunization: Secondary | ICD-10-CM

## 2014-08-01 MED ORDER — TYPHOID VACCINE PO CPDR: 1.0000 | DELAYED_RELEASE_CAPSULE | ORAL | Status: DC

## 2014-08-01 NOTE — Telephone Encounter (Signed)
Patient notified and scheduled nurse visit. She would like typhoid vaccine sent into [facility].

## 2014-08-01 NOTE — Telephone Encounter (Signed)
Would want to ensure she's had hep A/B shots - we don't have record of this.  Could consider oral typhoid vaccine - let us know if she'd like this and I will prescribe. Otherwise should be all set. Take mosquito repellant and use daily along with long sleeves when able.

## 2014-08-01 NOTE — Telephone Encounter (Signed)
ok not to take if too expensive.

## 2014-08-01 NOTE — Telephone Encounter (Signed)
Patient notified

## 2014-08-01 NOTE — Telephone Encounter (Signed)
Sent in. Take 4 doses QOD 2 wks prior to trip so start now

## 2014-08-01 NOTE — Telephone Encounter (Signed)
Patient stated that The typhoid medication was $73 and she can't afford them, is there another alternative or do she have to take them, please advise

## 2014-08-28 ENCOUNTER — Telehealth: Payer: Self-pay | Admitting: Family Medicine

## 2014-08-28 NOTE — Telephone Encounter (Signed)
[facility]     Patient Name: Desiree Floyd Initial Comment Caller States thinks she might have been exposed to the ziko virus. was in [place] and one of the workers had the virus. she is feeling fine, but thinks she should come in to get blood work done.   DOB: August 22, 1932      Nurse Assessment  Nurse: Venetia Maxon, RN, Manuela Schwartz Date/Time (Eastern Time): 08/28/2014 3:56:41 PM  Confirm and document reason for call. If symptomatic, describe symptoms. ---Caller States thinks she might have been exposed to the ziko virus. was in [place] and one of the workers had the virus. she is feeling fine, but thinks she should come in to get blood work done. 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 Caller denies any rash, headache., fever joint pain or aches.  Has the patient traveled out of the country within the last 30 days? ---No  Does the patient require triage? ---No  Please document clinical information provided and list any resource used. ---RN used the [email or web address].[email or web address]/. Caller verbalized understanding    Guidelines     Guideline Title Affirmed Question Affirmed Notes        Final Disposition User   Clinical Call Slaughters, RN, Manuela Schwartz

## 2014-08-28 NOTE — Telephone Encounter (Signed)
I checked the guidelines.  In the absence of symptoms, and since she isn't pregnant or going to be pregnant, she wouldn't need testing at this point- it wouldn't change what was done.  If she has fever, aches, rash, or other sx, then would need eval and possible labs.  Thanks.

## 2014-08-29 NOTE — Telephone Encounter (Signed)
Left detailed message on voicemail.  Patient is scheduled on 08/30/14 for a Hep A/B injection.  Note made to inform patient of this information at that time.

## 2014-08-29 NOTE — Telephone Encounter (Signed)
Left message on patient's voicemail to return call

## 2014-08-30 ENCOUNTER — Ambulatory Visit (INDEPENDENT_AMBULATORY_CARE_PROVIDER_SITE_OTHER): Payer: Medicare Other | Admitting: *Deleted

## 2014-08-30 DIAGNOSIS — Z23 Encounter for immunization: Secondary | ICD-10-CM | POA: Diagnosis not present

## 2014-08-30 NOTE — Telephone Encounter (Signed)
Patient advised by [place].

## 2014-08-30 NOTE — Telephone Encounter (Signed)
I advise pt of Dr. Josefine Class recommendations/comments

## 2014-09-09 ENCOUNTER — Other Ambulatory Visit: Payer: Self-pay | Admitting: Family Medicine

## 2014-10-18 ENCOUNTER — Ambulatory Visit (INDEPENDENT_AMBULATORY_CARE_PROVIDER_SITE_OTHER)
Admission: RE | Admit: 2014-10-18 | Discharge: 2014-10-18 | Disposition: A | Discharge status: Home / Self Care | Payer: Medicare Other | Source: Ambulatory Visit | Attending: Family Medicine | Admitting: Family Medicine

## 2014-10-18 ENCOUNTER — Encounter: Payer: Self-pay | Admitting: Family Medicine

## 2014-10-18 ENCOUNTER — Ambulatory Visit (INDEPENDENT_AMBULATORY_CARE_PROVIDER_SITE_OTHER): Payer: Medicare Other | Admitting: Family Medicine

## 2014-10-18 VITALS — BP 130/78 | HR 68 | Temp 98.1°F | Wt 118.2 lb

## 2014-10-18 DIAGNOSIS — F17200 Nicotine dependence, unspecified, uncomplicated: Secondary | ICD-10-CM

## 2014-10-18 DIAGNOSIS — E118 Type 2 diabetes mellitus with unspecified complications: Secondary | ICD-10-CM

## 2014-10-18 DIAGNOSIS — R634 Abnormal weight loss: Secondary | ICD-10-CM | POA: Diagnosis not present

## 2014-10-18 DIAGNOSIS — J449 Chronic obstructive pulmonary disease, unspecified: Secondary | ICD-10-CM | POA: Diagnosis not present

## 2014-10-18 DIAGNOSIS — M810 Age-related osteoporosis without current pathological fracture: Secondary | ICD-10-CM | POA: Diagnosis not present

## 2014-10-18 DIAGNOSIS — E559 Vitamin D deficiency, unspecified: Secondary | ICD-10-CM | POA: Insufficient documentation

## 2014-10-18 DIAGNOSIS — I10 Essential (primary) hypertension: Secondary | ICD-10-CM | POA: Diagnosis not present

## 2014-10-18 DIAGNOSIS — Z9889 Other specified postprocedural states: Secondary | ICD-10-CM

## 2014-10-18 DIAGNOSIS — M109 Gout, unspecified: Secondary | ICD-10-CM

## 2014-10-18 DIAGNOSIS — Z720 Tobacco use: Secondary | ICD-10-CM

## 2014-10-18 DIAGNOSIS — E1151 Type 2 diabetes mellitus with diabetic peripheral angiopathy without gangrene: Secondary | ICD-10-CM

## 2014-10-18 DIAGNOSIS — Z859 Personal history of malignant neoplasm, unspecified: Secondary | ICD-10-CM

## 2014-10-18 LAB — CBC WITH DIFFERENTIAL/PLATELET
BASOS PCT: 0.4 % (ref 0.0–3.0)
Basophils Absolute: 0 10*3/uL (ref 0.0–0.1)
EOS PCT: 0.9 % (ref 0.0–5.0)
Eosinophils Absolute: 0.1 10*3/uL (ref 0.0–0.7)
HEMATOCRIT: 46.4 % — AB (ref 36.0–46.0)
HEMOGLOBIN: 15.6 g/dL — AB (ref 12.0–15.0)
LYMPHS PCT: 23.6 % (ref 12.0–46.0)
Lymphs Abs: 2.5 10*3/uL (ref 0.7–4.0)
MCHC: 33.6 g/dL (ref 30.0–36.0)
MCV: 97.5 fl (ref 78.0–100.0)
MONO ABS: 1.2 10*3/uL — AB (ref 0.1–1.0)
Monocytes Relative: 10.7 % (ref 3.0–12.0)
NEUTROS ABS: 6.9 10*3/uL (ref 1.4–7.7)
Neutrophils Relative %: 64.4 % (ref 43.0–77.0)
PLATELETS: 197 10*3/uL (ref 150.0–400.0)
RBC: 4.76 Mil/uL (ref 3.87–5.11)
RDW: 14.1 % (ref 11.5–15.5)
WBC: 10.8 10*3/uL — AB (ref 4.0–10.5)

## 2014-10-18 LAB — URIC ACID: Uric Acid, Serum: 6.4 mg/dL (ref 2.4–7.0)

## 2014-10-18 LAB — BASIC METABOLIC PANEL
BUN: 13 mg/dL (ref 6–23)
CALCIUM: 9.7 mg/dL (ref 8.4–10.5)
CO2: 33 mEq/L — ABNORMAL HIGH (ref 19–32)
Chloride: 98 mEq/L (ref 96–112)
Creatinine, Ser: 0.76 mg/dL (ref 0.40–1.20)
GFR: 77.87 mL/min (ref >60.00)
GLUCOSE: 118 mg/dL — AB (ref 70–99)
POTASSIUM: 4.1 meq/L (ref 3.5–5.1)
Sodium: 139 mEq/L (ref 135–145)

## 2014-10-18 LAB — VITAMIN D 25 HYDROXY (VIT D DEFICIENCY, FRACTURES): VITD: 49.78 ng/mL (ref 30.00–100.00)

## 2014-10-18 LAB — HEMOGLOBIN A1C: Hgb A1c MFr Bld: 6.4 % (ref 4.6–6.5)

## 2014-10-18 NOTE — Assessment & Plan Note (Signed)
Again reviewed with patient - she remains unconcerned. Check CXR and CBC today.

## 2014-10-18 NOTE — Assessment & Plan Note (Signed)
Endorses compliance with spiriva and symbicort

## 2014-10-18 NOTE — Assessment & Plan Note (Signed)
Diet controlled - check A1c today.

## 2014-10-18 NOTE — Assessment & Plan Note (Signed)
Continue to encourage cessation. At 1/2 ppd.

## 2014-10-18 NOTE — Assessment & Plan Note (Signed)
Doing well with calcium, vit D and fosamax weekly.

## 2014-10-18 NOTE — Progress Notes (Addendum)
BP 130/78 mmHg  Pulse 68  Temp(Src) 98.1 F (36.7 C) (Oral)  Wt 118 lb 4 oz (53.638 kg)   CC: 58mo f/u visit  Subjective:    Patient ID: Desiree Floyd, female    DOB: [birth year], 81 y.o.   MRN: [id number]  HPI: Desiree Floyd is a 81 y.o. female presenting on 10/18/2014 for Follow-up   Osteoporosis by recent dexa. Date: 2016 T -2.7 spine, -2.6 hip. Fosamax started 06/2014. Discussed cal/vit D daily. Continues smoking.   COPD - regular with spiriva. Uses symbicort daily as well. Doesn't have albuterol inhaler. Still smoking 1/2 ppd.   DM - diet controlled. Checks sugars intermittently - 140s  Weight loss - down another 5 lbs. Pt not concerned. Feels ok. She did have LN (metastatic SCC unknown primary thought from skin cancer on lip).   Relevant past medical, surgical, family and social history reviewed and updated as indicated. Interim medical history since our last visit reviewed. Allergies and medications reviewed and updated. Current Outpatient Prescriptions on File Prior to Visit  Medication Sig  . acetaminophen (TYLENOL) 500 MG tablet Take 1,000 mg by mouth every 6 (six) hours as needed for moderate pain.  Marland Kitchen alendronate (FOSAMAX) 70 MG tablet Take 1 tablet (70 mg total) by mouth every 7 (seven) days. Take with a full glass of water on an empty stomach.  Marland Kitchen amLODipine (NORVASC) 5 MG tablet Take 1 tablet by mouth  daily  . aspirin 81 MG tablet Take 81 mg by mouth daily.    . budesonide-formoterol (SYMBICORT) 160-4.5 MCG/ACT inhaler Inhale 2 puffs into the lungs daily.  . Calcium Carbonate-Vitamin D (CALCIUM 600/VITAMIN D) 600-400 MG-UNIT per chew tablet Chew 1 tablet by mouth daily.  . cholecalciferol 2000 UNITS TABS Take 2,000 Units by mouth daily.  Marland Kitchen glucose blood (ONE TOUCH ULTRA TEST) test strip Use to check blood sugar once daily and as directed. Dx: E11.9  . metoprolol tartrate (LOPRESSOR) 25 MG tablet Take one-half tablet by  mouth in the morning and in the evening  .  simvastatin (ZOCOR) 20 MG tablet Take 1 tablet by mouth at  bedtime  . SPIRIVA HANDIHALER 18 MCG inhalation capsule Inhale the contents of 1  capsule via HandiHaler  daily  . triamterene-hydrochlorothiazide (MAXZIDE-25) 37.5-25 MG per tablet Take 1 tablet by mouth  daily  . vitamin B-12 (CYANOCOBALAMIN) 1000 MCG tablet Take 500 mcg by mouth daily.    No current facility-administered medications on file prior to visit.    Review of Systems Per HPI unless specifically indicated above     Objective:    BP 130/78 mmHg  Pulse 68  Temp(Src) 98.1 F (36.7 C) (Oral)  Wt 118 lb 4 oz (53.638 kg)  Wt Readings from Last 3 Encounters:  10/18/14 118 lb 4 oz (53.638 kg)  07/12/14 122 lb (55.339 kg)  06/27/14 123 lb 4 oz (55.906 kg)   Body mass index is 21.62 kg/(m^2).   Physical Exam  Constitutional: She appears well-developed and well-nourished. No distress.  HENT:  Mouth/Throat: Oropharynx is clear and moist. No oropharyngeal exudate.  Cardiovascular: Normal rate, regular rhythm, normal heart sounds and intact distal pulses.   No murmur heard. Pulmonary/Chest: Effort normal. No respiratory distress. She has no wheezes. She has no rales.  coarse  Skin: Skin is warm and dry. No rash noted.  Psychiatric: She has a normal mood and affect.  Nursing note and vitals reviewed.  Results for orders placed or performed in visit on  10/18/14  Uric acid  Result Value Ref Range   Uric Acid, Serum 6.4 2.4 - 7.0 mg/dL  Hemoglobin A1c  Result Value Ref Range   Hgb A1c MFr Bld 6.4 4.6 - 6.5 %  Basic metabolic panel  Result Value Ref Range   Sodium 139 135 - 145 mEq/L   Potassium 4.1 3.5 - 5.1 mEq/L   Chloride 98 96 - 112 mEq/L   CO2 33 (H) 19 - 32 mEq/L   Glucose, Bld 118 (H) 70 - 99 mg/dL   BUN 13 6 - 23 mg/dL   Creatinine, Ser 0.76 0.40 - 1.20 mg/dL   Calcium 9.7 8.4 - 10.5 mg/dL   GFR 77.87 >60.00 mL/min  Vit D  25 hydroxy (rtn osteoporosis monitoring)  Result Value Ref Range   VITD 49.78  30.00 - 100.00 ng/mL  CBC with Differential/Platelet  Result Value Ref Range   WBC 10.8 (H) 4.0 - 10.5 K/uL   RBC 4.76 3.87 - 5.11 Mil/uL   Hemoglobin 15.6 (H) 12.0 - 15.0 g/dL   HCT 46.4 (H) 36.0 - 46.0 %   MCV 97.5 78.0 - 100.0 fl   MCHC 33.6 30.0 - 36.0 g/dL   RDW 14.1 11.5 - 15.5 %   Platelets 197.0 150.0 - 400.0 K/uL   Neutrophils Relative % 64.4 43.0 - 77.0 %   Lymphocytes Relative 23.6 12.0 - 46.0 %   Monocytes Relative 10.7 3.0 - 12.0 %   Eosinophils Relative 0.9 0.0 - 5.0 %   Basophils Relative 0.4 0.0 - 3.0 %   Neutro Abs 6.9 1.4 - 7.7 K/uL   Lymphs Abs 2.5 0.7 - 4.0 K/uL   Monocytes Absolute 1.2 (H) 0.1 - 1.0 K/uL   Eosinophils Absolute 0.1 0.0 - 0.7 K/uL   Basophils Absolute 0.0 0.0 - 0.1 K/uL      Assessment & Plan:   Problem List Items Addressed This Visit    Vitamin D deficiency    Recheck vit D after regular supplementation.      Relevant Orders   Vit D  25 hydroxy (rtn osteoporosis monitoring) (Completed)   Smoker    Continue to encourage cessation. At 1/2 ppd.      Osteoporosis - Primary    Doing well with calcium, vit D and fosamax weekly.      Relevant Orders   Vit D  25 hydroxy (rtn osteoporosis monitoring) (Completed)   Loss of weight    Again reviewed with patient - she remains unconcerned. Check CXR and CBC today.      Relevant Orders   CBC with Differential/Platelet (Completed)   DG Chest 2 View (Completed)   History of squamous cell carcinoma excision   Gout    Pt has tapered off allopurinol. No recent flares. Check uric acid today.      Relevant Orders   Uric acid (Completed)   Essential hypertension   Relevant Orders   Basic metabolic panel (Completed)   Diabetic peripheral vascular disease ([place])    H/o abnormal ABI 2014. Pt currently asxs. Continues smoking.      Diabetes mellitus type 2, controlled, with complications ([place])    Diet controlled - check A1c today.      Relevant Orders   Hemoglobin A1c (Completed)   COPD  (chronic obstructive pulmonary disease) ([facility])    Endorses compliance with spiriva and symbicort          Follow up plan: Return in about 6 months (around 04/17/2015), or as needed, for medicare  [place] visit.

## 2014-10-18 NOTE — Progress Notes (Signed)
Pre visit review using our clinic review tool, if applicable. No additional management support is needed unless otherwise documented below in the visit note. 

## 2014-10-18 NOTE — Assessment & Plan Note (Addendum)
Pt has tapered off allopurinol. No recent flares. Check uric acid today.

## 2014-10-18 NOTE — Assessment & Plan Note (Signed)
Recheck vit D after regular supplementation.

## 2014-10-18 NOTE — Patient Instructions (Addendum)
Sign release form for records from Dr [name] up front. You are doing well with fosamax - continue.  Next B12 refill buy 557mcg dose.  Follow up with Dr [name]  Return in 6 months for follow up, sooner if persistent weight loss.

## 2014-10-20 ENCOUNTER — Encounter: Payer: Self-pay | Admitting: Family Medicine

## 2014-10-21 ENCOUNTER — Encounter: Payer: Self-pay | Admitting: *Deleted

## 2014-10-26 ENCOUNTER — Encounter: Payer: Self-pay | Admitting: Family Medicine

## 2014-11-17 NOTE — Assessment & Plan Note (Signed)
H/o abnormal ABI 2014. Pt currently asxs. Continues smoking.

## 2014-12-19 ENCOUNTER — Other Ambulatory Visit: Payer: Self-pay

## 2014-12-19 DIAGNOSIS — Z1231 Encounter for screening mammogram for malignant neoplasm of breast: Secondary | ICD-10-CM

## 2015-01-14 ENCOUNTER — Ambulatory Visit
Admission: RE | Admit: 2015-01-14 | Discharge: 2015-01-14 | Disposition: A | Discharge status: Home / Self Care | Payer: Medicare Other | Source: Ambulatory Visit

## 2015-01-14 DIAGNOSIS — Z1231 Encounter for screening mammogram for malignant neoplasm of breast: Secondary | ICD-10-CM | POA: Diagnosis not present

## 2015-01-14 LAB — HM MAMMOGRAPHY: HM MAMMO: NORMAL

## 2015-01-15 ENCOUNTER — Encounter: Payer: Self-pay | Admitting: *Deleted

## 2015-02-12 ENCOUNTER — Ambulatory Visit (INDEPENDENT_AMBULATORY_CARE_PROVIDER_SITE_OTHER): Payer: Medicare Other | Admitting: Family Medicine

## 2015-02-12 ENCOUNTER — Encounter: Payer: Self-pay | Admitting: Family Medicine

## 2015-02-12 VITALS — BP 110/66 | HR 72 | Temp 98.2°F | Wt 117.8 lb

## 2015-02-12 DIAGNOSIS — Z720 Tobacco use: Secondary | ICD-10-CM | POA: Diagnosis not present

## 2015-02-12 DIAGNOSIS — J9621 Acute and chronic respiratory failure with hypoxia: Secondary | ICD-10-CM | POA: Diagnosis not present

## 2015-02-12 DIAGNOSIS — J441 Chronic obstructive pulmonary disease with (acute) exacerbation: Secondary | ICD-10-CM | POA: Diagnosis not present

## 2015-02-12 DIAGNOSIS — J449 Chronic obstructive pulmonary disease, unspecified: Secondary | ICD-10-CM | POA: Diagnosis not present

## 2015-02-12 DIAGNOSIS — F17200 Nicotine dependence, unspecified, uncomplicated: Secondary | ICD-10-CM

## 2015-02-12 MED ORDER — ALBUTEROL SULFATE HFA 108 (90 BASE) MCG/ACT IN AERS: 2.0000 | INHALATION_SPRAY | Freq: Four times a day (QID) | RESPIRATORY_TRACT | Status: AC | PRN

## 2015-02-12 MED ORDER — ALBUTEROL SULFATE (2.5 MG/3ML) 0.083% IN NEBU
2.5000 mg | INHALATION_SOLUTION | Freq: Once | RESPIRATORY_TRACT | Status: AC
  Administered 2015-02-12: 2.5 mg via RESPIRATORY_TRACT

## 2015-02-12 MED ORDER — DOXYCYCLINE HYCLATE 100 MG PO TABS: 100.0000 mg | ORAL_TABLET | Freq: Two times a day (BID) | ORAL | Status: DC

## 2015-02-12 MED ORDER — PREDNISONE 20 MG PO TABS: ORAL_TABLET | ORAL | Status: DC

## 2015-02-12 MED ORDER — IPRATROPIUM BROMIDE 0.02 % IN SOLN
0.5000 mg | Freq: Once | RESPIRATORY_TRACT | Status: AC
  Administered 2015-02-12: 0.5 mg via RESPIRATORY_TRACT

## 2015-02-12 NOTE — Patient Instructions (Addendum)
You have COPD exacerbation associated with low oxygen level. Treat with doxycycline antibiotic, prednisone course, and home oxygen. May use albuterol inhaler as needed at home (refilled today). We will send a respiratory therapist to your home for oxygen. If not improving as expected, please return to see [place]. If worsening trouble breathing, please seek urgent care.

## 2015-02-12 NOTE — Assessment & Plan Note (Addendum)
Continue to encourage. No cigarette in 6 days. Pt aware of acute and long term health risks of continued smoking.

## 2015-02-12 NOTE — Progress Notes (Signed)
BP 110/66 mmHg  Pulse 72  Temp(Src) 98.2 F (36.8 C) (Oral)  Wt 117 lb 12 oz (53.411 kg)  SpO2 88%   CC: URI  Subjective:    Patient ID: Desiree Floyd, female    DOB: 15-Oct-1932, 82 y.o.   MRN: [id number]  HPI: Desiree Floyd is a 82 y.o. female presenting on 02/12/2015 for URI   1wk h/o chills, aches, dry cough, chest soreness from coughing, weak. Now cough productive of thick yellow mucous, head congestion, worsening dyspnea and wheezing. Very dyspneic with exertion.   Sleeping on couch on incline.   No GI sxs. No fevers.  Husband sick 1 wk ago. Pt and husband smoke.   COPD in continued smoker. No cigarette in last 6 days.   Relevant past medical, surgical, family and social history reviewed and updated as indicated. Interim medical history since our last visit reviewed. Allergies and medications reviewed and updated. Current Outpatient Prescriptions on File Prior to Visit  Medication Sig  . acetaminophen (TYLENOL) 500 MG tablet Take 1,000 mg by mouth every 6 (six) hours as needed for moderate pain.  Marland Kitchen alendronate (FOSAMAX) 70 MG tablet Take 1 tablet (70 mg total) by mouth every 7 (seven) days. Take with a full glass of water on an empty stomach.  Marland Kitchen amLODipine (NORVASC) 5 MG tablet Take 1 tablet by mouth  daily  . aspirin 81 MG tablet Take 81 mg by mouth daily.    . budesonide-formoterol (SYMBICORT) 160-4.5 MCG/ACT inhaler Inhale 2 puffs into the lungs daily.  . Calcium Carbonate-Vitamin D (CALCIUM 600/VITAMIN D) 600-400 MG-UNIT per chew tablet Chew 1 tablet by mouth daily.  . cholecalciferol 2000 UNITS TABS Take 2,000 Units by mouth daily.  Marland Kitchen glucose blood (ONE TOUCH ULTRA TEST) test strip Use to check blood sugar once daily and as directed. Dx: E11.9  . metoprolol tartrate (LOPRESSOR) 25 MG tablet Take one-half tablet by  mouth in the morning and in the evening  . Omega-3 Fatty Acids (FISH OIL) 1000 MG CAPS Take 1,000 mg by mouth daily.  . simvastatin (ZOCOR) 20 MG  tablet Take 1 tablet by mouth at  bedtime  . SPIRIVA HANDIHALER 18 MCG inhalation capsule Inhale the contents of 1  capsule via HandiHaler  daily  . triamterene-hydrochlorothiazide (MAXZIDE-25) 37.5-25 MG per tablet Take 1 tablet by mouth  daily  . vitamin B-12 (CYANOCOBALAMIN) 1000 MCG tablet Take 500 mcg by mouth daily.    No current facility-administered medications on file prior to visit.    Review of Systems Per HPI unless specifically indicated in ROS section     Objective:    BP 110/66 mmHg  Pulse 72  Temp(Src) 98.2 F (36.8 C) (Oral)  Wt 117 lb 12 oz (53.411 kg)  SpO2 88%  Wt Readings from Last 3 Encounters:  02/12/15 117 lb 12 oz (53.411 kg)  10/18/14 118 lb 4 oz (53.638 kg)  07/12/14 122 lb (55.339 kg)    Physical Exam  Constitutional: She appears well-developed and well-nourished. No distress.  HENT:  Head: Normocephalic and atraumatic.  Right Ear: Hearing, tympanic membrane, external ear and ear canal normal.  Left Ear: Hearing, tympanic membrane, external ear and ear canal normal.  Nose: Mucosal edema present. No rhinorrhea. Right sinus exhibits no maxillary sinus tenderness and no frontal sinus tenderness. Left sinus exhibits no maxillary sinus tenderness and no frontal sinus tenderness.  Mouth/Throat: Uvula is midline, oropharynx is clear and moist and mucous membranes are normal. No oropharyngeal exudate,  posterior oropharyngeal edema, posterior oropharyngeal erythema or tonsillar abscesses.  Eyes: Conjunctivae and EOM are normal. Pupils are equal, round, and reactive to light. No scleral icterus.  Neck: Normal range of motion. Neck supple.  Cardiovascular: Normal rate, regular rhythm, normal heart sounds and intact distal pulses.   No murmur heard. Pulmonary/Chest: Effort normal. No respiratory distress. She has decreased breath sounds. She has wheezes (faint diffuse). She has no rhonchi. She has no rales.  Tight air movement throughout, normal work of breathing   Lymphadenopathy:    She has no cervical adenopathy.  Skin: Skin is warm and dry. No rash noted.  Nursing note and vitals reviewed.  Results for orders placed or performed in visit on 01/15/15  HM MAMMOGRAPHY  Result Value Ref Range   HM Mammogram Normal Bir[place] 1-Repeat 1 year    After alb/atrovent neb improved air movement with subjective improvement, no rales or wheezing.  87-88% O2 sat on RA at rest, increases to 91% on 2L [place]    Assessment & Plan:   Problem List Items Addressed This Visit    Smoker    Continue to encourage. No cigarette in 6 days. Pt aware of acute and long term health risks of continued smoking.      Relevant Orders   For home use only DME oxygen   COPD exacerbation ([place]) - Primary    Exam consistent with acute COPD exacerbation in continued smoker.  Treat with prednisone, doxycycline, albuterol. Given hypoxia will also order home oxygen during acute exacerbation. Encouraged remain abstinent from smoking.      Relevant Medications   predniSONE (DELTASONE) 20 MG tablet   albuterol (PROVENTIL) (2.5 MG/3ML) 0.083% nebulizer solution 2.5 mg (Completed)   ipratropium (ATROVENT) nebulizer solution 0.5 mg (Completed)   albuterol (PROVENTIL HFA;VENTOLIN HFA) 108 (90 Base) MCG/ACT inhaler   Other Relevant Orders   For home use only DME oxygen   COPD (chronic obstructive pulmonary disease) ([place])    On triple therapy with symbicort, spiriva.      Relevant Medications   predniSONE (DELTASONE) 20 MG tablet   albuterol (PROVENTIL) (2.5 MG/3ML) 0.083% nebulizer solution 2.5 mg (Completed)   ipratropium (ATROVENT) nebulizer solution 0.5 mg (Completed)   albuterol (PROVENTIL HFA;VENTOLIN HFA) 108 (90 Base) MCG/ACT inhaler   Other Relevant Orders   For home use only DME oxygen    Other Visit Diagnoses    Acute on chronic respiratory failure with hypoxemia ([place])        Relevant Orders    For home use only DME oxygen        Follow up plan: Return if symptoms  worsen or fail to improve.

## 2015-02-12 NOTE — Assessment & Plan Note (Addendum)
Exam consistent with acute COPD exacerbation in continued smoker.  Treat with prednisone, doxycycline, albuterol. Given hypoxia will also order home oxygen during acute exacerbation. Encouraged remain abstinent from smoking.

## 2015-02-12 NOTE — Assessment & Plan Note (Signed)
On triple therapy with symbicort, spiriva.

## 2015-02-12 NOTE — Progress Notes (Signed)
Pre visit review using our clinic review tool, if applicable. No additional management support is needed unless otherwise documented below in the visit note. 

## 2015-02-19 ENCOUNTER — Telehealth: Payer: Self-pay | Admitting: Family Medicine

## 2015-02-19 DIAGNOSIS — J441 Chronic obstructive pulmonary disease with (acute) exacerbation: Secondary | ICD-10-CM | POA: Diagnosis not present

## 2015-02-19 NOTE — Telephone Encounter (Signed)
Patient called and said her oxygen was delivered today.  Patient is asking for Maudie Mercury to call her back about the oxygen.  She's very nervous about how to use it.

## 2015-02-20 ENCOUNTER — Other Ambulatory Visit: Payer: Self-pay | Admitting: *Deleted

## 2015-02-20 MED ORDER — ALENDRONATE SODIUM 70 MG PO TABS: 70.0000 mg | ORAL_TABLET | ORAL | Status: DC

## 2015-02-20 NOTE — Telephone Encounter (Signed)
Spoke with patient and answered her questions.

## 2015-03-04 ENCOUNTER — Ambulatory Visit (INDEPENDENT_AMBULATORY_CARE_PROVIDER_SITE_OTHER): Payer: Medicare Other

## 2015-03-04 DIAGNOSIS — Z23 Encounter for immunization: Secondary | ICD-10-CM | POA: Diagnosis not present

## 2015-03-05 DIAGNOSIS — D225 Melanocytic nevi of trunk: Secondary | ICD-10-CM | POA: Diagnosis not present

## 2015-03-05 DIAGNOSIS — X32XXXD Exposure to sunlight, subsequent encounter: Secondary | ICD-10-CM | POA: Diagnosis not present

## 2015-03-05 DIAGNOSIS — L570 Actinic keratosis: Secondary | ICD-10-CM | POA: Diagnosis not present

## 2015-03-05 DIAGNOSIS — C44722 Squamous cell carcinoma of skin of right lower limb, including hip: Secondary | ICD-10-CM | POA: Diagnosis not present

## 2015-03-11 ENCOUNTER — Telehealth: Payer: Self-pay | Admitting: *Deleted

## 2015-03-11 DIAGNOSIS — J449 Chronic obstructive pulmonary disease, unspecified: Secondary | ICD-10-CM

## 2015-03-11 NOTE — Telephone Encounter (Signed)
Patient left voicemail requesting a prescription for pick up oxygen and supplies.  She received oxygen for overnight use as well as two portable tanks.  She states she needs the have the portable tanks picked up as she is only to be using the oxygen at night while she is asleep.  Please advise.

## 2015-03-12 NOTE — Telephone Encounter (Signed)
Would want to ensure she's maintaining O2 sats >88% with exertion prior to picking up portable tanks - if so ok to pick up portable oxygen tanks, continue nightly oxygen. Ensure no smoking around oxygen.  If breathing back to baseline, would offer appt to review oxygen need and for spirometry.

## 2015-03-13 NOTE — Telephone Encounter (Signed)
Pt request cb about two portable tanks.

## 2015-03-13 NOTE — Telephone Encounter (Signed)
Patient notified and nurse visit scheduled.

## 2015-03-14 ENCOUNTER — Ambulatory Visit (INDEPENDENT_AMBULATORY_CARE_PROVIDER_SITE_OTHER): Payer: Medicare Other | Admitting: *Deleted

## 2015-03-14 DIAGNOSIS — J441 Chronic obstructive pulmonary disease with (acute) exacerbation: Secondary | ICD-10-CM | POA: Diagnosis not present

## 2015-03-14 NOTE — Telephone Encounter (Signed)
Pt came in for nurse visit. O2 sat while resting was 95%, when ambulating it dropped to 90%.

## 2015-03-14 NOTE — Telephone Encounter (Signed)
plz send order to pick up daytime portable oxygen tanks.

## 2015-03-17 NOTE — Telephone Encounter (Signed)
Pt left v/m requesting cb about status of getting Oxygen tanks picked up.

## 2015-03-17 NOTE — Addendum Note (Signed)
Addended by: Royann Shivers A on: 03/17/2015 03:21 PM   Modules accepted: Orders

## 2015-03-17 NOTE — Telephone Encounter (Signed)
Order sent to [facility] °

## 2015-03-18 NOTE — Telephone Encounter (Signed)
Patient notified that [facility] should be contacting her today to pick up tanks.

## 2015-03-22 DIAGNOSIS — J441 Chronic obstructive pulmonary disease with (acute) exacerbation: Secondary | ICD-10-CM | POA: Diagnosis not present

## 2015-03-25 ENCOUNTER — Ambulatory Visit (INDEPENDENT_AMBULATORY_CARE_PROVIDER_SITE_OTHER): Payer: Medicare Other | Admitting: Cardiology

## 2015-03-25 ENCOUNTER — Encounter: Payer: Self-pay | Admitting: Cardiology

## 2015-03-25 VITALS — BP 130/62 | HR 69 | Ht 61.0 in | Wt 117.4 lb

## 2015-03-25 DIAGNOSIS — E785 Hyperlipidemia, unspecified: Secondary | ICD-10-CM

## 2015-03-25 DIAGNOSIS — Z720 Tobacco use: Secondary | ICD-10-CM | POA: Diagnosis not present

## 2015-03-25 DIAGNOSIS — F17200 Nicotine dependence, unspecified, uncomplicated: Secondary | ICD-10-CM

## 2015-03-25 DIAGNOSIS — I2510 Atherosclerotic heart disease of native coronary artery without angina pectoris: Secondary | ICD-10-CM | POA: Diagnosis not present

## 2015-03-25 DIAGNOSIS — I739 Peripheral vascular disease, unspecified: Secondary | ICD-10-CM | POA: Insufficient documentation

## 2015-03-25 DIAGNOSIS — E1151 Type 2 diabetes mellitus with diabetic peripheral angiopathy without gangrene: Secondary | ICD-10-CM

## 2015-03-25 NOTE — Progress Notes (Signed)
Desiree Floyd Date of Birth: 26-Jun-1932   History of Present Illness: Desiree Floyd is seen today for followup CAD.  She has a history of coronary disease with a remote lateral myocardial infarction in 2004 treated with stenting of the left circumflex coronary with a drug-eluting stent. Myoview study in May 2013 was normal. She also has a history of dyslipidemia, diabetes mellitus, hypertension, and tobacco use. She continues to smoke some. She had a COPD exacerbation in January and quit smoking for 13 days then resumed.  She has some SOB and is using inhalers. She is also on oxygen at night.  She does have evidence of peripheral arterial disease with ABIs of 0.96 on the left and 0.81 on the right in the past. I cannot find when this study was done. She has mild claudication symptoms walking uphill.   Current Outpatient Prescriptions on File Prior to Visit  Medication Sig Dispense Refill  . acetaminophen (TYLENOL) 500 MG tablet Take 1,000 mg by mouth every 6 (six) hours as needed for moderate pain.    Marland Kitchen albuterol (PROVENTIL HFA;VENTOLIN HFA) 108 (90 Base) MCG/ACT inhaler Inhale 2 puffs into the lungs every 6 (six) hours as needed for wheezing or shortness of breath. 1 Inhaler 1  . alendronate (FOSAMAX) 70 MG tablet Take 1 tablet (70 mg total) by mouth every 7 (seven) days. Take with a full glass of water on an empty stomach. 12 tablet 3  . amLODipine (NORVASC) 5 MG tablet Take 1 tablet by mouth  daily 90 tablet 3  . aspirin 81 MG tablet Take 81 mg by mouth daily.      . budesonide-formoterol (SYMBICORT) 160-4.5 MCG/ACT inhaler Inhale 2 puffs into the lungs daily. 3 Inhaler 3  . Calcium Carbonate-Vitamin D (CALCIUM 600/VITAMIN D) 600-400 MG-UNIT per chew tablet Chew 1 tablet by mouth daily.    . cholecalciferol 2000 UNITS TABS Take 2,000 Units by mouth daily.    Marland Kitchen glucose blood (ONE TOUCH ULTRA TEST) test strip Use to check blood sugar once daily and as directed. Dx: E11.9 100 each 1  .  metoprolol tartrate (LOPRESSOR) 25 MG tablet Take one-half tablet by  mouth in the morning and in the evening 90 tablet 3  . Omega-3 Fatty Acids (FISH OIL) 1000 MG CAPS Take 1,000 mg by mouth daily.    . simvastatin (ZOCOR) 20 MG tablet Take 1 tablet by mouth at  bedtime 90 tablet 3  . SPIRIVA HANDIHALER 18 MCG inhalation capsule Inhale the contents of 1  capsule via HandiHaler  daily 90 capsule 3  . triamterene-hydrochlorothiazide (MAXZIDE-25) 37.5-25 MG per tablet Take 1 tablet by mouth  daily 90 tablet 1  . vitamin B-12 (CYANOCOBALAMIN) 1000 MCG tablet Take 500 mcg by mouth daily.      No current facility-administered medications on file prior to visit.    Allergies  Allergen Reactions  . Augmentin [Amoxicillin-Pot Clavulanate] Other (See Comments)    Diarrhea and vomiting  . Chantix [Varenicline] Other (See Comments)    Bad dreams  . Lisinopril Cough    Past Medical History  Diagnosis Date  . Hypertension   . Hyperlipidemia   . CAD (coronary artery disease) 2004    s/p stent [[facility] [facility]]  . Smoker     1/2 ppd  . OA (osteoarthritis)     of hands  . CPPD (pseudo-gout)   . COPD (chronic obstructive pulmonary disease) ([place]) 2010    FEV1 39%, FVC 49%, ratio 0.59, not reversible w SABA (  05/2008)  . Asthma   . Allergy   . Urge incontinence 2008    s/p urodynamics [[place]]  . Benign breast lumps     knows breast lumps, told scar tissue from implants  . Gout     Deveshwar/[place]  . PAC (premature atrial contraction)     chronic  . DJD (degenerative joint disease) of lumbar spine   . Exertional shortness of breath   . Mass of left submandibular region 2015    s/p aspiration then excision by ENT - squamous cell ? spread from lip SqCCa rec pt CT 05/2015  . Myocardial infarction [facility])  2004    "right before I had stent put in"   . Diet-controlled diabetes mellitus ([place]) since 2012    diet controlled  . Squamous cell cancer of lip 2014    L lower lip s/p excision  .  Osteoporosis 2016    T -2.7 spine, -2.6 hip    Past Surgical History  Procedure Laterality Date  . Cataract extraction w/ intraocular lens  implant, bilateral Bilateral 2010  . Inner ear surgery Bilateral     "had one surgery on each ear for hearing loss:  prosthesis implanted in right ear; metal piece placed in left ear"  . Coronary angioplasty with stent placement  2004    "1"   . Colonoscopy    . Tonsillectomy  1950's  . Augmentation mammaplasty Bilateral 1970's  . Basal cell carcinoma excision Left 2014    "lower lip"  . Radical neck dissection Left 09/27/2013    Procedure: LEFT SUPRAHYOID DISSECTION;  Surgeon: Melissa Montane, MD - LN with metastatic SCC unknown primary    History  Smoking status  . Current Every Day Smoker -- 0.50 packs/day for 58 years  . Types: Cigarettes  Smokeless tobacco  . Never Used    History  Alcohol Use  . Yes    Comment: 09/27/2013  "maybe a glass of wine twice/yr"    Family History  Problem Relation Age of Onset  . Heart disease Father 14    CAD AND HEART ATTACK  . Other Mother     old age, age 49  . Heart disease Mother     Review of Systems: As noted in history of present illness. All other systems were reviewed and are negative.  Physical Exam: BP 130/62 mmHg  Pulse 69  Ht 5\' 1"  (1.549 m)  Wt 53.252 kg (117 lb 6.4 oz)  BMI 22.19 kg/m2 She is an elderly white female in no acute distress.The patient is alert and oriented x 3.    The skin is warm and dry.  Color is normal.  The HEENT exam is normal.  The carotids are 2+ without bruits.  There is no thyromegaly.  There is no JVD.  Old surgical scar in neck.  The lungs are clear.  The heart exam reveals a regular rate with a normal S1 and S2.  There are no murmurs, gallops, or rubs.  The PMI is not displaced.   Abdominal exam reveals good bowel sounds.  There is no guarding or rebound.  There is no hepatosplenomegaly or tenderness.  There are no masses.  Exam of the legs reveal no  clubbing, cyanosis, or edema.  The distal pulses are palpable. Cranial nerves II - XII are intact.  Motor and sensory functions are intact.  The gait is normal.  LABORATORY DATA: Lab Results  Component Value Date   WBC 10.8* 10/18/2014   HGB 15.6*  10/18/2014   HCT 46.4* 10/18/2014   PLT 197.0 10/18/2014   GLUCOSE 118* 10/18/2014   CHOL 131 04/09/2014   TRIG 138.0 04/09/2014   HDL 45.20 04/09/2014   LDLDIRECT 54.6 12/13/2013   LDLCALC 58 04/09/2014   ALT 23 02/01/2013   AST 28 02/01/2013   NA 139 10/18/2014   K 4.1 10/18/2014   CL 98 10/18/2014   CREATININE 0.76 10/18/2014   BUN 13 10/18/2014   CO2 33* 10/18/2014   TSH 1.57 03/16/2011   HGBA1C 6.4 10/18/2014   MICROALBUR 1.2 04/09/2014   Ecg today shows NSR with PACs. Otherwise normal. I have personally reviewed and interpreted this study.   Assessment / Plan: 1. Coronary disease with remote stenting of the left circumflex coronary in 2004. She remains asymptomatic. Myoview study 2013 was normal. We will continue therapy with aspirin, metoprolol, and amlodipine.  2. Tobacco dependence. Stressed the importance of smoking cessation.  3. Peripheral arterial disease. Stable claudication symptoms without rest pain or ulcers.  Encouraged a regular walking program.  4. Diabetes mellitus type 2.  5. Hyperlipidemia well controlled on simvastatin.  6. Hypertension-controlled.  7. History of CA head and neck s/p radical neck dissection.

## 2015-03-25 NOTE — Patient Instructions (Signed)
Continue your current therapy  Try and quit smoking and walk more.  I will see you in one year.

## 2015-04-02 DIAGNOSIS — L570 Actinic keratosis: Secondary | ICD-10-CM | POA: Diagnosis not present

## 2015-04-02 DIAGNOSIS — X32XXXD Exposure to sunlight, subsequent encounter: Secondary | ICD-10-CM | POA: Diagnosis not present

## 2015-04-02 DIAGNOSIS — Z85828 Personal history of other malignant neoplasm of skin: Secondary | ICD-10-CM | POA: Diagnosis not present

## 2015-04-02 DIAGNOSIS — Z08 Encounter for follow-up examination after completed treatment for malignant neoplasm: Secondary | ICD-10-CM | POA: Diagnosis not present

## 2015-04-07 ENCOUNTER — Other Ambulatory Visit: Payer: Self-pay | Admitting: Family Medicine

## 2015-04-11 ENCOUNTER — Telehealth: Payer: Self-pay

## 2015-04-11 NOTE — Telephone Encounter (Signed)
Contacted patient to schedule AWV. Pt declined at this time due to schedule conflict.

## 2015-04-14 ENCOUNTER — Other Ambulatory Visit: Payer: Self-pay | Admitting: Family Medicine

## 2015-04-14 ENCOUNTER — Other Ambulatory Visit (INDEPENDENT_AMBULATORY_CARE_PROVIDER_SITE_OTHER): Payer: Medicare Other

## 2015-04-14 DIAGNOSIS — E785 Hyperlipidemia, unspecified: Secondary | ICD-10-CM | POA: Diagnosis not present

## 2015-04-14 DIAGNOSIS — I10 Essential (primary) hypertension: Secondary | ICD-10-CM

## 2015-04-14 DIAGNOSIS — E118 Type 2 diabetes mellitus with unspecified complications: Secondary | ICD-10-CM

## 2015-04-14 DIAGNOSIS — E538 Deficiency of other specified B group vitamins: Secondary | ICD-10-CM | POA: Diagnosis not present

## 2015-04-14 DIAGNOSIS — R634 Abnormal weight loss: Secondary | ICD-10-CM

## 2015-04-14 LAB — LIPID PANEL
Cholesterol: 126 mg/dL (ref 0–200)
HDL: 53.8 mg/dL (ref >39.00)
LDL Cholesterol: 52 mg/dL (ref 0–99)
NONHDL: 72.63
TRIGLYCERIDES: 105 mg/dL (ref 0.0–149.0)
Total CHOL/HDL Ratio: 2
VLDL: 21 mg/dL (ref 0.0–40.0)

## 2015-04-14 LAB — BASIC METABOLIC PANEL
BUN: 12 mg/dL (ref 6–23)
CHLORIDE: 100 meq/L (ref 96–112)
CO2: 32 meq/L (ref 19–32)
CREATININE: 0.74 mg/dL (ref 0.40–1.20)
Calcium: 10.1 mg/dL (ref 8.4–10.5)
GFR: 80.2 mL/min (ref >60.00)
GLUCOSE: 123 mg/dL — AB (ref 70–99)
Potassium: 3.9 mEq/L (ref 3.5–5.1)
Sodium: 139 mEq/L (ref 135–145)

## 2015-04-14 LAB — CBC WITH DIFFERENTIAL/PLATELET
BASOS ABS: 0 10*3/uL (ref 0.0–0.1)
Basophils Relative: 0.4 % (ref 0.0–3.0)
Eosinophils Absolute: 0.2 10*3/uL (ref 0.0–0.7)
Eosinophils Relative: 1.4 % (ref 0.0–5.0)
HEMATOCRIT: 45.5 % (ref 36.0–46.0)
Hemoglobin: 15.2 g/dL — ABNORMAL HIGH (ref 12.0–15.0)
LYMPHS PCT: 22.1 % (ref 12.0–46.0)
Lymphs Abs: 2.9 10*3/uL (ref 0.7–4.0)
MCHC: 33.3 g/dL (ref 30.0–36.0)
MCV: 96.2 fl (ref 78.0–100.0)
MONOS PCT: 9.6 % (ref 3.0–12.0)
Monocytes Absolute: 1.3 10*3/uL — ABNORMAL HIGH (ref 0.1–1.0)
NEUTROS ABS: 8.7 10*3/uL — AB (ref 1.4–7.7)
Neutrophils Relative %: 66.5 % (ref 43.0–77.0)
PLATELETS: 211 10*3/uL (ref 150.0–400.0)
RBC: 4.73 Mil/uL (ref 3.87–5.11)
RDW: 14.1 % (ref 11.5–15.5)
WBC: 13.1 10*3/uL — ABNORMAL HIGH (ref 4.0–10.5)

## 2015-04-14 LAB — MICROALBUMIN / CREATININE URINE RATIO
CREATININE, U: 21.3 mg/dL
MICROALB/CREAT RATIO: 3.3 mg/g (ref 0.0–30.0)

## 2015-04-14 LAB — HEMOGLOBIN A1C: Hgb A1c MFr Bld: 6.6 % — ABNORMAL HIGH (ref 4.6–6.5)

## 2015-04-14 LAB — VITAMIN B12: Vitamin B-12: 1027 pg/mL — ABNORMAL HIGH (ref 211–911)

## 2015-04-15 ENCOUNTER — Other Ambulatory Visit: Payer: Medicare Other

## 2015-04-19 DIAGNOSIS — J441 Chronic obstructive pulmonary disease with (acute) exacerbation: Secondary | ICD-10-CM | POA: Diagnosis not present

## 2015-04-21 ENCOUNTER — Other Ambulatory Visit: Payer: Self-pay | Admitting: Family Medicine

## 2015-04-22 ENCOUNTER — Ambulatory Visit (INDEPENDENT_AMBULATORY_CARE_PROVIDER_SITE_OTHER): Payer: Medicare Other | Admitting: Family Medicine

## 2015-04-22 ENCOUNTER — Encounter: Payer: Self-pay | Admitting: Family Medicine

## 2015-04-22 VITALS — BP 124/72 | HR 72 | Temp 97.9°F | Wt 118.0 lb

## 2015-04-22 DIAGNOSIS — E1151 Type 2 diabetes mellitus with diabetic peripheral angiopathy without gangrene: Secondary | ICD-10-CM

## 2015-04-22 DIAGNOSIS — I2510 Atherosclerotic heart disease of native coronary artery without angina pectoris: Secondary | ICD-10-CM

## 2015-04-22 DIAGNOSIS — I739 Peripheral vascular disease, unspecified: Secondary | ICD-10-CM

## 2015-04-22 DIAGNOSIS — E118 Type 2 diabetes mellitus with unspecified complications: Secondary | ICD-10-CM

## 2015-04-22 DIAGNOSIS — J449 Chronic obstructive pulmonary disease, unspecified: Secondary | ICD-10-CM

## 2015-04-22 DIAGNOSIS — E785 Hyperlipidemia, unspecified: Secondary | ICD-10-CM

## 2015-04-22 DIAGNOSIS — F17200 Nicotine dependence, unspecified, uncomplicated: Secondary | ICD-10-CM

## 2015-04-22 DIAGNOSIS — Z7189 Other specified counseling: Secondary | ICD-10-CM

## 2015-04-22 DIAGNOSIS — M810 Age-related osteoporosis without current pathological fracture: Secondary | ICD-10-CM

## 2015-04-22 DIAGNOSIS — I10 Essential (primary) hypertension: Secondary | ICD-10-CM

## 2015-04-22 DIAGNOSIS — Z0000 Encounter for general adult medical examination without abnormal findings: Secondary | ICD-10-CM | POA: Diagnosis not present

## 2015-04-22 NOTE — Assessment & Plan Note (Addendum)
Feels breathing at baseline. Encouraged smoking cessation and continued triple therapy. Continues nocturnal O2 2L use.

## 2015-04-22 NOTE — Patient Instructions (Addendum)
Sign release for records from Dr Allyn Kenner on colonoscopy 2007. Schedule yearly eye exam when you're due. Return as needed or in 3 months for diabetes follow up and prior for labs.  Health Maintenance, Female Adopting a healthy lifestyle and getting preventive care can go a long way to promote health and wellness. Talk with your health care provider about what schedule of regular examinations is right for you. This is a good chance for you to check in with your provider about disease prevention and staying healthy. In between checkups, there are plenty of things you can do on your own. Experts have done a lot of research about which lifestyle changes and preventive measures are most likely to keep you healthy. Ask your health care provider for more information. WEIGHT AND DIET  Eat a healthy diet  Be sure to include plenty of vegetables, fruits, low-fat dairy products, and lean protein.  Do not eat a lot of foods high in solid fats, added sugars, or salt.  Get regular exercise. This is one of the most important things you can do for your health.  Most adults should exercise for at least 150 minutes each week. The exercise should increase your heart rate and make you sweat (moderate-intensity exercise).  Most adults should also do strengthening exercises at least twice a week. This is in addition to the moderate-intensity exercise.  Maintain a healthy weight  Body mass index (BMI) is a measurement that can be used to identify possible weight problems. It estimates body fat based on height and weight. Your health care provider can help determine your BMI and help you achieve or maintain a healthy weight.  For females 67 years of age and older:   A BMI below 18.5 is considered underweight.  A BMI of 18.5 to 24.9 is normal.  A BMI of 25 to 29.9 is considered overweight.  A BMI of 30 and above is considered obese.  Watch levels of cholesterol and blood lipids  You should start having  your blood tested for lipids and cholesterol at 80 years of age, then have this test every 5 years.  You may need to have your cholesterol levels checked more often if:  Your lipid or cholesterol levels are high.  You are older than 80 years of age.  You are at high risk for heart disease.  CANCER SCREENING   Lung Cancer  Lung cancer screening is recommended for adults 61-109 years old who are at high risk for lung cancer because of a history of smoking.  A yearly low-dose CT scan of the lungs is recommended for people who:  Currently smoke.  Have quit within the past 15 years.  Have at least a 30-pack-year history of smoking. A pack year is smoking an average of one pack of cigarettes a day for 1 year.  Yearly screening should continue until it has been 15 years since you quit.  Yearly screening should stop if you develop a health problem that would prevent you from having lung cancer treatment.  Breast Cancer  Practice breast self-awareness. This means understanding how your breasts normally appear and feel.  It also means doing regular breast self-exams. Let your health care provider know about any changes, no matter how small.  If you are in your 20s or 30s, you should have a clinical breast exam (CBE) by a health care provider every 1-3 years as part of a regular health exam.  If you are 25 or older, have a CBE  every year. Also consider having a breast X-ray (mammogram) every year.  If you have a family history of breast cancer, talk to your health care provider about genetic screening.  If you are at high risk for breast cancer, talk to your health care provider about having an MRI and a mammogram every year.  Breast cancer gene (BRCA) assessment is recommended for women who have family members with BRCA-related cancers. BRCA-related cancers include:  Breast.  Ovarian.  Tubal.  Peritoneal cancers.  Results of the assessment will determine the need for genetic  counseling and BRCA1 and BRCA2 testing. Cervical Cancer Your health care provider may recommend that you be screened regularly for cancer of the pelvic organs (ovaries, uterus, and vagina). This screening involves a pelvic examination, including checking for microscopic changes to the surface of your cervix (Pap test). You may be encouraged to have this screening done every 3 years, beginning at age 8.  For women ages 75-65, health care providers may recommend pelvic exams and Pap testing every 3 years, or they may recommend the Pap and pelvic exam, combined with testing for human papilloma virus (HPV), every 5 years. Some types of HPV increase your risk of cervical cancer. Testing for HPV may also be done on women of any age with unclear Pap test results.  Other health care providers may not recommend any screening for nonpregnant women who are considered low risk for pelvic cancer and who do not have symptoms. Ask your health care provider if a screening pelvic exam is right for you.  If you have had past treatment for cervical cancer or a condition that could lead to cancer, you need Pap tests and screening for cancer for at least 20 years after your treatment. If Pap tests have been discontinued, your risk factors (such as having a new sexual partner) need to be reassessed to determine if screening should resume. Some women have medical problems that increase the chance of getting cervical cancer. In these cases, your health care provider may recommend more frequent screening and Pap tests. Colorectal Cancer  This type of cancer can be detected and often prevented.  Routine colorectal cancer screening usually begins at 80 years of age and continues through 80 years of age.  Your health care provider may recommend screening at an earlier age if you have risk factors for colon cancer.  Your health care provider may also recommend using home test kits to check for hidden blood in the stool.  A  small camera at the end of a tube can be used to examine your colon directly (sigmoidoscopy or colonoscopy). This is done to check for the earliest forms of colorectal cancer.  Routine screening usually begins at age 64.  Direct examination of the colon should be repeated every 5-10 years through 80 years of age. However, you may need to be screened more often if early forms of precancerous polyps or small growths are found. Skin Cancer  Check your skin from head to toe regularly.  Tell your health care provider about any new moles or changes in moles, especially if there is a change in a mole's shape or color.  Also tell your health care provider if you have a mole that is larger than the size of a pencil eraser.  Always use sunscreen. Apply sunscreen liberally and repeatedly throughout the day.  Protect yourself by wearing long sleeves, pants, a wide-brimmed hat, and sunglasses whenever you are outside. HEART DISEASE, DIABETES, AND HIGH BLOOD PRESSURE  High blood pressure causes heart disease and increases the risk of stroke. High blood pressure is more likely to develop in:  People who have blood pressure in the high end of the normal range (130-139/85-89 mm Hg).  People who are overweight or obese.  People who are African American.  If you are 51-70 years of age, have your blood pressure checked every 3-5 years. If you are 35 years of age or older, have your blood pressure checked every year. You should have your blood pressure measured twice--once when you are at a hospital or clinic, and once when you are not at a hospital or clinic. Record the average of the two measurements. To check your blood pressure when you are not at a hospital or clinic, you can use:  An automated blood pressure machine at a pharmacy.  A home blood pressure monitor.  If you are between 50 years and 34 years old, ask your health care provider if you should take aspirin to prevent strokes.  Have  regular diabetes screenings. This involves taking a blood sample to check your fasting blood sugar level.  If you are at a normal weight and have a low risk for diabetes, have this test once every three years after 80 years of age.  If you are overweight and have a high risk for diabetes, consider being tested at a younger age or more often. PREVENTING INFECTION  Hepatitis B  If you have a higher risk for hepatitis B, you should be screened for this virus. You are considered at high risk for hepatitis B if:  You were born in a country where hepatitis B is common. Ask your health care provider which countries are considered high risk.  Your parents were born in a high-risk country, and you have not been immunized against hepatitis B (hepatitis B vaccine).  You have HIV or AIDS.  You use needles to inject street drugs.  You live with someone who has hepatitis B.  You have had sex with someone who has hepatitis B.  You get hemodialysis treatment.  You take certain medicines for conditions, including cancer, organ transplantation, and autoimmune conditions. Hepatitis C  Blood testing is recommended for:  Everyone born from [birth year] through 1963.  Anyone with known risk factors for hepatitis C. Sexually transmitted infections (STIs)  You should be screened for sexually transmitted infections (STIs) including gonorrhea and chlamydia if:  You are sexually active and are younger than 79 years of age.  You are older than 80 years of age and your health care provider tells you that you are at risk for this type of infection.  Your sexual activity has changed since you were last screened and you are at an increased risk for chlamydia or gonorrhea. Ask your health care provider if you are at risk.  If you do not have HIV, but are at risk, it may be recommended that you take a prescription medicine daily to prevent HIV infection. This is called pre-exposure prophylaxis (PrEP). You are  considered at risk if:  You are sexually active and do not regularly use condoms or know the HIV status of your partner(s).  You take drugs by injection.  You are sexually active with a partner who has HIV. Talk with your health care provider about whether you are at high risk of being infected with HIV. If you choose to begin PrEP, you should first be tested for HIV. You should then be tested every 3 months for as  long as you are taking PrEP.  PREGNANCY   If you are premenopausal and you may become pregnant, ask your health care provider about preconception counseling.  If you may become pregnant, take 400 to 800 micrograms (mcg) of folic acid every day.  If you want to prevent pregnancy, talk to your health care provider about birth control (contraception). OSTEOPOROSIS AND MENOPAUSE   Osteoporosis is a disease in which the bones lose minerals and strength with aging. This can result in serious bone fractures. Your risk for osteoporosis can be identified using a bone density scan.  If you are 17 years of age or older, or if you are at risk for osteoporosis and fractures, ask your health care provider if you should be screened.  Ask your health care provider whether you should take a calcium or vitamin D supplement to lower your risk for osteoporosis.  Menopause may have certain physical symptoms and risks.  Hormone replacement therapy may reduce some of these symptoms and risks. Talk to your health care provider about whether hormone replacement therapy is right for you.  HOME CARE INSTRUCTIONS   Schedule regular health, dental, and eye exams.  Stay current with your immunizations.   Do not use any tobacco products including cigarettes, chewing tobacco, or electronic cigarettes.  If you are pregnant, do not drink alcohol.  If you are breastfeeding, limit how much and how often you drink alcohol.  Limit alcohol intake to no more than 1 drink per day for nonpregnant women. One  drink equals 12 ounces of beer, 5 ounces of wine, or 1 ounces of hard liquor.  Do not use street drugs.  Do not share needles.  Ask your health care provider for help if you need support or information about quitting drugs.  Tell your health care provider if you often feel depressed.  Tell your health care provider if you have ever been abused or do not feel safe at home.   This information is not intended to replace advice given to you by your health care provider. Make sure you discuss any questions you have with your health care provider.   Document Released: 08/10/2010 Document Revised: 02/15/2014 Document Reviewed: 12/27/2012 Elsevier Interactive Patient Education [employer].

## 2015-04-22 NOTE — Assessment & Plan Note (Signed)
Advanced directives: living will scanned in chart (08/2012). HCPOA are son and daughter. "I don't want to be put on machine". Doesn't want prolonged life support.  

## 2015-04-22 NOTE — Assessment & Plan Note (Signed)
Chronic, stable. Continue current regimen. 

## 2015-04-22 NOTE — Assessment & Plan Note (Signed)
Continue to encourage smoking cessation. 

## 2015-04-22 NOTE — Assessment & Plan Note (Signed)
Stable, asxs. 

## 2015-04-22 NOTE — Assessment & Plan Note (Signed)

## 2015-04-22 NOTE — Addendum Note (Signed)
Addended by: [name] on: 04/22/2015 10:26 AM   Modules accepted: Miquel Dunn

## 2015-04-22 NOTE — Assessment & Plan Note (Signed)
Denies claudication sxs. Continue aspirin and statin

## 2015-04-22 NOTE — Assessment & Plan Note (Signed)
Preventative protocols reviewed and updated unless pt declined. Discussed healthy diet and lifestyle.  

## 2015-04-22 NOTE — Progress Notes (Addendum)
BP 124/72 mmHg  Pulse 72  Temp(Src) 97.9 F (36.6 C) (Oral)  Wt 118 lb (53.524 kg)  SpO2 94%   CC: medicare wellness visit  Subjective:    Patient ID: Desiree Floyd, female    DOB: 1932/04/25, 82 y.o.   MRN: [id number]  HPI: PAIRLEE HERDER is a 82 y.o. female presenting on 04/22/2015 for Annual Exam   Patient with CAD, PAD and history head and neck CA s/p radical neck dissection continued smoker 1/2 ppd.   Requests Rx for nerve pill. Feels anxious intermittently. Worries about things. Discussed normal and abnormal worrying. rec against nerve pill at this time.  Hearing screen - wears hearing aides.Hard of hearing. Vision screen - thinks done by Dr Katy Fitch 2016. No falls in last year.  Mood stable/no anhedonia.  Preventative: Colonoscopy - thinks done 09/2005, normal per patient - will again (3rd time) request records today [facility]). Declines screening today. Mammogram - Birads1 - 01/2015 Well woman exam - aged out of cervical cancer. Declines other GYN screening today. Lung cancer screening - discussed. Aged out DEXA - Osteoporosis Date: 2016 T -2.7 spine, -2.6 hip Flu yearly Td - June 2011  Pneumovax 2009, prevnar 2015, 2016 Shingles shot 2013. Advanced directives: living will scanned in chart (08/2012). HCPOA are son and daughter. "I don't want to be put on machine". Doesn't want prolonged life support.  Seat belt use discussed Sunscreen use discussed. No changing moles on skin.  Decaf coffee Lives with husband and no pets Children nearby Occupation: retired, worked for school in [employer] Activity: no regular exercise, wants to start walking Diet: good water, fruits/vegetables daily  Relevant past medical, surgical, family and social history reviewed and updated as indicated. Interim medical history since our last visit reviewed. Allergies and medications reviewed and updated. Current Outpatient Prescriptions on File Prior to Visit  Medication Sig  .  albuterol (PROVENTIL HFA;VENTOLIN HFA) 108 (90 Base) MCG/ACT inhaler Inhale 2 puffs into the lungs every 6 (six) hours as needed for wheezing or shortness of breath.  Marland Kitchen alendronate (FOSAMAX) 70 MG tablet Take 1 tablet (70 mg total) by mouth every 7 (seven) days. Take with a full glass of water on an empty stomach.  Marland Kitchen amLODipine (NORVASC) 5 MG tablet Take 1 tablet by mouth  daily  . aspirin 81 MG tablet Take 81 mg by mouth daily.    . cholecalciferol 2000 UNITS TABS Take 2,000 Units by mouth daily. (Patient taking differently: Take 1,000 Units by mouth daily. )  . glucose blood (ONE TOUCH ULTRA TEST) test strip Use to check blood sugar once daily and as directed. Dx: E11.9  . metoprolol tartrate (LOPRESSOR) 25 MG tablet Take one-half in the morning and one-half in the evening.  . Omega-3 Fatty Acids (FISH OIL) 1000 MG CAPS Take 1,000 mg by mouth daily.  . simvastatin (ZOCOR) 20 MG tablet Take 1 tablet by mouth at  bedtime  . SPIRIVA HANDIHALER 18 MCG inhalation capsule Inhale the contents of 1  capsule via HandiHaler  daily  . SYMBICORT 160-4.5 MCG/ACT inhaler Use 2 puffs daily  . triamterene-hydrochlorothiazide (MAXZIDE-25) 37.5-25 MG tablet Take 1 tablet by mouth  daily  . acetaminophen (TYLENOL) 500 MG tablet Take 1,000 mg by mouth every 6 (six) hours as needed for moderate pain. Reported on 04/22/2015   No current facility-administered medications on file prior to visit.    Review of Systems  Constitutional: Negative for fever, chills, activity change, appetite change, fatigue and unexpected weight  change.  HENT: Negative for hearing loss.   Eyes: Negative for visual disturbance.  Respiratory: Negative for cough, chest tightness, shortness of breath and wheezing.   Cardiovascular: Negative for chest pain, palpitations and leg swelling.  Gastrointestinal: Negative for nausea, vomiting, abdominal pain, diarrhea, constipation, blood in stool and abdominal distention.  Genitourinary: Negative  for hematuria and difficulty urinating.  Musculoskeletal: Negative for myalgias, arthralgias and neck pain.  Skin: Negative for rash.  Neurological: Negative for dizziness, seizures, syncope and headaches.  Hematological: Negative for adenopathy. Does not bruise/bleed easily.  Psychiatric/Behavioral: Negative for dysphoric mood. The patient is not nervous/anxious.    Per HPI unless specifically indicated in ROS section     Objective:    BP 124/72 mmHg  Pulse 72  Temp(Src) 97.9 F (36.6 C) (Oral)  Wt 118 lb (53.524 kg)  SpO2 94%  Wt Readings from Last 3 Encounters:  04/22/15 118 lb (53.524 kg)  03/25/15 117 lb 6.4 oz (53.252 kg)  02/12/15 117 lb 12 oz (53.411 kg)    Physical Exam  Constitutional: She is oriented to person, place, and time. She appears well-developed and well-nourished. No distress.  HENT:  Head: Normocephalic and atraumatic.  Right Ear: Hearing, tympanic membrane, external ear and ear canal normal.  Left Ear: Hearing, tympanic membrane, external ear and ear canal normal.  Nose: Nose normal.  Mouth/Throat: Uvula is midline, oropharynx is clear and moist and mucous membranes are normal. No oropharyngeal exudate, posterior oropharyngeal edema or posterior oropharyngeal erythema.  Eyes: Conjunctivae and EOM are normal. Pupils are equal, round, and reactive to light. No scleral icterus.  Neck: Normal range of motion. Neck supple. Carotid bruit is not present. No thyromegaly present.  Cardiovascular: Normal rate, regular rhythm and intact distal pulses.   Murmur (2/6 systolic) heard. Pulses:      Radial pulses are 2+ on the right side, and 2+ on the left side.  Pulmonary/Chest: Effort normal and breath sounds normal. No respiratory distress. She has no wheezes. She has no rales.  Coarse throughout  Abdominal: Soft. Bowel sounds are normal. She exhibits no distension and no mass. There is no tenderness. There is no rebound and no guarding.  Musculoskeletal: Normal  range of motion. She exhibits no edema.  Lymphadenopathy:    She has no cervical adenopathy.  Neurological: She is alert and oriented to person, place, and time.  CN grossly intact, station and gait intact Recall 3/3 Calculation 5/5 serial 3s  Skin: Skin is warm and dry. No rash noted.  Psychiatric: She has a normal mood and affect. Her behavior is normal. Judgment and thought content normal.  Nursing note and vitals reviewed.  Results for orders placed or performed in visit on 04/22/15  HM DIABETES EYE EXAM  Result Value Ref Range   HM Diabetic Eye Exam No Retinopathy No Retinopathy      Assessment & Plan:   Problem List Items Addressed This Visit    Smoker    Continue to encourage smoking cessation.      PAD (peripheral artery disease) ([place])   Osteoporosis    Reviewed calcium, vit d and fosamax dosing      Relevant Medications   Calcium Carb-Cholecalciferol (CALCIUM-VITAMIN D) 600-400 MG-UNIT TABS   Medicare annual wellness visit, subsequent - Primary    I have personally reviewed the Medicare Annual Wellness questionnaire and have noted 1. The patient's medical and social history 2. Their use of alcohol, tobacco or illicit drugs 3. Their current medications and supplements 4.  The patient's functional ability including ADL's, fall risks, home safety risks and hearing or visual impairment. Cognitive function has been assessed and addressed as indicated.  5. Diet and physical activity 6. Evidence for depression or mood disorders The patients weight, height, BMI have been recorded in the chart. I have made referrals, counseling and provided education to the patient based on review of the above and I have provided the pt with a written personalized care plan for preventive services. Provider list updated.. See scanned questionairre as needed for further documentation. Reviewed preventative protocols and updated unless pt declined.       HLD (hyperlipidemia)    Chronic,  stable. Continue current regimen.      Health maintenance examination    Preventative protocols reviewed and updated unless pt declined. Discussed healthy diet and lifestyle.       Essential hypertension    Chronic, stable. Continue current regimen.      Diabetic peripheral vascular disease ([place])    Denies claudication sxs. Continue aspirin and statin      Diabetes mellitus type 2, controlled, with complications ([place])    Diet controlled.       COPD (chronic obstructive pulmonary disease) (HCC)    Feels breathing at baseline. Encouraged smoking cessation and continued triple therapy. Continues nocturnal O2 2L use.      CAD (coronary artery disease)    Stable, asxs.      Advanced care planning/counseling discussion    Advanced directives: living will scanned in chart (08/2012). HCPOA are son and daughter. "I don't want to be put on machine". Doesn't want prolonged life support.           Follow up plan: Return in about 3 months (around 07/23/2015), or as needed, for follow up visit.

## 2015-04-22 NOTE — Assessment & Plan Note (Signed)
Reviewed calcium, vit d and fosamax dosing

## 2015-04-22 NOTE — Progress Notes (Signed)
Pre visit review using our clinic review tool, if applicable. No additional management support is needed unless otherwise documented below in the visit note. 

## 2015-04-22 NOTE — Assessment & Plan Note (Signed)
Diet controlled.  

## 2015-04-22 NOTE — Addendum Note (Signed)
Addended by: [name] on: 04/22/2015 10:27 AM   Modules accepted: Miquel Dunn

## 2015-05-04 ENCOUNTER — Encounter: Payer: Self-pay | Admitting: Family Medicine

## 2015-05-20 DIAGNOSIS — J441 Chronic obstructive pulmonary disease with (acute) exacerbation: Secondary | ICD-10-CM | POA: Diagnosis not present

## 2015-06-18 ENCOUNTER — Other Ambulatory Visit: Payer: Self-pay

## 2015-06-18 MED ORDER — ALENDRONATE SODIUM 70 MG PO TABS: 70.0000 mg | ORAL_TABLET | ORAL | Status: DC

## 2015-06-18 NOTE — Telephone Encounter (Signed)
Pt request refill at [facility] be d/c (done spoke with [facility] at [facility]) and rx for [facility]endronate [facility]. Last annual 04/22/15. Pt notified done and voiced understanding.

## 2015-06-19 ENCOUNTER — Other Ambulatory Visit: Payer: Self-pay | Admitting: General Practice

## 2015-06-19 DIAGNOSIS — J441 Chronic obstructive pulmonary disease with (acute) exacerbation: Secondary | ICD-10-CM | POA: Diagnosis not present

## 2015-06-19 MED ORDER — ALENDRONATE SODIUM 70 MG PO TABS
70.0000 mg | ORAL_TABLET | ORAL | Status: DC
Start: 2015-06-19 — End: 2016-03-31

## 2015-07-20 DIAGNOSIS — J441 Chronic obstructive pulmonary disease with (acute) exacerbation: Secondary | ICD-10-CM | POA: Diagnosis not present

## 2015-07-25 ENCOUNTER — Encounter: Payer: Self-pay | Admitting: Family Medicine

## 2015-07-25 ENCOUNTER — Other Ambulatory Visit: Payer: Self-pay | Admitting: Family Medicine

## 2015-07-25 ENCOUNTER — Ambulatory Visit (INDEPENDENT_AMBULATORY_CARE_PROVIDER_SITE_OTHER): Payer: Medicare Other | Admitting: Family Medicine

## 2015-07-25 VITALS — BP 132/76 | HR 72 | Temp 98.0°F | Wt 118.0 lb

## 2015-07-25 DIAGNOSIS — E785 Hyperlipidemia, unspecified: Secondary | ICD-10-CM

## 2015-07-25 DIAGNOSIS — E1151 Type 2 diabetes mellitus with diabetic peripheral angiopathy without gangrene: Secondary | ICD-10-CM

## 2015-07-25 DIAGNOSIS — I739 Peripheral vascular disease, unspecified: Secondary | ICD-10-CM

## 2015-07-25 DIAGNOSIS — F17200 Nicotine dependence, unspecified, uncomplicated: Secondary | ICD-10-CM

## 2015-07-25 DIAGNOSIS — M810 Age-related osteoporosis without current pathological fracture: Secondary | ICD-10-CM

## 2015-07-25 DIAGNOSIS — J449 Chronic obstructive pulmonary disease, unspecified: Secondary | ICD-10-CM | POA: Diagnosis not present

## 2015-07-25 DIAGNOSIS — E118 Type 2 diabetes mellitus with unspecified complications: Secondary | ICD-10-CM

## 2015-07-25 DIAGNOSIS — I2510 Atherosclerotic heart disease of native coronary artery without angina pectoris: Secondary | ICD-10-CM

## 2015-07-25 DIAGNOSIS — I10 Essential (primary) hypertension: Secondary | ICD-10-CM | POA: Diagnosis not present

## 2015-07-25 DIAGNOSIS — Z720 Tobacco use: Secondary | ICD-10-CM | POA: Diagnosis not present

## 2015-07-25 MED ORDER — ALBUTEROL SULFATE (2.5 MG/3ML) 0.083% IN NEBU
2.5000 mg | INHALATION_SOLUTION | Freq: Once | RESPIRATORY_TRACT | Status: AC
  Administered 2015-07-25: 2.5 mg via RESPIRATORY_TRACT

## 2015-07-25 NOTE — Assessment & Plan Note (Signed)
Reviewed fosamax use. Has been on med for 1 year now.

## 2015-07-25 NOTE — Assessment & Plan Note (Addendum)
Stable, asxs. Continue aspirin, statin. 

## 2015-07-25 NOTE — Assessment & Plan Note (Addendum)
Continue triple therapy. Has stopped using nocturnal oxygen (this was started when in acute COPD flare with hypoxia). Doing well overall. Update spirometry (last 2015). Check ambulatory pulse ox today.

## 2015-07-25 NOTE — Patient Instructions (Addendum)
Spirometry repeated today - we will call you with results. Ambulatory pulse ox today.  Continue current medicines. Work on smoking cessation.  Ok to hold on night time oxygen for now.  Return in 4-6 months for follow up visit.

## 2015-07-25 NOTE — Progress Notes (Signed)
Pre visit review using our clinic review tool, if applicable. No additional management support is needed unless otherwise documented below in the visit note. 

## 2015-07-25 NOTE — Assessment & Plan Note (Signed)
Continue to encourage cessation. precontemplative. 

## 2015-07-25 NOTE — Progress Notes (Signed)
BP 132/76 mmHg  Pulse 72  Temp(Src) 98 F (36.7 C) (Oral)  Wt 118 lb (53.524 kg)   CC: 107mo f/u visit  Subjective:    Patient ID: Desiree Floyd, female    DOB: [birth year], 82 y.o.   MRN: [id number]  HPI: Desiree Floyd is a 82 y.o. female presenting on 07/25/2015 for Follow-up   Patient with CAD, PAD, HLD, HTN, DM, and history head and neck CA s/p radical neck dissection, COPD continued smoker 1/2 ppd on nocturnal oxygen.   HLD with known PAD - on simvastatin and fish oil without myalgias.   HTN - Compliant with current antihypertensive regimen of metoprolol 12.5mg  bid, maxzide once daily, and amlodipine 5mg  daily. Does not check blood pressures at home.  No low blood pressure readings or symptoms of dizziness/syncope.  Denies HA, vision changes, CP/tightness, SOB, leg swelling.    DM - doesn't regularly check sugars. Diet controlled.  Osteoporosis - compliant with fosamax weekly, regularly on medicine since 06/2014. Denies new jaw pain. Over last week noticing L lower flank pain worse when standing after prolonged sitting. Quickly resolves. Denies inciting trauma/injury.   COPD - stable. Continues 1/2 ppd. She does take nocturnal oxygen (started 02/2015 due to hypoxia during COPD exacerbation).   Relevant past medical, surgical, family and social history reviewed and updated as indicated. Interim medical history since our last visit reviewed. Allergies and medications reviewed and updated. Current Outpatient Prescriptions on File Prior to Visit  Medication Sig  . acetaminophen (TYLENOL) 500 MG tablet Take 1,000 mg by mouth every 6 (six) hours as needed for moderate pain. Reported on 04/22/2015  . albuterol (PROVENTIL HFA;VENTOLIN HFA) 108 (90 Base) MCG/ACT inhaler Inhale 2 puffs into the lungs every 6 (six) hours as needed for wheezing or shortness of breath.  Marland Kitchen alendronate (FOSAMAX) 70 MG tablet Take 1 tablet (70 mg total) by mouth every 7 (seven) days. Take with a full glass of  water on an empty stomach.  Marland Kitchen amLODipine (NORVASC) 5 MG tablet Take 1 tablet by mouth  daily  . aspirin 81 MG tablet Take 81 mg by mouth daily.    . Calcium Carb-Cholecalciferol (CALCIUM-VITAMIN D) 600-400 MG-UNIT TABS Take 1 tablet by mouth daily.  . cholecalciferol 2000 UNITS TABS Take 2,000 Units by mouth daily. (Patient taking differently: Take 1,000 Units by mouth daily. )  . glucose blood (ONE TOUCH ULTRA TEST) test strip Use to check blood sugar once daily and as directed. Dx: E11.9  . metoprolol tartrate (LOPRESSOR) 25 MG tablet Take one-half in the morning and one-half in the evening.  . Omega-3 Fatty Acids (FISH OIL) 1000 MG CAPS Take 1,000 mg by mouth daily.  . simvastatin (ZOCOR) 20 MG tablet Take 1 tablet by mouth at  bedtime  . SPIRIVA HANDIHALER 18 MCG inhalation capsule Inhale the contents of 1  capsule via HandiHaler  daily  . SYMBICORT 160-4.5 MCG/ACT inhaler Use 2 puffs daily  . triamterene-hydrochlorothiazide (MAXZIDE-25) 37.5-25 MG tablet Take 1 tablet by mouth  daily  . vitamin B-12 (CYANOCOBALAMIN) 500 MCG tablet Take 500 mcg by mouth daily.   No current facility-administered medications on file prior to visit.    Review of Systems Per HPI unless specifically indicated in ROS section     Objective:    BP 132/76 mmHg  Pulse 72  Temp(Src) 98 F (36.7 C) (Oral)  Wt 118 lb (53.524 kg)  Wt Readings from Last 3 Encounters:  07/25/15 118 lb (53.524 kg)  04/22/15 118 lb (53.524 kg)  03/25/15 117 lb 6.4 oz (53.252 kg)    Physical Exam  Constitutional: She appears well-developed and well-nourished. No distress.  HENT:  Mouth/Throat: Oropharynx is clear and moist. No oropharyngeal exudate.  Cardiovascular: Normal rate, regular rhythm and intact distal pulses.   Murmur (3/6 systolic) heard. Pulmonary/Chest: Effort normal and breath sounds normal. No respiratory distress. She has no wheezes. She has no rales.  coarse  Musculoskeletal: She exhibits no edema.  Skin:  Skin is warm and dry.  Nursing note and vitals reviewed.  Results for orders placed or performed in visit on 05/04/15  HM COLONOSCOPY  Result Value Ref Range   HM Colonoscopy WNL       Assessment & Plan:   Problem List Items Addressed This Visit    HLD (hyperlipidemia)    Chronic, stable. Continue simvastatin and fish oil.      Smoker    Continue to encourage cessation. precontemplative.      Relevant Orders   Spirometry with Graph (Completed)   Essential hypertension    Chronic, stable. Continue current regimen.       COPD (chronic obstructive pulmonary disease) ([place]) - Primary    Continue triple therapy. Has stopped using nocturnal oxygen (this was started when in acute COPD flare with hypoxia). Doing well overall. Update spirometry (last 2015). Check ambulatory pulse ox today.       Relevant Medications   albuterol (PROVENTIL) (2.5 MG/3ML) 0.083% nebulizer solution 2.5 mg (Completed)   Diabetes mellitus type 2, controlled, with complications ([place])    Diet controlled. A1c stable last reading.       CAD (coronary artery disease)    Stable, asxs. Continue aspirin, statin.      Diabetic peripheral vascular disease ([place])   Osteoporosis    Reviewed fosamax use. Has been on med for 1 year now.       PAD (peripheral artery disease) ([place])       Follow up plan: Return in about 4 months (around 11/24/2015) for follow up visit.  [name], MD

## 2015-07-25 NOTE — Assessment & Plan Note (Signed)
Chronic, stable. Continue current regimen. 

## 2015-07-25 NOTE — Assessment & Plan Note (Signed)
Diet controlled. A1c stable last reading.

## 2015-07-25 NOTE — Assessment & Plan Note (Signed)
Chronic, stable. Continue simvastatin and fish oil.

## 2015-07-28 ENCOUNTER — Encounter: Payer: Self-pay | Admitting: *Deleted

## 2015-07-29 ENCOUNTER — Encounter: Payer: Self-pay | Admitting: Internal Medicine

## 2015-07-29 ENCOUNTER — Ambulatory Visit (INDEPENDENT_AMBULATORY_CARE_PROVIDER_SITE_OTHER): Payer: Medicare Other | Admitting: Internal Medicine

## 2015-07-29 VITALS — BP 136/70 | HR 72 | Temp 97.9°F | Wt 116.8 lb

## 2015-07-29 DIAGNOSIS — L03311 Cellulitis of abdominal wall: Secondary | ICD-10-CM | POA: Diagnosis not present

## 2015-07-29 DIAGNOSIS — L0390 Cellulitis, unspecified: Secondary | ICD-10-CM | POA: Insufficient documentation

## 2015-07-29 MED ORDER — DOXYCYCLINE HYCLATE 100 MG PO TABS: 100.0000 mg | ORAL_TABLET | Freq: Two times a day (BID) | ORAL | Status: DC

## 2015-07-29 NOTE — Progress Notes (Signed)
Subjective:    Patient ID: Desiree Floyd, female    DOB: [birth year], 82 y.o.   MRN: [id number]  HPI Here due to concern from several tick bites  Had 2 ticks she removed 5 days ago Stuck and husband had to remove them with tweezers May have been on as long as 2 days Abdomen and right flank Now with redness and hard area  No fever No rash No headaches No chills or systemic symptoms  Tried alcohol last night  Current Outpatient Prescriptions on File Prior to Visit  Medication Sig Dispense Refill  . acetaminophen (TYLENOL) 500 MG tablet Take 1,000 mg by mouth every 6 (six) hours as needed for moderate pain. Reported on 04/22/2015    . albuterol (PROVENTIL HFA;VENTOLIN HFA) 108 (90 Base) MCG/ACT inhaler Inhale 2 puffs into the lungs every 6 (six) hours as needed for wheezing or shortness of breath. 1 Inhaler 1  . alendronate (FOSAMAX) 70 MG tablet Take 1 tablet (70 mg total) by mouth every 7 (seven) days. Take with a full glass of water on an empty stomach. 12 tablet 3  . amLODipine (NORVASC) 5 MG tablet Take 1 tablet by mouth  daily 90 tablet 2  . aspirin 81 MG tablet Take 81 mg by mouth daily.      . Calcium Carb-Cholecalciferol (CALCIUM-VITAMIN D) 600-400 MG-UNIT TABS Take 1 tablet by mouth daily.    . cholecalciferol 2000 UNITS TABS Take 2,000 Units by mouth daily. (Patient taking differently: Take 1,000 Units by mouth daily. )    . glucose blood (ONE TOUCH ULTRA TEST) test strip Use to check blood sugar once daily and as directed. Dx: E11.9 100 each 1  . metoprolol tartrate (LOPRESSOR) 25 MG tablet Take one-half in the morning and one-half in the evening. 90 tablet 2  . Omega-3 Fatty Acids (FISH OIL) 1000 MG CAPS Take 1,000 mg by mouth daily.    . simvastatin (ZOCOR) 20 MG tablet Take 1 tablet by mouth at  bedtime 90 tablet 2  . SPIRIVA HANDIHALER 18 MCG inhalation capsule Inhale the contents of 1  capsule via HandiHaler  daily 90 capsule 1  . SYMBICORT 160-4.5 MCG/ACT inhaler  Use 2 puffs daily 20.4 g 6  . triamterene-hydrochlorothiazide (MAXZIDE-25) 37.5-25 MG tablet Take 1 tablet by mouth  daily 90 tablet 2  . vitamin B-12 (CYANOCOBALAMIN) 500 MCG tablet Take 500 mcg by mouth daily.     No current facility-administered medications on file prior to visit.    Allergies  Allergen Reactions  . Augmentin [Amoxicillin-Pot Clavulanate] Other (See Comments)    Diarrhea and vomiting  . Chantix [Varenicline] Other (See Comments)    Bad dreams  . Lisinopril Cough    Past Medical History  Diagnosis Date  . Hypertension   . Hyperlipidemia   . CAD (coronary artery disease) 2004    s/p stent [[place]]  . Smoker     1/2 ppd  . OA (osteoarthritis)     of hands  . CPPD (pseudo-gout)   . COPD (chronic obstructive pulmonary disease) ([place]) 2010    FVC 49%, FEV1 39%, ratio 0.59, not reversible w SABA (05/2008)  . Asthma   . Allergy   . Urge incontinence 2008    s/p urodynamics [[place]]  . Benign breast lumps     knows breast lumps, told scar tissue from implants  . Gout     Deveshwar/[place]  . PAC (premature atrial contraction)     chronic  . DJD (degenerative  joint disease) of lumbar spine   . Exertional shortness of breath   . Mass of left submandibular region 2015    s/p aspiration then excision by ENT - squamous cell ? spread from lip SqCCa rec pt CT 05/2015  . Myocardial infarction [facility])  2004    "right before I had stent put in"   . Diet-controlled diabetes mellitus ([place]) since 2012    diet controlled  . Squamous cell cancer of lip 2014    L lower lip s/p excision  . Osteoporosis 2016    T -2.7 spine, -2.6 hip  . BREAST MASS, BENIGN 03/12/2010    Past Surgical History  Procedure Laterality Date  . Cataract extraction w/ intraocular lens  implant, bilateral Bilateral 2010  . Inner ear surgery Bilateral     "had one surgery on each ear for hearing loss:  prosthesis implanted in right ear; metal piece placed in left ear"  . Coronary angioplasty with  stent placement  2004    "1"   . Colonoscopy  09/2005    tortuous colon, hem o/w WNL ([place])  . Tonsillectomy  1950's  . Augmentation mammaplasty Bilateral 1970's  . Basal cell carcinoma excision Left 2014    "lower lip"  . Radical neck dissection Left 09/27/2013    Procedure: LEFT SUPRAHYOID DISSECTION;  Surgeon: Melissa Montane, MD - LN with metastatic SCC unknown primary    Family History  Problem Relation Age of Onset  . Heart disease Father 63    CAD AND HEART ATTACK  . Other Mother     old age, age 75  . Heart disease Mother     Social History   Social History  . Marital Status: Married    Spouse Name: N/A  . Number of Children: 2  . Years of Education: N/A   Occupational History  . [employer]    Social History Main Topics  . Smoking status: Current Every Day Smoker -- 0.50 packs/day for 58 years    Types: Cigarettes  . Smokeless tobacco: Never Used  . Alcohol Use: Yes     Comment: 09/27/2013  "maybe a glass of wine twice/yr"  . Drug Use: No  . Sexual Activity: No   Other Topics Concern  . Not on file   Social History Narrative   Decaf coffee   Lives with husband and no pets   Children nearby   Occupation: retired, worked for school in [employer]   Activity: no regular exercise, wants to start walking   Diet: good water, fruits/vegetables daily   Review of Systems  Appetite is fine Sleeping okay     Objective:   Physical Exam  Skin:  Tick bite sites on mid abdomen and right back/flank both have induration and 2-3cm of surrounding redness and warmth Small non inflamed nodule at site of tick bite at left ankle          Assessment & Plan:

## 2015-07-29 NOTE — Patient Instructions (Signed)
Take the antibiotic at least 5 days---if it is much better after 2-3 days, stop after 5. Otherwise, take the whole thing.

## 2015-07-29 NOTE — Assessment & Plan Note (Signed)
At sites of 2 tick bites No evidence of tick borne disease at all Discussed warm compresses Doxy for the infection

## 2015-07-29 NOTE — Progress Notes (Signed)
Pre visit review using our clinic review tool, if applicable. No additional management support is needed unless otherwise documented below in the visit note. 

## 2015-08-19 DIAGNOSIS — J441 Chronic obstructive pulmonary disease with (acute) exacerbation: Secondary | ICD-10-CM | POA: Diagnosis not present

## 2015-09-19 DIAGNOSIS — J441 Chronic obstructive pulmonary disease with (acute) exacerbation: Secondary | ICD-10-CM | POA: Diagnosis not present

## 2015-10-02 ENCOUNTER — Other Ambulatory Visit: Payer: Self-pay | Admitting: Family Medicine

## 2015-10-20 DIAGNOSIS — J441 Chronic obstructive pulmonary disease with (acute) exacerbation: Secondary | ICD-10-CM | POA: Diagnosis not present

## 2015-10-30 ENCOUNTER — Other Ambulatory Visit: Payer: Self-pay | Admitting: Family Medicine

## 2015-11-10 ENCOUNTER — Encounter (HOSPITAL_COMMUNITY): Payer: Self-pay

## 2015-11-10 ENCOUNTER — Telehealth: Payer: Self-pay | Admitting: Family Medicine

## 2015-11-10 ENCOUNTER — Emergency Department (HOSPITAL_COMMUNITY): Payer: Medicare Other

## 2015-11-10 ENCOUNTER — Observation Stay (HOSPITAL_COMMUNITY)
Admission: EM | Admit: 2015-11-10 | Discharge: 2015-11-12 | Disposition: A | Discharge status: Home / Self Care | Payer: Medicare Other | Attending: Family Medicine | Admitting: Family Medicine

## 2015-11-10 DIAGNOSIS — Z7982 Long term (current) use of aspirin: Secondary | ICD-10-CM | POA: Diagnosis not present

## 2015-11-10 DIAGNOSIS — J9601 Acute respiratory failure with hypoxia: Secondary | ICD-10-CM | POA: Diagnosis not present

## 2015-11-10 DIAGNOSIS — R059 Cough, unspecified: Secondary | ICD-10-CM

## 2015-11-10 DIAGNOSIS — J189 Pneumonia, unspecified organism: Secondary | ICD-10-CM | POA: Diagnosis not present

## 2015-11-10 DIAGNOSIS — R0602 Shortness of breath: Secondary | ICD-10-CM | POA: Diagnosis not present

## 2015-11-10 DIAGNOSIS — J441 Chronic obstructive pulmonary disease with (acute) exacerbation: Secondary | ICD-10-CM | POA: Diagnosis not present

## 2015-11-10 DIAGNOSIS — I2510 Atherosclerotic heart disease of native coronary artery without angina pectoris: Secondary | ICD-10-CM | POA: Diagnosis not present

## 2015-11-10 DIAGNOSIS — I491 Atrial premature depolarization: Secondary | ICD-10-CM | POA: Diagnosis not present

## 2015-11-10 DIAGNOSIS — J449 Chronic obstructive pulmonary disease, unspecified: Secondary | ICD-10-CM | POA: Diagnosis present

## 2015-11-10 DIAGNOSIS — M109 Gout, unspecified: Secondary | ICD-10-CM | POA: Diagnosis present

## 2015-11-10 DIAGNOSIS — E876 Hypokalemia: Secondary | ICD-10-CM | POA: Insufficient documentation

## 2015-11-10 DIAGNOSIS — I10 Essential (primary) hypertension: Secondary | ICD-10-CM | POA: Diagnosis not present

## 2015-11-10 DIAGNOSIS — F17210 Nicotine dependence, cigarettes, uncomplicated: Secondary | ICD-10-CM | POA: Diagnosis not present

## 2015-11-10 DIAGNOSIS — E871 Hypo-osmolality and hyponatremia: Secondary | ICD-10-CM | POA: Insufficient documentation

## 2015-11-10 DIAGNOSIS — I252 Old myocardial infarction: Secondary | ICD-10-CM | POA: Insufficient documentation

## 2015-11-10 DIAGNOSIS — Z7983 Long term (current) use of bisphosphonates: Secondary | ICD-10-CM | POA: Insufficient documentation

## 2015-11-10 DIAGNOSIS — Z79899 Other long term (current) drug therapy: Secondary | ICD-10-CM | POA: Insufficient documentation

## 2015-11-10 DIAGNOSIS — M19041 Primary osteoarthritis, right hand: Secondary | ICD-10-CM | POA: Insufficient documentation

## 2015-11-10 DIAGNOSIS — Z955 Presence of coronary angioplasty implant and graft: Secondary | ICD-10-CM | POA: Diagnosis not present

## 2015-11-10 DIAGNOSIS — J122 Parainfluenza virus pneumonia: Secondary | ICD-10-CM | POA: Insufficient documentation

## 2015-11-10 DIAGNOSIS — M19042 Primary osteoarthritis, left hand: Secondary | ICD-10-CM | POA: Insufficient documentation

## 2015-11-10 DIAGNOSIS — Z8249 Family history of ischemic heart disease and other diseases of the circulatory system: Secondary | ICD-10-CM | POA: Insufficient documentation

## 2015-11-10 DIAGNOSIS — E1151 Type 2 diabetes mellitus with diabetic peripheral angiopathy without gangrene: Secondary | ICD-10-CM | POA: Insufficient documentation

## 2015-11-10 DIAGNOSIS — R05 Cough: Secondary | ICD-10-CM

## 2015-11-10 DIAGNOSIS — E785 Hyperlipidemia, unspecified: Secondary | ICD-10-CM | POA: Diagnosis not present

## 2015-11-10 DIAGNOSIS — E860 Dehydration: Secondary | ICD-10-CM | POA: Insufficient documentation

## 2015-11-10 DIAGNOSIS — Z7951 Long term (current) use of inhaled steroids: Secondary | ICD-10-CM | POA: Insufficient documentation

## 2015-11-10 DIAGNOSIS — E118 Type 2 diabetes mellitus with unspecified complications: Secondary | ICD-10-CM | POA: Diagnosis not present

## 2015-11-10 DIAGNOSIS — J181 Lobar pneumonia, unspecified organism: Secondary | ICD-10-CM | POA: Diagnosis not present

## 2015-11-10 HISTORY — DX: Pneumonia, unspecified organism: J18.9

## 2015-11-10 HISTORY — DX: Dependence on supplemental oxygen: Z99.81

## 2015-11-10 LAB — STREP PNEUMONIAE URINARY ANTIGEN: Strep Pneumo Urinary Antigen: NEGATIVE

## 2015-11-10 LAB — CBC
HEMATOCRIT: 42 % (ref 36.0–46.0)
HEMOGLOBIN: 14 g/dL (ref 12.0–15.0)
MCH: 31 pg (ref 26.0–34.0)
MCHC: 33.3 g/dL (ref 30.0–36.0)
MCV: 92.9 fL (ref 78.0–100.0)
Platelets: 192 10*3/uL (ref 150–400)
RBC: 4.52 MIL/uL (ref 3.87–5.11)
RDW: 12.7 % (ref 11.5–15.5)
WBC: 8 10*3/uL (ref 4.0–10.5)

## 2015-11-10 LAB — I-STAT TROPONIN, ED: Troponin i, poc: 0.01 ng/mL (ref 0.00–0.08)

## 2015-11-10 LAB — BASIC METABOLIC PANEL
ANION GAP: 11 (ref 5–15)
BUN: 9 mg/dL (ref 6–20)
CO2: 32 mmol/L (ref 22–32)
Calcium: 9.3 mg/dL (ref 8.9–10.3)
Chloride: 89 mmol/L — ABNORMAL LOW (ref 101–111)
Creatinine, Ser: 0.87 mg/dL (ref 0.44–1.00)
GFR calc Af Amer: >60 mL/min (ref >60)
GLUCOSE: 165 mg/dL — AB (ref 65–99)
POTASSIUM: 3.4 mmol/L — AB (ref 3.5–5.1)
Sodium: 132 mmol/L — ABNORMAL LOW (ref 135–145)

## 2015-11-10 LAB — GLUCOSE, CAPILLARY
Glucose-Capillary: 258 mg/dL — ABNORMAL HIGH (ref 65–99)
Glucose-Capillary: 171 mg/dL — ABNORMAL HIGH (ref 65–99)

## 2015-11-10 MED ORDER — DEXTROSE 5 % IV SOLN
1.0000 g | Freq: Once | INTRAVENOUS | Status: DC
  Filled 2015-11-10: qty 10

## 2015-11-10 MED ORDER — DEXTROSE 5 % IV SOLN
500.0000 mg | Freq: Once | INTRAVENOUS | Status: DC
Start: 2015-11-10 — End: 2015-11-10
  Administered 2015-11-10: 500 mg via INTRAVENOUS
  Filled 2015-11-10: qty 500

## 2015-11-10 MED ORDER — PREDNISONE 20 MG PO TABS
60.0000 mg | ORAL_TABLET | Freq: Once | ORAL | Status: AC
  Administered 2015-11-10: 60 mg via ORAL
  Filled 2015-11-10: qty 3

## 2015-11-10 MED ORDER — ALBUTEROL (5 MG/ML) CONTINUOUS INHALATION SOLN
10.0000 mg/h | INHALATION_SOLUTION | Freq: Once | RESPIRATORY_TRACT | Status: AC
  Administered 2015-11-10: 10 mg/h via RESPIRATORY_TRACT
  Filled 2015-11-10: qty 20

## 2015-11-10 MED ORDER — IPRATROPIUM BROMIDE 0.02 % IN SOLN
1.0000 mg | Freq: Once | RESPIRATORY_TRACT | Status: AC
  Administered 2015-11-10: 1 mg via RESPIRATORY_TRACT
  Filled 2015-11-10: qty 5

## 2015-11-10 MED ORDER — TRIAMTERENE-HCTZ 37.5-25 MG PO TABS
1.0000 | ORAL_TABLET | Freq: Every day | ORAL | Status: DC
  Filled 2015-11-10: qty 1

## 2015-11-10 MED ORDER — SIMVASTATIN 20 MG PO TABS
20.0000 mg | ORAL_TABLET | Freq: Every day | ORAL | Status: DC
  Administered 2015-11-10 – 2015-11-11 (×2): 20 mg via ORAL
  Filled 2015-11-10 (×2): qty 1

## 2015-11-10 MED ORDER — DEXTROSE 5 % IV SOLN
500.0000 mg | INTRAVENOUS | Status: DC
  Administered 2015-11-10 – 2015-11-11 (×3): 500 mg via INTRAVENOUS
  Filled 2015-11-10 (×2): qty 500

## 2015-11-10 MED ORDER — ALBUTEROL SULFATE (2.5 MG/3ML) 0.083% IN NEBU
5.0000 mg | INHALATION_SOLUTION | Freq: Once | RESPIRATORY_TRACT | Status: AC
  Administered 2015-11-10: 5 mg via RESPIRATORY_TRACT

## 2015-11-10 MED ORDER — ALBUTEROL SULFATE (2.5 MG/3ML) 0.083% IN NEBU
INHALATION_SOLUTION | RESPIRATORY_TRACT | Status: AC
  Filled 2015-11-10: qty 3

## 2015-11-10 MED ORDER — ALBUTEROL SULFATE (2.5 MG/3ML) 0.083% IN NEBU
INHALATION_SOLUTION | RESPIRATORY_TRACT | Status: AC
  Filled 2015-11-10: qty 6

## 2015-11-10 MED ORDER — ALBUTEROL SULFATE (2.5 MG/3ML) 0.083% IN NEBU: 2.5000 mg | INHALATION_SOLUTION | RESPIRATORY_TRACT | Status: DC | PRN

## 2015-11-10 MED ORDER — SODIUM CHLORIDE 0.9 % IV BOLUS (SEPSIS)
1000.0000 mL | Freq: Once | INTRAVENOUS | Status: AC
  Administered 2015-11-10: 1000 mL via INTRAVENOUS

## 2015-11-10 MED ORDER — IPRATROPIUM-ALBUTEROL 0.5-2.5 (3) MG/3ML IN SOLN: 3.0000 mL | RESPIRATORY_TRACT | Status: DC | PRN

## 2015-11-10 MED ORDER — GUAIFENESIN ER 600 MG PO TB12
600.0000 mg | ORAL_TABLET | Freq: Two times a day (BID) | ORAL | Status: DC
  Administered 2015-11-10 – 2015-11-12 (×4): 600 mg via ORAL
  Filled 2015-11-10 (×4): qty 1

## 2015-11-10 MED ORDER — DEXTROSE 5 % IV SOLN
1.0000 g | INTRAVENOUS | Status: DC
  Administered 2015-11-10: 1 g via INTRAVENOUS

## 2015-11-10 MED ORDER — TIOTROPIUM BROMIDE MONOHYDRATE 18 MCG IN CAPS
18.0000 ug | ORAL_CAPSULE | Freq: Every day | RESPIRATORY_TRACT | Status: DC
  Administered 2015-11-10: 18 ug via RESPIRATORY_TRACT
  Filled 2015-11-10: qty 5

## 2015-11-10 MED ORDER — SODIUM CHLORIDE 0.9 % IV SOLN
INTRAVENOUS | Status: DC
Start: 2015-11-10 — End: 2015-11-11
  Administered 2015-11-10 – 2015-11-11 (×2): via INTRAVENOUS

## 2015-11-10 MED ORDER — INSULIN ASPART 100 UNIT/ML ~~LOC~~ SOLN
0.0000 [IU] | Freq: Three times a day (TID) | SUBCUTANEOUS | Status: DC
  Administered 2015-11-10: 5 [IU] via SUBCUTANEOUS
  Administered 2015-11-11: 1 [IU] via SUBCUTANEOUS
  Administered 2015-11-11: 5 [IU] via SUBCUTANEOUS
  Administered 2015-11-12: 1 [IU] via SUBCUTANEOUS

## 2015-11-10 MED ORDER — METOPROLOL TARTRATE 25 MG PO TABS
25.0000 mg | ORAL_TABLET | Freq: Two times a day (BID) | ORAL | Status: DC
  Filled 2015-11-10: qty 1

## 2015-11-10 MED ORDER — AMLODIPINE BESYLATE 5 MG PO TABS: 5.0000 mg | ORAL_TABLET | Freq: Every day | ORAL | Status: DC

## 2015-11-10 MED ORDER — ASPIRIN EC 81 MG PO TBEC
81.0000 mg | DELAYED_RELEASE_TABLET | Freq: Every day | ORAL | Status: DC
  Administered 2015-11-11 – 2015-11-12 (×2): 81 mg via ORAL
  Filled 2015-11-10 (×2): qty 1

## 2015-11-10 MED ORDER — ENOXAPARIN SODIUM 40 MG/0.4ML ~~LOC~~ SOLN
40.0000 mg | SUBCUTANEOUS | Status: DC
  Administered 2015-11-10 – 2015-11-11 (×2): 40 mg via SUBCUTANEOUS
  Filled 2015-11-10 (×2): qty 0.4

## 2015-11-10 NOTE — Telephone Encounter (Signed)
Patient Name: Desiree Floyd DOB: [birth year] Initial Comment Caller states aching all over, short of breath Nurse Assessment Nurse: Ronnald Ramp, RN, [place] Date/Time (Eastern Time): 11/10/2015 9:05:38 AM Confirm and document reason for call. If symptomatic, describe symptoms. You must click the next button to save text entered. ---Caller states she has a dry cough, SOB, and body aches for 1 week. Has the patient traveled out of the country within the last 30 days? ---No Does the patient have any new or worsening symptoms? ---Yes Will a triage be completed? ---Yes Related visit to physician within the last 2 weeks? ---No Does the PT have any chronic conditions? (i.e. diabetes, asthma, etc.) ---Yes List chronic conditions. ---COPD, O2 Is this a behavioral health or substance abuse call? ---No Guidelines Guideline Title Affirmed Question Affirmed Notes Asthma Attack [1] Hospitalized before with asthma AND [2] feels the same now Final Disposition User Go to ED Now Ronnald Ramp, RN, [facility] - ED Disagree/Comply: Comply

## 2015-11-10 NOTE — ED Notes (Signed)
Spoke to lab regarding blood cults draws. Attempted to draw enough blood from IV access but unable to obtain enough blood for blood cults. Lab aware and will obtain blood cults. IV antix will be initiated post blood cult draw.

## 2015-11-10 NOTE — ED Triage Notes (Signed)
Per Pt, Pt is coming from home with complaints of SOB that started last Saturday. Pt reports worsening. Hx of COPD and is not fully compliant with inhalers. Reports using inhalers and oxygen with no relief.

## 2015-11-10 NOTE — ED Notes (Signed)
Spoke to MD d/t hx of COPD, okay to have patient's O2 sats 90 or above.

## 2015-11-10 NOTE — H&P (Signed)
History and Physical    Desiree Floyd [id number] DOB: [birth year] DOA: 11/10/2015   PCP: [name], MD   Patient coming from:  Home   Chief Complaint: Shortness of breath and cough   HPI: Desiree Floyd is a 82 y.o. female with medical history significant for COPD, tobacco abuse, among other problems listed below, presenting to the ED with 1 week history of progressive dyspnea and cough, malaise, and chills. She did not seek medical attention in the interim. Denies rhinorrhea or hemoptysis. Denies fevers  night sweats or mucositis.  Denies any chest pain, chest wall pain or palpitations. Denies any abdominal pain. Has decreased appetite due to symptoms. Denies nausea or vomiting. Denies lower extremity swelling. No confusion was reported. Denies any vision changes,double vision or headaches.Denies any sick contacts or recent long distance travels.   ED Course:  BP 123/60   Pulse 87   Temp 98.2 F (36.8 C) (Oral)   Resp 24   Ht 5\' 2"  (1.575 m)   Wt 53.5 kg (118 lb)   SpO2 92%   BMI 21.58 kg/m    CXR Right upper lobe pneumonia WBC 8 Hb 14, normal platelets  Received albuterol x2 with some symptom relief  Received Zithromax x 1  Review of Systems: As per HPI otherwise 10 point review of systems negative.   Past Medical History:  Diagnosis Date  . Allergy   . Asthma   . Benign breast lumps    knows breast lumps, told scar tissue from implants  . BREAST MASS, BENIGN 03/12/2010  . CAD (coronary artery disease) 2004   s/p stent [[place]]  . COPD (chronic obstructive pulmonary disease) ([place]) 2010   FVC 49%, FEV1 39%, ratio 0.59, not reversible w SABA (05/2008)  . CPPD (pseudo-gout)   . Diet-controlled diabetes mellitus ([place]) since 2012   diet controlled  . DJD (degenerative joint disease) of lumbar spine   . Exertional shortness of breath   . Gout    Deveshwar/Landau  . Hyperlipidemia   . Hypertension   . Mass of left submandibular region 2015   s/p aspiration  then excision by ENT - squamous cell ? spread from lip SqCCa rec pt CT 05/2015  . Myocardial infarction  2004   "right before I had stent put in"   . OA (osteoarthritis)    of hands  . Osteoporosis 2016   T -2.7 spine, -2.6 hip  . PAC (premature atrial contraction)    chronic  . Smoker    1/2 ppd  . Squamous cell cancer of lip 2014   L lower lip s/p excision  . Urge incontinence 2008   s/p urodynamics [Dahlstedt]    Past Surgical History:  Procedure Laterality Date  . AUGMENTATION MAMMAPLASTY Bilateral 1970's  . BASAL CELL CARCINOMA EXCISION Left 2014   "lower lip"  . CATARACT EXTRACTION W/ INTRAOCULAR LENS  IMPLANT, BILATERAL Bilateral 2010  . COLONOSCOPY  09/2005   tortuous colon, hem o/w WNL ([facility])  . CORONARY ANGIOPLASTY WITH STENT PLACEMENT  2004   "1"   . INNER EAR SURGERY Bilateral    "had one surgery on each ear for hearing loss:  prosthesis implanted in right ear; metal piece placed in left ear"  . RADICAL NECK DISSECTION Left 09/27/2013   Procedure: LEFT SUPRAHYOID DISSECTION;  Surgeon: Melissa Montane, MD - LN with metastatic SCC unknown primary  . TONSILLECTOMY  1950's    Social History Social History   Social History  . Marital status:  Married    Spouse name: N/A  . Number of children: 2  . Years of education: N/A   Occupational History  . [place] Retired   Social History Main Topics  . Smoking status: Current Every Day Smoker    Packs/day: 0.50    Years: 58.00    Types: Cigarettes  . Smokeless tobacco: Never Used  . Alcohol use Yes     Comment: 09/27/2013  "maybe a glass of wine twice/yr"  . Drug use: No  . Sexual activity: No   Other Topics Concern  . Not on file   Social History Narrative   Decaf coffee   Lives with husband and no pets   Children nearby   Occupation: retired, worked for school in cafeteria   Activity: no regular exercise, wants to start walking   Diet: good water, fruits/vegetables daily      Allergies  Allergen Reactions  . Augmentin [Amoxicillin-Pot Clavulanate] Other (See Comments)    Diarrhea and vomiting  . Chantix [Varenicline] Other (See Comments)    Bad dreams  . Lisinopril Cough    Family History  Problem Relation Age of Onset  . Heart disease Father 55    CAD AND HEART ATTACK  . Other Mother     old age, age 26  . Heart disease Mother       Prior to Admission medications   Medication Sig Start Date End Date Taking? Authorizing Provider  albuterol (PROVENTIL HFA;VENTOLIN HFA) 108 (90 Base) MCG/ACT inhaler Inhale 2 puffs into the lungs every 6 (six) hours as needed for wheezing or shortness of breath. 02/12/15  Yes [name], MD  alendronate (FOSAMAX) 70 MG tablet Take 1 tablet (70 mg total) by mouth every 7 (seven) days. Take with a full glass of water on an empty stomach. Patient taking differently: Take 70 mg by mouth every Friday. Take with a full glass of water on an empty stomach. 06/19/15  Yes [name], MD  amLODipine (NORVASC) 5 MG tablet Take 1 tablet by mouth  daily Patient taking differently: Take 1/2 tablet by mouth daily (2.5mg ) 10/30/15  Yes [name], MD  aspirin 81 MG tablet Take 81 mg by mouth daily.     Yes Historical Provider, MD  Calcium Carb-Cholecalciferol (CALCIUM-VITAMIN D) 600-400 MG-UNIT TABS Take 1 tablet by mouth daily.   Yes Historical Provider, MD  Cholecalciferol (D3-1000) 1000 units tablet Take 1,000 Units by mouth daily.   Yes Historical Provider, MD  Cholecalciferol 1000 units tablet Take 1,000 Units by mouth daily.   Yes Historical Provider, MD  ibuprofen (ADVIL,MOTRIN) 400 MG tablet Take 400 mg by mouth every 6 (six) hours as needed for moderate pain.   Yes Historical Provider, MD  metoprolol tartrate (LOPRESSOR) 25 MG tablet TAKE ONE-HALF TABLET  IN  THE MORNING AND ONE-HALF  TABLET IN THE EVENING. 10/30/15  Yes [name], MD  Omega-3 Fatty Acids (FISH OIL) 1000 MG CPDR Take 1 capsule by mouth  daily.   Yes Historical Provider, MD  simvastatin (ZOCOR) 20 MG tablet Take 1 tablet by mouth at  bedtime 10/30/15  Yes [name], MD  [facility] HANDIHALER 18 MCG inhalation capsule Inhale the contents of 1  capsule via HandiHaler  daily 10/02/15  Yes [name], MD  [facility] 160-4.5 MCG/ACT inhaler Use 2 puffs daily 07/25/15  Yes [name], MD  triamterene-hydrochlorothiazide [facility]) 37.5-25 MG tablet Take 1 tablet by mouth  daily 10/30/15  Yes [name], MD  vitamin B-12 (CYANOCOBALAMIN) 1000 MCG tablet Take 1,000 mcg by mouth daily.   Yes Historical Provider, MD  acetaminophen (TYLENOL) 500 MG tablet Take 1,000 mg by mouth every 6 (six) hours as needed for moderate pain. Reported on 04/22/2015    Historical Provider, MD  cholecalciferol 2000 UNITS TABS Take 2,000 Units by mouth daily. Patient taking differently: Take 1,000 Units by mouth daily.  06/27/14   [name], MD  doxycycline (VIBRA-TABS) 100 MG tablet Take 1 tablet (100 mg total) by mouth 2 (two) times daily. 07/29/15   Venia Carbon, MD    Physical Exam:    Vitals:   11/10/15 1415 11/10/15 1430 11/10/15 1515 11/10/15 1615  BP: 144/78 142/71 (!) 120/49 123/60  Pulse: 90 104 88 87  Resp: (!) 27 25 16 24   Temp:      TempSrc:      SpO2: 100% 93% 90% 92%  Weight:      Height:           Constitutional: NAD, calm, comfortable  Vitals:   11/10/15 1415 11/10/15 1430 11/10/15 1515 11/10/15 1615  BP: 144/78 142/71 (!) 120/49 123/60  Pulse: 90 104 88 87  Resp: (!) 27 25 16 24   Temp:      TempSrc:      SpO2: 100% 93% 90% 92%  Weight:      Height:       Eyes: PERRL, lids and conjunctivae normal ENMT: Mucous membranes are moist. Posterior pharynx clear of any exudate or lesions.Normal dentition.  Neck: normal, supple, no masses, no thyromegaly Respiratory:bilateral expiratory wheezing and rhonchi , no crackles.Decreased breath sound at the right base  Normal respiratory effort. No accessory  muscle use.  Cardiovascular: Regular rate and rhythm, no murmurs / rubs / gallops. No extremity edema. 2+ pedal pulses. No carotid bruits.  Abdomen: no tenderness, no masses palpated. No hepatosplenomegaly. Bowel sounds positive.  Musculoskeletal: no clubbing / cyanosis. No joint deformity upper and lower extremities. Good ROM, no contractures. Normal muscle tone.  Skin: no rashes, lesions, ulcers.  Neurologic: CN 2-12 grossly intact. Sensation intact, DTR normal. Strength 5/5 in all 4.  Psychiatric: Normal judgment and insight. Alert and oriented x 3. Normal mood.     Labs on Admission: I have personally reviewed following labs and imaging studies  CBC:  Recent Labs Lab 11/10/15 1059  WBC 8.0  HGB 14.0  HCT 42.0  MCV 92.9  PLT [id number]    Basic Metabolic Panel:  Recent Labs Lab 11/10/15 1059  NA 132*  K 3.4*  CL 89*  CO2 32  GLUCOSE 165*  BUN 9  CREATININE 0.87  CALCIUM 9.3    GFR: Estimated Creatinine Clearance: 40.8 mL/min (by C-G formula based on SCr of 0.87 mg/dL).  Liver Function Tests: No results for input(s): AST, ALT, ALKPHOS, BILITOT, PROT, ALBUMIN in the last 168 hours. No results for input(s): LIPASE, AMYLASE in the last 168 hours. No results for input(s): AMMONIA in the last 168 hours.  Coagulation Profile: No results for input(s): INR, PROTIME in the last 168 hours.  Cardiac Enzymes: No results for input(s): CKTOTAL, CKMB, CKMBINDEX, TROPONINI in the last 168 hours.  BNP (last 3 results) No results for input(s): PROBNP in the last 8760 hours.  HbA1C: No results for input(s): HGBA1C in the last 72 hours.  CBG: No results for input(s): GLUCAP in the last 168 hours.  Lipid Profile: No results for input(s): CHOL, HDL, LDLCALC, TRIG, CHOLHDL, LDLDIRECT in the last 72 hours.  Thyroid Function Tests: No results for input(s): TSH, T4TOTAL, FREET4, T3FREE, THYROIDAB in the last 72 hours.  Anemia Panel: No results for input(s): VITAMINB12, FOLATE,  FERRITIN, TIBC, IRON, RETICCTPCT in the last 72 hours.  Urine analysis: No results found for: COLORURINE, APPEARANCEUR, LABSPEC, PHURINE, GLUCOSEU, HGBUR, BILIRUBINUR, KETONESUR, PROTEINUR, UROBILINOGEN, NITRITE, LEUKOCYTESUR  Sepsis Labs: @LABRCNTIP (procalcitonin:4,lacticidven:4) )No results found for this or any previous visit (from the past 240 hour(s)).   Radiological Exams on Admission: Dg Chest 2 View  Result Date: 11/10/2015 CLINICAL DATA:  Cough, shortness of breath EXAM: CHEST  2 VIEW COMPARISON:  10/18/2014 FINDINGS: There is right upper lobe airspace disease concerning for pneumonia. There is no pleural effusion or pneumothorax. The heart and mediastinal contours are unremarkable. The osseous structures are unremarkable. IMPRESSION: Right upper lobe pneumonia. Followup PA and lateral chest X-ray is recommended in 3-4 weeks following trial of antibiotic therapy to ensure resolution and exclude underlying malignancy. Electronically Signed   By: Kathreen Devoid   On: 11/10/2015 13:05    EKG: Independently reviewed.  Assessment/Plan Active Problems:   CAP (community acquired pneumonia)   HLD (hyperlipidemia)   Gout   Essential hypertension   COPD (chronic obstructive pulmonary disease) (HCC)   Diabetes mellitus type 2, controlled, with complications ([place])   CAD (coronary artery disease)   Diabetic peripheral vascular disease (HCC)   Acute Respiratory Failure with Hypoxia likely due to CAP in the setting of COPD .Presenting now with ongoing / progressive symptoms including non productive cough, and chills. Afebrile. VSS. WBC normal. CXR RLL pneumonia   -Will admit to  for evaluation and management of symptoms -Oxygen as needed  Continue Rocephin and Zithromax per Pharmacy  Nebulizers as needed, with duoneb q 4 hrs prn and albuterol q 2 hrs prn MAy resume her COPD inhalers when pneumonia better controlled   -Mucinex as needed  CXR in am  -Repeat CBC in am  Type II Diabetes  diet controlled Current blood sugar level is 165 Lab Results  Component Value Date   HGBA1C 6.6 (H) 04/14/2015  SSI Heart healthy carb modified diet.    Hypertension BP 123/60   Pulse 87   Controlled Continue home anti-hypertensive medications   Hyperlipidemia Continue home statins    DVT prophylaxis: Lovenox  Code Status:   Full    Family Communication:  Discussed with patient wife husband  Disposition Plan: Expect patient to be discharged to home after condition improves Consults called:    None Admission status:Med surg  Obs     Rhys Lichty E, PA-C [facility]   If 7PM-7AM, please contact night-coverage [email or web address] Password [facility]  11/10/2015, 4:39 PM

## 2015-11-10 NOTE — ED Provider Notes (Signed)
[facility] DEPT Provider Note   CSN: [id number] Arrival date & time: 11/10/15  1015     History   Chief Complaint Chief Complaint  Patient presents with  . Shortness of Breath    HPI Desiree Floyd is a 82 y.o. female.  HPI 82 year old female with past medical history of coronary artery disease, COPD, hypertension, hyperlipidemia, who presents with shortness of breath. The patient states her symptoms started week ago with progressively worsening cough and shortness of breath. She denies any associated fever, chills, nausea, or sputum production. Over the last week, she has continued using her daily maintenance inhalers but has not used her albuterol. She called the nurse line today and asked for assistance and advised to present to the ED and also advise her to take 4 puffs of albuterol, which did improve her symptoms while in the waiting room. She also received an albuterol treatment and feels slightly improved, although she endorses persistent shortness of breath. Denies any associated chest pain. Denies any alleviating factors beyond albuterol. Her symptoms are aggravated with any exertion as well as walking outside.  Past Medical History:  Diagnosis Date  . Allergy   . Asthma   . Benign breast lumps    knows breast lumps, told scar tissue from implants  . BREAST MASS, BENIGN 03/12/2010  . CAD (coronary artery disease) 2004   s/p stent [[place]]  . COPD (chronic obstructive pulmonary disease) ([place]) 2010   FVC 49%, FEV1 39%, ratio 0.59, not reversible w SABA (05/2008)  . CPPD (pseudo-gout)   . Diet-controlled diabetes mellitus ([place]) since 2012   diet controlled  . DJD (degenerative joint disease) of lumbar spine   . Exertional shortness of breath   . Gout    Deveshwar/Landau  . Hyperlipidemia   . Hypertension   . Mass of left submandibular region 2015   s/p aspiration then excision by ENT - squamous cell ? spread from lip SqCCa rec pt CT 05/2015  . Myocardial infarction   2004   "right before I had stent put in"   . OA (osteoarthritis)    of hands  . Osteoporosis 2016   T -2.7 spine, -2.6 hip  . PAC (premature atrial contraction)    chronic  . Smoker    1/2 ppd  . Squamous cell cancer of lip 2014   L lower lip s/p excision  . Urge incontinence 2008   s/p urodynamics [Dahlstedt]    Patient Active Problem List   Diagnosis Date Noted  . Cellulitis 07/29/2015  . PAD (peripheral artery disease) ([place]) 03/25/2015  . Vitamin D deficiency 10/18/2014  . Osteoporosis   . Advanced care planning/counseling discussion 04/16/2014  . Health maintenance examination 04/16/2014  . Loss of weight 04/16/2014  . History of squamous cell carcinoma excision 09/27/2013  . COPD exacerbation ([place]) 08/21/2012  . Diabetic peripheral vascular disease ([place]) 06/15/2012  . PAC (premature atrial contraction)   . Medicare annual wellness visit, subsequent 02/24/2012  . Insomnia 03/16/2011  . CAD (coronary artery disease) 11/03/2010  . Diabetes mellitus type 2, controlled, with complications ([place]) [id number]  . HLD (hyperlipidemia) 12/16/2009  . Gout 12/16/2009  . Smoker 12/16/2009  . Essential hypertension 12/16/2009  . COPD (chronic obstructive pulmonary disease) ([place]) 12/16/2009    Past Surgical History:  Procedure Laterality Date  . AUGMENTATION MAMMAPLASTY Bilateral 1970's  . BASAL CELL CARCINOMA EXCISION Left 2014   "lower lip"  . CATARACT EXTRACTION W/ INTRAOCULAR LENS  IMPLANT, BILATERAL Bilateral 2010  . COLONOSCOPY  09/2005   tortuous colon, hem o/w WNL ([facility])  . CORONARY ANGIOPLASTY WITH STENT PLACEMENT  2004   "1"   . INNER EAR SURGERY Bilateral    "had one surgery on each ear for hearing loss:  prosthesis implanted in right ear; metal piece placed in left ear"  . RADICAL NECK DISSECTION Left 09/27/2013   Procedure: LEFT SUPRAHYOID DISSECTION;  Surgeon: Melissa Montane, MD - LN with metastatic SCC unknown primary  . TONSILLECTOMY  1950's    OB History     No data available       Home Medications    Prior to Admission medications   Medication Sig Start Date End Date Taking? Authorizing Provider  acetaminophen (TYLENOL) 500 MG tablet Take 1,000 mg by mouth every 6 (six) hours as needed for moderate pain. Reported on 04/22/2015    Historical Provider, MD  albuterol (PROVENTIL HFA;VENTOLIN HFA) 108 (90 Base) MCG/ACT inhaler Inhale 2 puffs into the lungs every 6 (six) hours as needed for wheezing or shortness of breath. 02/12/15   [name], MD  alendronate (FOSAMAX) 70 MG tablet Take 1 tablet (70 mg total) by mouth every 7 (seven) days. Take with a full glass of water on an empty stomach. 06/19/15   [name], MD  amLODipine (NORVASC) 5 MG tablet Take 1 tablet by mouth  daily 10/30/15   [name], MD  aspirin 81 MG tablet Take 81 mg by mouth daily.      Historical Provider, MD  Calcium Carb-Cholecalciferol (CALCIUM-VITAMIN D) 600-400 MG-UNIT TABS Take 1 tablet by mouth daily.    Historical Provider, MD  cholecalciferol 2000 UNITS TABS Take 2,000 Units by mouth daily. Patient taking differently: Take 1,000 Units by mouth daily.  06/27/14   [name], MD  doxycycline (VIBRA-TABS) 100 MG tablet Take 1 tablet (100 mg total) by mouth 2 (two) times daily. 07/29/15   Venia Carbon, MD  glucose blood (ONE TOUCH ULTRA TEST) test strip Use to check blood sugar once daily and as directed. Dx: E11.9 02/19/14   [name], MD  metoprolol tartrate (LOPRESSOR) 25 MG tablet TAKE ONE-HALF TABLET  IN  THE MORNING AND ONE-HALF  TABLET IN THE EVENING. 10/30/15   [name], MD  Omega-3 Fatty Acids (FISH OIL) 1000 MG CAPS Take 1,000 mg by mouth daily.    Historical Provider, MD  simvastatin (ZOCOR) 20 MG tablet Take 1 tablet by mouth at  bedtime 10/30/15   [name], MD  [facility] HANDIHALER 18 MCG inhalation capsule Inhale the contents of 1  capsule via HandiHaler  daily 10/02/15   [name], MD  [facility] 160-4.5 MCG/ACT  inhaler Use 2 puffs daily 07/25/15   [name], MD  triamterene-hydrochlorothiazide [facility]) 37.5-25 MG tablet Take 1 tablet by mouth  daily 10/30/15   [name], MD  vitamin B-12 (CYANOCOBALAMIN) 500 MCG tablet Take 500 mcg by mouth daily.    Historical Provider, MD    Family History Family History  Problem Relation Age of Onset  . Heart disease Father 83    CAD AND HEART ATTACK  . Other Mother     old age, age 49  . Heart disease Mother     Social History Social History  Substance Use Topics  . Smoking status: Current Every Day Smoker    Packs/day: 0.50    Years: 58.00    Types: Cigarettes  . Smokeless tobacco: Never Used  . Alcohol use Yes     Comment: 09/27/2013  "maybe a  glass of wine twice/yr"     Allergies   Augmentin [amoxicillin-pot clavulanate]; Chantix [varenicline]; and Lisinopril   Review of Systems Review of Systems  Constitutional: Negative for chills, fatigue and fever.  HENT: Negative for congestion and rhinorrhea.   Eyes: Negative for visual disturbance.  Respiratory: Positive for cough, shortness of breath and wheezing.   Cardiovascular: Negative for chest pain and leg swelling.  Gastrointestinal: Negative for abdominal pain, diarrhea, nausea and vomiting.  Genitourinary: Negative for dysuria and flank pain.  Musculoskeletal: Negative for neck pain and neck stiffness.  Skin: Negative for rash and wound.  Allergic/Immunologic: Negative for immunocompromised state.  Neurological: Negative for syncope, weakness and headaches.  All other systems reviewed and are negative.    Physical Exam Updated Vital Signs BP (!) 138/54 (BP Location: Right Arm)   Pulse 89   Temp 98.9 F (37.2 C) (Oral)   Resp 24   Ht 5\' 2"  (1.575 m)   Wt 118 lb (53.5 kg)   SpO2 93%   BMI 21.58 kg/m   Physical Exam  Constitutional: She is oriented to person, place, and time. She appears well-developed and well-nourished. No distress.  HENT:  Head:  Normocephalic and atraumatic.  Eyes: Conjunctivae are normal.  Neck: Neck supple.  Cardiovascular: Normal rate, regular rhythm and normal heart sounds.  Exam reveals no friction rub.   No murmur heard. Pulmonary/Chest: No accessory muscle usage. Tachypnea noted. She is in respiratory distress. She has decreased breath sounds. She has wheezes in the right upper field, the right middle field, the right lower field, the left upper field, the left middle field and the left lower field. She has no rales.  Abdominal: She exhibits no distension.  Musculoskeletal: She exhibits no edema.  Neurological: She is alert and oriented to person, place, and time. She exhibits normal muscle tone.  Skin: Skin is warm. Capillary refill takes less than 2 seconds.  Psychiatric: She has a normal mood and affect.  Nursing note and vitals reviewed.    ED Treatments / Results  Labs (all labs ordered are listed, but only abnormal results are displayed) Labs Reviewed  BASIC METABOLIC PANEL - Abnormal; Notable for the following:       Result Value   Sodium 132 (*)    Potassium 3.4 (*)    Chloride 89 (*)    Glucose, Bld 165 (*)    All other components within normal limits  CBC    EKG  EKG Interpretation  Date/Time:  Monday November 10 2015 10:27:58 EDT Ventricular Rate:  88 PR Interval:  130 QRS Duration: 78 QT Interval:  368 QTC Calculation: 445 R Axis:   -51 Text Interpretation:  Sinus rhythm with Premature supraventricular complexes Left axis deviation Abnormal ECG Baseline artifact No acute ST changes No significant change since last tracing Confirmed by Lisa Milian MD, Lysbeth Galas [phone]) on 11/10/2015 12:04:11 PM       Radiology No results found.  Procedures Procedures (including critical care time)  Medications Ordered in ED Medications  albuterol (PROVENTIL) (2.5 MG/3ML) 0.083% nebulizer solution (not administered)  albuterol (PROVENTIL) (2.5 MG/3ML) 0.083% nebulizer solution (not administered)    albuterol (PROVENTIL) (2.5 MG/3ML) 0.083% nebulizer solution 5 mg (5 mg Nebulization Given 11/10/15 1037)     Initial Impression / Assessment and Plan / ED Course  I have reviewed the triage vital signs and the nursing notes.  Pertinent labs & imaging results that were available during my care of the patient were reviewed by me and considered  in my medical decision making (see chart for details).  Clinical Course    82 year old female with past medical history of COPD who presents with shortness of breath, cough, and wheezing. On arrival, patient is tachypnea, and hypoxic to 89% on room air. Exam shows diffuse wheezing with increased work of breathing. Chest x-ray confirms right upper lobe pneumonia and labs are otherwise unremarkable. She has no hypotension or signs of severe sepsis or systemic infection. Will start on IV Rocephin and azithromycin for community acquired pneumonia, start steroids, and give breathing treatment. Will admit to medicine for further management.  Final Clinical Impressions(s) / ED Diagnoses   Final diagnoses:  Cough  Community acquired pneumonia of right upper lobe of lung ([place])  COPD exacerbation ([place])      Duffy Bruce, MD 11/10/15 1556

## 2015-11-10 NOTE — ED Notes (Signed)
Pt.place back on monitor .while getting a neb .treatment

## 2015-11-10 NOTE — ED Notes (Signed)
Patient went to xray 

## 2015-11-10 NOTE — ED Notes (Signed)
Family at bedside. 

## 2015-11-11 ENCOUNTER — Observation Stay (HOSPITAL_COMMUNITY): Payer: Medicare Other

## 2015-11-11 DIAGNOSIS — J181 Lobar pneumonia, unspecified organism: Secondary | ICD-10-CM

## 2015-11-11 DIAGNOSIS — E118 Type 2 diabetes mellitus with unspecified complications: Secondary | ICD-10-CM

## 2015-11-11 DIAGNOSIS — I10 Essential (primary) hypertension: Secondary | ICD-10-CM

## 2015-11-11 DIAGNOSIS — J441 Chronic obstructive pulmonary disease with (acute) exacerbation: Secondary | ICD-10-CM

## 2015-11-11 DIAGNOSIS — E784 Other hyperlipidemia: Secondary | ICD-10-CM

## 2015-11-11 DIAGNOSIS — J189 Pneumonia, unspecified organism: Secondary | ICD-10-CM

## 2015-11-11 DIAGNOSIS — R918 Other nonspecific abnormal finding of lung field: Secondary | ICD-10-CM | POA: Diagnosis not present

## 2015-11-11 LAB — COMPREHENSIVE METABOLIC PANEL
ALBUMIN: 2.6 g/dL — AB (ref 3.5–5.0)
ALT: 13 U/L — ABNORMAL LOW (ref 14–54)
AST: 24 U/L (ref 15–41)
Alkaline Phosphatase: 43 U/L (ref 38–126)
Anion gap: 11 (ref 5–15)
BILIRUBIN TOTAL: 0.6 mg/dL (ref 0.3–1.2)
BUN: 10 mg/dL (ref 6–20)
CHLORIDE: 98 mmol/L — AB (ref 101–111)
CO2: 29 mmol/L (ref 22–32)
Calcium: 8.6 mg/dL — ABNORMAL LOW (ref 8.9–10.3)
Creatinine, Ser: 0.82 mg/dL (ref 0.44–1.00)
GFR calc Af Amer: >60 mL/min (ref >60)
GFR calc non Af Amer: >60 mL/min (ref >60)
GLUCOSE: 125 mg/dL — AB (ref 65–99)
POTASSIUM: 3 mmol/L — AB (ref 3.5–5.1)
SODIUM: 138 mmol/L (ref 135–145)
Total Protein: 6.3 g/dL — ABNORMAL LOW (ref 6.5–8.1)

## 2015-11-11 LAB — URINE CULTURE: Culture: <10000 — AB

## 2015-11-11 LAB — GLUCOSE, CAPILLARY
GLUCOSE-CAPILLARY: 88 mg/dL (ref 65–99)
Glucose-Capillary: 126 mg/dL — ABNORMAL HIGH (ref 65–99)
Glucose-Capillary: 267 mg/dL — ABNORMAL HIGH (ref 65–99)
Glucose-Capillary: 152 mg/dL — ABNORMAL HIGH (ref 65–99)

## 2015-11-11 LAB — CBC
HEMATOCRIT: 38.1 % (ref 36.0–46.0)
Hemoglobin: 12.5 g/dL (ref 12.0–15.0)
MCH: 30.2 pg (ref 26.0–34.0)
MCHC: 32.8 g/dL (ref 30.0–36.0)
MCV: 92 fL (ref 78.0–100.0)
Platelets: 182 10*3/uL (ref 150–400)
RBC: 4.14 MIL/uL (ref 3.87–5.11)
RDW: 12.6 % (ref 11.5–15.5)
WBC: 4.9 10*3/uL (ref 4.0–10.5)

## 2015-11-11 LAB — PROCALCITONIN: Procalcitonin: <0.1 ng/mL

## 2015-11-11 LAB — HEMOGLOBIN A1C
HEMOGLOBIN A1C: 6.7 % — AB (ref 4.8–5.6)
MEAN PLASMA GLUCOSE: 146 mg/dL

## 2015-11-11 MED ORDER — SODIUM CHLORIDE 0.9 % IV SOLN
INTRAVENOUS | Status: AC
  Administered 2015-11-11: 09:00:00 via INTRAVENOUS
  Filled 2015-11-11: qty 1000

## 2015-11-11 MED ORDER — METOPROLOL TARTRATE 12.5 MG HALF TABLET
12.5000 mg | ORAL_TABLET | Freq: Two times a day (BID) | ORAL | Status: DC
  Administered 2015-11-11 – 2015-11-12 (×3): 12.5 mg via ORAL
  Filled 2015-11-11 (×3): qty 1

## 2015-11-11 MED ORDER — POTASSIUM CHLORIDE CRYS ER 20 MEQ PO TBCR
40.0000 meq | EXTENDED_RELEASE_TABLET | Freq: Once | ORAL | Status: AC
  Administered 2015-11-11: 40 meq via ORAL
  Filled 2015-11-11: qty 2

## 2015-11-11 MED ORDER — PREDNISONE 50 MG PO TABS
50.0000 mg | ORAL_TABLET | Freq: Every day | ORAL | Status: DC
  Administered 2015-11-11 – 2015-11-12 (×2): 50 mg via ORAL
  Filled 2015-11-11 (×2): qty 1

## 2015-11-11 MED ORDER — IPRATROPIUM-ALBUTEROL 0.5-2.5 (3) MG/3ML IN SOLN
3.0000 mL | Freq: Four times a day (QID) | RESPIRATORY_TRACT | Status: DC
  Administered 2015-11-11 – 2015-11-12 (×6): 3 mL via RESPIRATORY_TRACT
  Filled 2015-11-11 (×6): qty 3

## 2015-11-11 NOTE — Care Management Note (Signed)
Case Management Note  Patient Details  Name: Desiree Floyd MRN: [id number] Date of Birth: 25-Apr-1932  Subjective/Objective:    Admitted with PNA, from home with husand . Pt states independent with ADL's,and no usage of DME..  PCP: [name]  Action/Plan: Return to home when medically stable. CM tof/u with disposition needs.  Expected Discharge Date:                  Expected Discharge Plan:  Home/Self Care  In-House Referral:     Discharge planning Services  CM Consult  Post Acute Care Choice:    Choice offered to:     DME Arranged:    DME Agency:     HH Arranged:    HH Agency:     Status of Service:  In process, will continue to follow  If discussed at Long Length of Stay Meetings, dates discussed:    Additional Comments:  Sharin Mons, RN 11/11/2015, 2:57 PM

## 2015-11-11 NOTE — Progress Notes (Signed)
PROGRESS NOTE  Desiree Floyd [id number] DOB: 04/02/1932 DOA: 11/10/2015 PCP: [name], MD  Brief History:  82 year old female with a history of COPD, CAD, squamous cell cancer left, hypertension, hyperlipidemia presented with one-week history of increasing shortness breath, cough, malaise and subjective chills. Patient has also had decreased oral intake for the past week.  Unfortunately, she continues to smoke one half pack per day. She has had a nearly 60-pack-year history. In addition, the patient states that she has only been using her Symbicort once daily and not using her Spiriva as directed. Upon presentation, the patient was noted to be afebrile and hemodynamically stable with saturations of 96-97 percent on room air. Chest x-ray revealed possible right upper lobe opacity. The patient was admitted for intravenous antibiotics and treatment of possible pneumonia.  Assessment/Plan: COPD exacerbation -This may have been incited from a recent viral respiratory infection -Prednisone 50 mg daily -Bronchodilators every 6 -Check viral respiratory panel  Pulmonary opacity -Not convinced that the patient has pneumonia clinically -She has no leukocytosis, no fever, no worsening hypoxemia -Viral respiratory panel -check procalcitonin  Dehydration/hyponatremia -Continue IV fluids -BMP in the morning  Hypokalemia -Replete -Check magnesium  Diabetes mellitus type 2 -Diet controlled in the outpatient setting -Anticipate increased CBGs secondary to prednisone -11/10/2015 hemoglobin A1c 6.7  Tobacco abuse -Tobacco cessation discussed  Coronary artery disease with history of stent -No chest pain presently -personally reviewed the EKG--sinus rhythm, nonspecific T-wave change -Continue aspirin and statin -Continue metoprolol tartrate  Hypertension -Controlled -Continue metoprolol tartrate -Hold amlodipine secondary to soft blood pressure -Discontinue  triamterene HCTZ secondary to hyponatremia and dehydration       Disposition Plan:   Home 11/12/15 if stable Family Communication:   Husband updated at bedside--Total time spent 35 minutes.  Greater than 50% spent face to face counseling and coordinating care.  Consultants:  none  Code Status:  FULL  DVT Prophylaxis:   [place] Lovenox   Procedures: As Listed in Progress Note Above  Antibiotics: None    Subjective: Patient complains of nonproductive cough and shortness of breath with exertion. Denies any fevers, chest pain, nausea, vomiting, diarrhea, abdominal pain. No dysuria or hematuria. No rashes.  Objective: Vitals:   11/10/15 2056 11/10/15 2058 11/11/15 0501 11/11/15 0534  BP: (!) 118/35 (!) 114/40 (!) 108/49   Pulse: 72 72 (!) 31 64  Resp: 18  18   Temp: 98.2 F (36.8 C)  98.1 F (36.7 C)   TempSrc: Oral  Oral   SpO2: 96% 97% 96%   Weight:      Height:        Intake/Output Summary (Last 24 hours) at 11/11/15 0733 Last data filed at 11/11/15 0538  Gross per 24 hour  Intake           2216.5 ml  Output             1200 ml  Net           1016.5 ml   Weight change:  Exam:   General:  Pt is alert, follows commands appropriately, not in acute distress  HEENT: No icterus, No thrush, No neck mass, [place]/AT  Cardiovascular: RRR, S1/S2, no rubs, no gallops  Respiratory:Bibasilar rales with expiratory wheeze. Good air movement.  Abdomen: Soft/+BS, non tender, non distended, no guarding  Extremities: No edema, No lymphangitis, No petechiae, No rashes, no synovitis   Data Reviewed: I have personally reviewed following labs and  imaging studies Basic Metabolic Panel:  Recent Labs Lab 11/10/15 1059 11/11/15 0516  NA 132* 138  K 3.4* 3.0*  CL 89* 98*  CO2 32 29  GLUCOSE 165* 125*  BUN 9 10  CREATININE 0.87 0.82  CALCIUM 9.3 8.6*   Liver Function Tests:  Recent Labs Lab 11/11/15 0516  AST 24  ALT 13*  ALKPHOS 43  BILITOT 0.6  PROT 6.3*  ALBUMIN  2.6*   No results for input(s): LIPASE, AMYLASE in the last 168 hours. No results for input(s): AMMONIA in the last 168 hours. Coagulation Profile: No results for input(s): INR, PROTIME in the last 168 hours. CBC:  Recent Labs Lab 11/10/15 1059 11/11/15 0516  WBC 8.0 4.9  HGB 14.0 12.5  HCT 42.0 38.1  MCV 92.9 92.0  PLT 192 182   Cardiac Enzymes: No results for input(s): CKTOTAL, CKMB, CKMBINDEX, TROPONINI in the last 168 hours. BNP: Invalid input(s): POCBNP CBG:  Recent Labs Lab 11/10/15 1726 11/10/15 2232  GLUCAP 258* 171*   HbA1C:  Recent Labs  11/10/15 1539  HGBA1C 6.7*   Urine analysis: No results found for: COLORURINE, APPEARANCEUR, LABSPEC, PHURINE, GLUCOSEU, HGBUR, BILIRUBINUR, KETONESUR, PROTEINUR, UROBILINOGEN, NITRITE, LEUKOCYTESUR Sepsis Labs: @LABRCNTIP (procalcitonin:4,lacticidven:4) )No results found for this or any previous visit (from the past 240 hour(s)).   Scheduled Meds: . amLODipine  5 mg Oral Daily  . aspirin EC  81 mg Oral Daily  . azithromycin  500 mg Intravenous Q24H  . cefTRIAXone (ROCEPHIN)  IV  1 g Intravenous Q24H  . enoxaparin (LOVENOX) injection  40 mg Subcutaneous Q24H  . guaiFENesin  600 mg Oral BID  . insulin aspart  0-9 Units Subcutaneous TID WC  . metoprolol tartrate  25 mg Oral BID  . predniSONE  50 mg Oral Q breakfast  . simvastatin  20 mg Oral QHS  . tiotropium  18 mcg Inhalation Daily  . triamterene-hydrochlorothiazide  1 tablet Oral Daily   Continuous Infusions: . sodium chloride 75 mL/hr at 11/11/15 0009    Procedures/Studies: Dg Chest 2 View  Result Date: 11/10/2015 CLINICAL DATA:  Cough, shortness of breath EXAM: CHEST  2 VIEW COMPARISON:  10/18/2014 FINDINGS: There is right upper lobe airspace disease concerning for pneumonia. There is no pleural effusion or pneumothorax. The heart and mediastinal contours are unremarkable. The osseous structures are unremarkable. IMPRESSION: Right upper lobe pneumonia.  Followup PA and lateral chest X-ray is recommended in 3-4 weeks following trial of antibiotic therapy to ensure resolution and exclude underlying malignancy. Electronically Signed   By: Kathreen Devoid   On: 11/10/2015 13:05    Desiree Mckissic, DO  [facility] Pager [phone]  If 7PM-7AM, please contact night-coverage [email or web address] Password [email or web address] 11/11/2015, 7:33 AM   LOS: 0 days

## 2015-11-11 NOTE — Telephone Encounter (Signed)
Seen at ER, admitted with PNA

## 2015-11-12 DIAGNOSIS — I2510 Atherosclerotic heart disease of native coronary artery without angina pectoris: Secondary | ICD-10-CM

## 2015-11-12 DIAGNOSIS — E118 Type 2 diabetes mellitus with unspecified complications: Secondary | ICD-10-CM | POA: Diagnosis not present

## 2015-11-12 DIAGNOSIS — J189 Pneumonia, unspecified organism: Secondary | ICD-10-CM

## 2015-11-12 DIAGNOSIS — E785 Hyperlipidemia, unspecified: Secondary | ICD-10-CM | POA: Diagnosis not present

## 2015-11-12 DIAGNOSIS — J449 Chronic obstructive pulmonary disease, unspecified: Secondary | ICD-10-CM | POA: Diagnosis not present

## 2015-11-12 DIAGNOSIS — E1151 Type 2 diabetes mellitus with diabetic peripheral angiopathy without gangrene: Secondary | ICD-10-CM | POA: Diagnosis not present

## 2015-11-12 DIAGNOSIS — I10 Essential (primary) hypertension: Secondary | ICD-10-CM | POA: Diagnosis not present

## 2015-11-12 DIAGNOSIS — J441 Chronic obstructive pulmonary disease with (acute) exacerbation: Secondary | ICD-10-CM | POA: Diagnosis not present

## 2015-11-12 LAB — MAGNESIUM: MAGNESIUM: 1.6 mg/dL — AB (ref 1.7–2.4)

## 2015-11-12 LAB — BASIC METABOLIC PANEL
ANION GAP: 6 (ref 5–15)
BUN: 12 mg/dL (ref 6–20)
CHLORIDE: 106 mmol/L (ref 101–111)
CO2: 28 mmol/L (ref 22–32)
Calcium: 7.8 mg/dL — ABNORMAL LOW (ref 8.9–10.3)
Creatinine, Ser: 0.7 mg/dL (ref 0.44–1.00)
GFR calc Af Amer: >60 mL/min (ref >60)
Glucose, Bld: 99 mg/dL (ref 65–99)
POTASSIUM: 4 mmol/L (ref 3.5–5.1)
SODIUM: 140 mmol/L (ref 135–145)

## 2015-11-12 LAB — RESPIRATORY PANEL BY PCR
Adenovirus: NOT DETECTED
BORDETELLA PERTUSSIS-RVPCR: NOT DETECTED
CORONAVIRUS 229E-RVPPCR: NOT DETECTED
CORONAVIRUS HKU1-RVPPCR: NOT DETECTED
CORONAVIRUS OC43-RVPPCR: NOT DETECTED
Chlamydophila pneumoniae: NOT DETECTED
Coronavirus NL63: NOT DETECTED
INFLUENZA B-RVPPCR: NOT DETECTED
Influenza A: NOT DETECTED
MYCOPLASMA PNEUMONIAE-RVPPCR: NOT DETECTED
Metapneumovirus: NOT DETECTED
PARAINFLUENZA VIRUS 1-RVPPCR: DETECTED — AB
PARAINFLUENZA VIRUS 2-RVPPCR: NOT DETECTED
Parainfluenza Virus 3: NOT DETECTED
Parainfluenza Virus 4: NOT DETECTED
RESPIRATORY SYNCYTIAL VIRUS-RVPPCR: NOT DETECTED
Rhinovirus / Enterovirus: NOT DETECTED

## 2015-11-12 LAB — LEGIONELLA PNEUMOPHILA SEROGP 1 UR AG: L. PNEUMOPHILA SEROGP 1 UR AG: NEGATIVE

## 2015-11-12 LAB — GLUCOSE, CAPILLARY
GLUCOSE-CAPILLARY: 134 mg/dL — AB (ref 65–99)
Glucose-Capillary: 70 mg/dL (ref 65–99)

## 2015-11-12 MED ORDER — IPRATROPIUM-ALBUTEROL 0.5-2.5 (3) MG/3ML IN SOLN: RESPIRATORY_TRACT | 0 refills | Status: DC

## 2015-11-12 MED ORDER — PREDNISONE 50 MG PO TABS: 50.0000 mg | ORAL_TABLET | Freq: Every day | ORAL | 0 refills | Status: DC

## 2015-11-12 MED ORDER — MAGNESIUM CHLORIDE 64 MG PO TBEC
2.0000 | DELAYED_RELEASE_TABLET | Freq: Two times a day (BID) | ORAL | 0 refills | Status: AC
Start: 2015-11-13 — End: 2015-11-14

## 2015-11-12 MED ORDER — MAGNESIUM CHLORIDE 64 MG PO TBEC
2.0000 | DELAYED_RELEASE_TABLET | Freq: Two times a day (BID) | ORAL | Status: DC
  Administered 2015-11-12: 128 mg via ORAL
  Filled 2015-11-12 (×2): qty 2

## 2015-11-12 MED ORDER — GUAIFENESIN ER 600 MG PO TB12: 600.0000 mg | ORAL_TABLET | Freq: Two times a day (BID) | ORAL | 0 refills | Status: DC

## 2015-11-12 MED ORDER — AZITHROMYCIN 250 MG PO TABS
250.0000 mg | ORAL_TABLET | Freq: Every day | ORAL | 0 refills | Status: DC
Start: 2015-11-13 — End: 2015-11-12

## 2015-11-12 MED ORDER — AZITHROMYCIN 250 MG PO TABS: 250.0000 mg | ORAL_TABLET | Freq: Every day | ORAL | 0 refills | Status: DC

## 2015-11-12 MED ORDER — MAGNESIUM CHLORIDE 64 MG PO TBEC: 2.0000 | DELAYED_RELEASE_TABLET | Freq: Two times a day (BID) | ORAL | 0 refills | Status: DC

## 2015-11-12 NOTE — Discharge Summary (Signed)
Physician Discharge Summary  Desiree Floyd [id number] DOB: 1932/08/06 DOA: 11/10/2015  PCP: [name], MD  Admit date: 11/10/2015 Discharge date: 11/12/2015  Admitted From: Home Disposition:  Home  Recommendations for Outpatient Follow-up:  1. Follow up with PCP in 1 week 2. Please obtain BMP/CBC in one week. Magnesium.  3. Reevaluate need for antihypertensives  Equipment/Devices: Nebulizer  Discharge Condition: Stable CODE STATUS: Full Code Diet recommendation: Heart healthy   Brief/Interim Summary: 82 year old female with a history of COPD, CAD, squamous cell cancer left, hypertension, hyperlipidemia presented with one-week history of increasing shortness breath, cough, malaise and subjective chills. Patient has also had decreased oral intake for the past week.  Unfortunately, she continues to smoke one half pack per day. She has had a nearly 60-pack-year history. In addition, the patient states that she has only been using her Symbicort once daily and not using her Spiriva as directed. Upon presentation, the patient was noted to be afebrile and hemodynamically stable with saturations of 96-97 percent on room air. Chest x-ray revealed possible right upper lobe opacity. The patient was admitted for intravenous antibiotics and treatment of possible pneumonia.   Hospital course:  COPD exacerbation Patient was given steroids and bronchodilators. Viral panel positive for parainfluenza virus. Patient's wheezing improved with continued treatments. Sent home with a nebulizer to continue nebulizer treatment and prednisone therapy  Pulmonary opacity Likely secondary to viral pneumonia. Supportive care given. Initial antibiotics (azithromycin and ceftriaxone) were held after first dose  Dehydration Hyponatremia IV fluids given, and sodium improved with improvement in hydration status  Diabetes mellitus, type 2 Patient on sliding scale while inpatient  Tobacco  abuse Cessation discussed  Coronary artery disease with stent placement Continued aspirin and statin. Continued metoprolol  Hypertension Continued metoprolol. Amlodipine, hydrochlorothiazide, triamterene held secondary to low blood pressures.  Discharge Diagnoses:  Active Problems:   HLD (hyperlipidemia)   Gout   Essential hypertension   COPD (chronic obstructive pulmonary disease) (HCC)   Diabetes mellitus type 2, controlled, with complications ([place])   CAD (coronary artery disease)   Diabetic peripheral vascular disease (HCC)   CAP (community acquired pneumonia)   Community acquired pneumonia of right upper lobe of lung ([place])    Discharge Instructions     Medication List    STOP taking these medications   amLODipine 5 MG tablet Commonly known as:  NORVASC   Cholecalciferol 1000 units tablet   D3-1000 1000 units tablet Generic drug:  Cholecalciferol   doxycycline 100 MG tablet Commonly known as:  VIBRA-TABS   triamterene-hydrochlorothiazide 37.5-25 MG tablet Commonly known as:  MAXZIDE-25   Vitamin D3 2000 units Tabs     TAKE these medications   acetaminophen 500 MG tablet Commonly known as:  TYLENOL Take 1,000 mg by mouth every 6 (six) hours as needed for moderate pain. Reported on 04/22/2015   albuterol 108 (90 Base) MCG/ACT inhaler Commonly known as:  PROVENTIL HFA;VENTOLIN HFA Inhale 2 puffs into the lungs every 6 (six) hours as needed for wheezing or shortness of breath.   alendronate 70 MG tablet Commonly known as:  FOSAMAX Take 1 tablet (70 mg total) by mouth every 7 (seven) days. Take with a full glass of water on an empty stomach. What changed:  when to take this  additional instructions   aspirin 81 MG tablet Take 81 mg by mouth daily.   azithromycin 250 MG tablet Commonly known as:  ZITHROMAX Take 1 tablet (250 mg total) by mouth daily. Start taking on:  11/13/2015  Calcium-Vitamin D 600-400 MG-UNIT Tabs Take 1 tablet by mouth  daily.   Fish Oil 1000 MG Cpdr Take 1 capsule by mouth daily.   guaiFENesin 600 MG 12 hr tablet Commonly known as:  MUCINEX Take 1 tablet (600 mg total) by mouth 2 (two) times daily.   ibuprofen 400 MG tablet Commonly known as:  ADVIL,MOTRIN Take 400 mg by mouth every 6 (six) hours as needed for moderate pain.   ipratropium-albuterol 0.5-2.5 (3) MG/3ML Soln Commonly known as:  DUONEB Use every 4 hours for 48 hours, then use every 4 hours as needed for wheezing or shortness of breath   magnesium chloride 64 MG Tbec SR tablet Commonly known as:  SLOW-MAG Take 2 tablets (128 mg total) by mouth 2 (two) times daily. Start taking on:  11/13/2015   metoprolol tartrate 25 MG tablet Commonly known as:  LOPRESSOR TAKE ONE-HALF TABLET  IN  THE MORNING AND ONE-HALF  TABLET IN THE EVENING.   predniSONE 50 MG tablet Commonly known as:  DELTASONE Take 1 tablet (50 mg total) by mouth daily with breakfast. Start taking on:  11/13/2015   simvastatin 20 MG tablet Commonly known as:  ZOCOR Take 1 tablet by mouth at  bedtime   SPIRIVA HANDIHALER 18 MCG inhalation capsule Generic drug:  tiotropium Inhale the contents of 1  capsule via HandiHaler  daily   SYMBICORT 160-4.5 MCG/ACT inhaler Generic drug:  budesonide-formoterol Use 2 puffs daily   vitamin B-12 1000 MCG tablet Commonly known as:  CYANOCOBALAMIN Take 1,000 mcg by mouth daily.      Follow-up Information    [name], MD. Schedule an appointment as soon as possible for a visit in 1 week(s).   Specialty:  Family Medicine Contact information: [place] [id number] [phone]          Allergies  Allergen Reactions  . Augmentin [Amoxicillin-Pot Clavulanate] Other (See Comments)    Diarrhea and vomiting  . Chantix [Varenicline] Other (See Comments)    Bad dreams  . Lisinopril Cough    Consultations:  None   Procedures/Studies: Dg Chest 2 View  Result Date: 11/11/2015 CLINICAL  DATA:  Fever and chills yesterday.  Pneumonia. EXAM: CHEST  2 VIEW COMPARISON:  PA and lateral chest 11/10/2015 and 10/18/2014. FINDINGS: The lungs are emphysematous. Hazy airspace opacity right upper lobe seen on yesterday's examination appears improved. No pneumothorax or pleural effusion. IMPRESSION: Improved hazy airspace opacity right upper lobe. No new abnormality. Emphysema. Electronically Signed   By: Inge Rise M.D.   On: 11/11/2015 11:08   Dg Chest 2 View  Result Date: 11/10/2015 CLINICAL DATA:  Cough, shortness of breath EXAM: CHEST  2 VIEW COMPARISON:  10/18/2014 FINDINGS: There is right upper lobe airspace disease concerning for pneumonia. There is no pleural effusion or pneumothorax. The heart and mediastinal contours are unremarkable. The osseous structures are unremarkable. IMPRESSION: Right upper lobe pneumonia. Followup PA and lateral chest X-ray is recommended in 3-4 weeks following trial of antibiotic therapy to ensure resolution and exclude underlying malignancy. Electronically Signed   By: Kathreen Devoid   On: 11/10/2015 13:05     Subjective: Patient feels much better. No chest pain. Improvement of dyspnea.  Discharge Exam: Vitals:   11/12/15 0637 11/12/15 0916  BP: (!) 116/59 (!) 116/58  Pulse: 72 74  Resp: 20   Temp: 97.7 F (36.5 C)    Vitals:   11/11/15 2310 11/12/15 0148 11/12/15 0637 11/12/15 0916  BP: (!) 120/50  Marland Kitchen)  116/59 (!) 116/58  Pulse: 75 72 72 74  Resp:  18 20   Temp:   97.7 F (36.5 C)   TempSrc:      SpO2:   98% 98%  Weight:      Height:        General: Pt is alert, awake, not in acute distress Cardiovascular: RRR, S1/S2 +, no rubs, no gallops Respiratory: wheezing bilaterally, prolonged expiratory phase. Abdominal: Soft, NT, ND, bowel sounds + Extremities: no edema, no cyanosis    The results of significant diagnostics from this hospitalization (including imaging, microbiology, ancillary and laboratory) are listed below for  reference.     Microbiology: Recent Results (from the past 240 hour(s))  Blood culture (routine x 2)     Status: None (Preliminary result)   Collection Time: 11/10/15  3:40 PM  Result Value Ref Range Status   Specimen Description BLOOD RIGHT ANTECUBITAL  Final   Special Requests BOTTLES DRAWN AEROBIC AND ANAEROBIC 10CC  Final   Culture NO GROWTH < 24 HOURS  Final   Report Status PENDING  Incomplete  Blood culture (routine x 2)     Status: None (Preliminary result)   Collection Time: 11/10/15  3:45 PM  Result Value Ref Range Status   Specimen Description BLOOD RIGHT HAND  Final   Special Requests IN PEDIATRIC BOTTLE 3CC  Final   Culture NO GROWTH < 24 HOURS  Final   Report Status PENDING  Incomplete  Urine culture     Status: Abnormal   Collection Time: 11/10/15  5:59 PM  Result Value Ref Range Status   Specimen Description URINE, CLEAN CATCH  Final   Special Requests NONE  Final   Culture <10,000 COLONIES/mL INSIGNIFICANT GROWTH (A)  Final   Report Status 11/11/2015 FINAL  Final  Respiratory Panel by PCR     Status: Abnormal   Collection Time: 11/11/15  4:22 PM  Result Value Ref Range Status   Adenovirus NOT DETECTED NOT DETECTED Final   Coronavirus 229E NOT DETECTED NOT DETECTED Final   Coronavirus HKU1 NOT DETECTED NOT DETECTED Final   Coronavirus NL63 NOT DETECTED NOT DETECTED Final   Coronavirus OC43 NOT DETECTED NOT DETECTED Final   Metapneumovirus NOT DETECTED NOT DETECTED Final   Rhinovirus / Enterovirus NOT DETECTED NOT DETECTED Final   Influenza A NOT DETECTED NOT DETECTED Final   Influenza B NOT DETECTED NOT DETECTED Final   Parainfluenza Virus 1 DETECTED (A) NOT DETECTED Final   Parainfluenza Virus 2 NOT DETECTED NOT DETECTED Final   Parainfluenza Virus 3 NOT DETECTED NOT DETECTED Final   Parainfluenza Virus 4 NOT DETECTED NOT DETECTED Final   Respiratory Syncytial Virus NOT DETECTED NOT DETECTED Final   Bordetella pertussis NOT DETECTED NOT DETECTED Final    Chlamydophila pneumoniae NOT DETECTED NOT DETECTED Final   Mycoplasma pneumoniae NOT DETECTED NOT DETECTED Final     Labs: BNP (last 3 results) No results for input(s): BNP in the last 8760 hours. Basic Metabolic Panel:  Recent Labs Lab 11/10/15 1059 11/11/15 0516 11/12/15 0531  NA 132* 138 140  K 3.4* 3.0* 4.0  CL 89* 98* 106  CO2 32 29 28  GLUCOSE 165* 125* 99  BUN 9 10 12   CREATININE 0.87 0.82 0.70  CALCIUM 9.3 8.6* 7.8*  MG  --   --  1.6*   Liver Function Tests:  Recent Labs Lab 11/11/15 0516  AST 24  ALT 13*  ALKPHOS 43  BILITOT 0.6  PROT 6.3*  ALBUMIN  2.6*   No results for input(s): LIPASE, AMYLASE in the last 168 hours. No results for input(s): AMMONIA in the last 168 hours. CBC:  Recent Labs Lab 11/10/15 1059 11/11/15 0516  WBC 8.0 4.9  HGB 14.0 12.5  HCT 42.0 38.1  MCV 92.9 92.0  PLT 192 182   Cardiac Enzymes: No results for input(s): CKTOTAL, CKMB, CKMBINDEX, TROPONINI in the last 168 hours. BNP: Invalid input(s): POCBNP CBG:  Recent Labs Lab 11/11/15 1212 11/11/15 1725 11/11/15 2132 11/12/15 0754 11/12/15 1154  GLUCAP 126* 267* 152* 70 134*   D-Dimer No results for input(s): DDIMER in the last 72 hours. Hgb A1c  Recent Labs  11/10/15 1539  HGBA1C 6.7*   Lipid Profile No results for input(s): CHOL, HDL, LDLCALC, TRIG, CHOLHDL, LDLDIRECT in the last 72 hours. Thyroid function studies No results for input(s): TSH, T4TOTAL, T3FREE, THYROIDAB in the last 72 hours.  Invalid input(s): FREET3 Anemia work up No results for input(s): VITAMINB12, FOLATE, FERRITIN, TIBC, IRON, RETICCTPCT in the last 72 hours. Urinalysis No results found for: COLORURINE, APPEARANCEUR, LABSPEC, PHURINE, GLUCOSEU, HGBUR, BILIRUBINUR, KETONESUR, PROTEINUR, UROBILINOGEN, NITRITE, LEUKOCYTESUR Sepsis Labs Invalid input(s): PROCALCITONIN,  WBC,  LACTICIDVEN Microbiology Recent Results (from the past 240 hour(s))  Blood culture (routine x 2)     Status:  None (Preliminary result)   Collection Time: 11/10/15  3:40 PM  Result Value Ref Range Status   Specimen Description BLOOD RIGHT ANTECUBITAL  Final   Special Requests BOTTLES DRAWN AEROBIC AND ANAEROBIC 10CC  Final   Culture NO GROWTH < 24 HOURS  Final   Report Status PENDING  Incomplete  Blood culture (routine x 2)     Status: None (Preliminary result)   Collection Time: 11/10/15  3:45 PM  Result Value Ref Range Status   Specimen Description BLOOD RIGHT HAND  Final   Special Requests IN PEDIATRIC BOTTLE 3CC  Final   Culture NO GROWTH < 24 HOURS  Final   Report Status PENDING  Incomplete  Urine culture     Status: Abnormal   Collection Time: 11/10/15  5:59 PM  Result Value Ref Range Status   Specimen Description URINE, CLEAN CATCH  Final   Special Requests NONE  Final   Culture <10,000 COLONIES/mL INSIGNIFICANT GROWTH (A)  Final   Report Status 11/11/2015 FINAL  Final  Respiratory Panel by PCR     Status: Abnormal   Collection Time: 11/11/15  4:22 PM  Result Value Ref Range Status   Adenovirus NOT DETECTED NOT DETECTED Final   Coronavirus 229E NOT DETECTED NOT DETECTED Final   Coronavirus HKU1 NOT DETECTED NOT DETECTED Final   Coronavirus NL63 NOT DETECTED NOT DETECTED Final   Coronavirus OC43 NOT DETECTED NOT DETECTED Final   Metapneumovirus NOT DETECTED NOT DETECTED Final   Rhinovirus / Enterovirus NOT DETECTED NOT DETECTED Final   Influenza A NOT DETECTED NOT DETECTED Final   Influenza B NOT DETECTED NOT DETECTED Final   Parainfluenza Virus 1 DETECTED (A) NOT DETECTED Final   Parainfluenza Virus 2 NOT DETECTED NOT DETECTED Final   Parainfluenza Virus 3 NOT DETECTED NOT DETECTED Final   Parainfluenza Virus 4 NOT DETECTED NOT DETECTED Final   Respiratory Syncytial Virus NOT DETECTED NOT DETECTED Final   Bordetella pertussis NOT DETECTED NOT DETECTED Final   Chlamydophila pneumoniae NOT DETECTED NOT DETECTED Final   Mycoplasma pneumoniae NOT DETECTED NOT DETECTED Final      Time coordinating discharge: Over 30 minutes  SIGNED:   Cordelia Poche, MD [facility] 11/12/2015, 2:22 PM Pager [phone]  If 7PM-7AM, please contact night-coverage [email or web address] Password [email or web address]

## 2015-11-12 NOTE — Progress Notes (Signed)
Patient ambulated hall with RN. Patient's SP02 on room air maintained 95%-97%. HR 130s. Patient did feel " a little short of breath". Patient sat back down in bed after ambulation and HR 79 after two minutes of rest. Patient stated "she feels better" once sitting and shortness of breath resolved.

## 2015-11-12 NOTE — Progress Notes (Signed)
Bartolo Darter to be D/C'd Home per MD order.  Discussed with the patient and all questions fully answered.  VSS, Skin clean, dry and intact without evidence of skin break down, no evidence of skin tears noted. IV catheter discontinued intact. Site without signs and symptoms of complications. Dressing and pressure applied.  An After Visit Summary was printed and given to the patient. Patient received prescription.  D/c education completed with patient/family including follow up instructions, medication list, d/c activities limitations if indicated, with other d/c instructions as indicated by MD - patient able to verbalize understanding, all questions fully answered.   Patient instructed to return to ED, call 911, or call MD for any changes in condition.   Patient to be escorted via [place], and D/C home via private auto.  L'[place]PERANCE, Maccoy Haubner C 11/12/2015 2:31 PM

## 2015-11-12 NOTE — Progress Notes (Signed)
Patient told RN that her ride needs to pick her up by 3pm for discharge. MD paged to make aware.

## 2015-11-12 NOTE — Progress Notes (Signed)
Inpatient Diabetes Program Recommendations  AACE/ADA: New Consensus Statement on Inpatient Glycemic Control (2015)  Target Ranges:  Prepandial:   less than 140 mg/dL      Peak postprandial:   less than 180 mg/dL (1-2 hours)      Critically ill patients:  140 - 180 mg/dL   Lab Results  Component Value Date   GLUCAP 70 11/12/2015   HGBA1C 6.7 (H) 11/10/2015    Review of Glycemic Control:  Results for OSHYN, METRO (MRN [id number]) as of 11/12/2015 11:51  Ref. Range 11/11/2015 08:09 11/11/2015 12:12 11/11/2015 17:25 11/11/2015 21:32 11/12/2015 07:54  Glucose-Capillary Latest Ref Range: 65 - 99 mg/dL 88 126 (H) 267 (H) 152 (H) 70   Inpatient Diabetes Program Recommendations:   Note the blood sugars increased with PO Prednisone.  May consider increasing Novolog correction to moderate tid with meals and HS.   Thanks, Adah Perl, RN, BC-ADM Inpatient Diabetes Coordinator Pager [phone] (8a-5p)

## 2015-11-13 ENCOUNTER — Telehealth: Payer: Self-pay

## 2015-11-13 NOTE — Telephone Encounter (Signed)
Transition Care Management Follow-up Telephone Call    Date discharged? 11/12/2015  How have you been since you were released from the hospital? Recovering.   Any patient concerns? Pt was advised to discontinue all BP medications and Vit D until seen by PCP due to low BP readings while hospitalized. However, pt is still taking Lopressor. Appt on 11/21/15 was rescheduled to 11/14/15. Pt will bring all medications to appt to discuss with PCP.    Items Reviewed:  Medications reviewed: Yes  Allergies reviewed: Yes  Dietary changes reviewed: N/A  Referrals reviewed: N/A   Functional Questionnaire:  Independent - I Dependent - D    Activities of Daily Living (ADLs):    Personal hygiene - I Dressing - I Eating - I Maintaining continence - I Transferring - I   Independent Activities of Daily Living (iADLs): Basic communication skills - I Transportation - I  Meal preparation  - I Shopping - I Housework - I  Managing medications - I Managing personal finances - I    Confirmed importance and date/time of follow-up visits scheduled YES  Provider Appointment booked with PCP 11/14/15 @ 1430  Confirmed with patient if condition begins to worsen call PCP or go to the ER.  Patient was given the office number and encouraged to call back with question or concerns: YES

## 2015-11-14 ENCOUNTER — Ambulatory Visit (INDEPENDENT_AMBULATORY_CARE_PROVIDER_SITE_OTHER): Payer: Medicare Other | Admitting: Family Medicine

## 2015-11-14 ENCOUNTER — Encounter: Payer: Self-pay | Admitting: Family Medicine

## 2015-11-14 VITALS — BP 126/60 | HR 80 | Temp 98.5°F | Wt 116.0 lb

## 2015-11-14 DIAGNOSIS — F17200 Nicotine dependence, unspecified, uncomplicated: Secondary | ICD-10-CM

## 2015-11-14 DIAGNOSIS — I10 Essential (primary) hypertension: Secondary | ICD-10-CM

## 2015-11-14 DIAGNOSIS — J441 Chronic obstructive pulmonary disease with (acute) exacerbation: Secondary | ICD-10-CM | POA: Diagnosis not present

## 2015-11-14 DIAGNOSIS — Z5189 Encounter for other specified aftercare: Secondary | ICD-10-CM

## 2015-11-14 DIAGNOSIS — J122 Parainfluenza virus pneumonia: Secondary | ICD-10-CM

## 2015-11-14 HISTORY — DX: Parainfluenza virus pneumonia: J12.2

## 2015-11-14 NOTE — Assessment & Plan Note (Addendum)
Continues improving. Looks like she did complete azithromycin course. Discussed mucinex ER dosing

## 2015-11-14 NOTE — Assessment & Plan Note (Addendum)
Encouraged compliance with symbicort 2 puff BID and spiriva nightly. Not needing supplemental oxygen at this time.

## 2015-11-14 NOTE — Progress Notes (Signed)
Pre visit review using our clinic review tool, if applicable. No additional management support is needed unless otherwise documented below in the visit note. 

## 2015-11-14 NOTE — Assessment & Plan Note (Signed)
Regular with metoprolol 25mg  bid.  Amlodipine and maxzide were held during recent hospitalization due to hypotension. Current BP is at goal on metoprolol 25mg  bid. Advised monitor bp a few times a week at home (at local pharmacy or fire station) and start amlodipine if BP trending up.

## 2015-11-14 NOTE — Assessment & Plan Note (Signed)
Quit 11/10/2015 again. Encouraged full cessation.

## 2015-11-14 NOTE — Assessment & Plan Note (Signed)
Finished prednisone course for acute COPD exacerbation. Currently using duoneb Q4 hours, discussed slow taper off duoneb to PRN.

## 2015-11-14 NOTE — Progress Notes (Signed)
BP 126/60   Pulse 80   Temp 98.5 F (36.9 C) (Oral)   Wt 116 lb (52.6 kg)   SpO2 94%   BMI 21.22 kg/m  no RA  CC: hosp f/u visit Subjective:    Patient ID: Desiree Floyd, female    DOB: [birth year], 82 y.o.   MRN: [id number]  HPI: Desiree Floyd is a 82 y.o. female presenting on 11/14/2015 for Follow-up (hospital)   Recent hospitalization for RUL pneumonia with COPD exacerbation. Treated with steroid, nebulizer, azithromycin and rocephin. Antibiotics were stopped after viral panel returned positive for parainfluenza virus. She did not need oxygen during hospitalization. Severe COPD, continued smoker. Had not been compliant with spiriva and had only been taking symbicort 1 puff daily.   Since she's been home, she has continued coughing. She is taking mucinex expectorant.   Antihypertensives were discontinued except for metoprolol 12.5mg  BID.  Smoking - hasn't smoked since Monday.   Admit date: 11/10/2015 Discharge date: 11/12/2015 TCM phone call: 11/13/2015  Discharge Diagnoses:  Active Problems:   HLD (hyperlipidemia)   Gout   Essential hypertension   COPD (chronic obstructive pulmonary disease) (HCC)   Diabetes mellitus type 2, controlled, with complications ([place])   CAD (coronary artery disease)   Diabetic peripheral vascular disease ([place])   CAP (community acquired pneumonia)   Community acquired pneumonia of right upper lobe of lung ([place])  Recommendations for Outpatient Follow-up:  1. Follow up with PCP in 1 week 2. Please obtain BMP/CBC in one week. Magnesium.  3. Reevaluate need for antihypertensives  Equipment/Devices: Nebulizer  Discharge Condition: Stable CODE STATUS: Full Code Diet recommendation: Heart healthy   Relevant past medical, surgical, family and social history reviewed and updated as indicated. Interim medical history since our last visit reviewed. Allergies and medications reviewed and updated. Current Outpatient Prescriptions on File  Prior to Visit  Medication Sig  . albuterol (PROVENTIL HFA;VENTOLIN HFA) 108 (90 Base) MCG/ACT inhaler Inhale 2 puffs into the lungs every 6 (six) hours as needed for wheezing or shortness of breath.  Marland Kitchen alendronate (FOSAMAX) 70 MG tablet Take 1 tablet (70 mg total) by mouth every 7 (seven) days. Take with a full glass of water on an empty stomach. (Patient taking differently: Take 70 mg by mouth every Friday. Take with a full glass of water on an empty stomach.)  . aspirin 81 MG tablet Take 81 mg by mouth daily.    Marland Kitchen guaiFENesin (MUCINEX) 600 MG 12 hr tablet Take 1 tablet (600 mg total) by mouth 2 (two) times daily.  Marland Kitchen ibuprofen (ADVIL,MOTRIN) 400 MG tablet Take 400 mg by mouth every 6 (six) hours as needed for moderate pain.  Marland Kitchen ipratropium-albuterol (DUONEB) 0.5-2.5 (3) MG/3ML SOLN Use every 4 hours for 48 hours, then use every 4 hours as needed for wheezing or shortness of breath  . magnesium chloride (SLOW-MAG) 64 MG TBEC SR tablet Take 2 tablets (128 mg total) by mouth 2 (two) times daily.  . metoprolol tartrate (LOPRESSOR) 25 MG tablet TAKE ONE-HALF TABLET  IN  THE MORNING AND ONE-HALF  TABLET IN THE EVENING.  . Omega-3 Fatty Acids (FISH OIL) 1000 MG CPDR Take 1 capsule by mouth daily.  . simvastatin (ZOCOR) 20 MG tablet Take 1 tablet by mouth at  bedtime  . SPIRIVA HANDIHALER 18 MCG inhalation capsule Inhale the contents of 1  capsule via HandiHaler  daily  . SYMBICORT 160-4.5 MCG/ACT inhaler Use 2 puffs daily (Patient taking differently: 2 puffs twice daily)  .  vitamin B-12 (CYANOCOBALAMIN) 1000 MCG tablet Take 1,000 mcg by mouth daily.  . Calcium Carb-Cholecalciferol (CALCIUM-VITAMIN D) 600-400 MG-UNIT TABS Take 1 tablet by mouth daily.   No current facility-administered medications on file prior to visit.     Review of Systems Per HPI unless specifically indicated in ROS section     Objective:    BP 126/60   Pulse 80   Temp 98.5 F (36.9 C) (Oral)   Wt 116 lb (52.6 kg)   SpO2  94%   BMI 21.22 kg/m   Wt Readings from Last 3 Encounters:  11/14/15 116 lb (52.6 kg)  11/10/15 117 lb 8.1 oz (53.3 kg)  07/29/15 116 lb 12 oz (53 kg)    Physical Exam  Constitutional: She appears well-developed and well-nourished. No distress.  HENT:  Mouth/Throat: Oropharynx is clear and moist. No oropharyngeal exudate.  Eyes: Conjunctivae are normal. Pupils are equal, round, and reactive to light. No scleral icterus.  Cardiovascular: Normal rate, regular rhythm, normal heart sounds and intact distal pulses.   No murmur heard. Pulmonary/Chest: Effort normal. No respiratory distress. She has wheezes (mild). She has no rales.  coarse  Musculoskeletal: She exhibits no edema.  Skin: Skin is warm and dry. No rash noted.  Psychiatric: Her mood appears anxious.  Nursing note and vitals reviewed.     Assessment & Plan:  No labs obtained today.  Problem List Items Addressed This Visit    COPD (chronic obstructive pulmonary disease) ([place])    Encouraged compliance with symbicort 2 puff BID and spiriva nightly. Not needing supplemental oxygen at this time.      COPD exacerbation (HCC)    Finished prednisone course for acute COPD exacerbation. Currently using duoneb Q4 hours, discussed slow taper off duoneb to PRN.       Essential hypertension    Regular with metoprolol 25mg  bid.  Amlodipine and maxzide were held during recent hospitalization due to hypotension. Current BP is at goal on metoprolol 25mg  bid. Advised monitor bp a few times a week at home (at local pharmacy or fire station) and start amlodipine if BP trending up.       Parainfluenza virus pneumonia - Primary    Continues improving. Looks like she did complete azithromycin course. Discussed mucinex ER dosing      Smoker    Quit 11/10/2015 again. Encouraged full cessation.       Other Visit Diagnoses   None.      Follow up plan: Return in about 4 weeks (around 12/12/2015) for follow up visit.  [name],  MD

## 2015-11-14 NOTE — Patient Instructions (Addendum)
I think you are getting better.  Nice to see you today.  Get flu shot at local pharmacy when you're feeling better, then let us know to update your chart.  Keep an eye on your blood pressures - if consistently >140/90, restart amlodipine.  Reschedule f/u appointment for 4 weeks.  Congratulations on quitting smoking!

## 2015-11-15 LAB — CULTURE, BLOOD (ROUTINE X 2)
Culture: NO GROWTH
Culture: NO GROWTH

## 2015-11-18 ENCOUNTER — Telehealth: Payer: Self-pay

## 2015-11-18 NOTE — Telephone Encounter (Signed)
Pt left /vm; pt was in hospital recently for pneumonia; pt had f/u visit on 11/14/15. Pt taking mucinex twice a day.Pt has been having hot flashes at night where has to change her night clothes x 3 during the night; pt also has hot flashes during the day. Pt request cb.

## 2015-11-18 NOTE — Telephone Encounter (Signed)
Spoke with patient - no fevers. Cough improving. Actually thinks night sweats may have been from humid nights with AC off. Will try to sleep with AC on tonight and let us know if any concerns.

## 2015-11-19 DIAGNOSIS — J441 Chronic obstructive pulmonary disease with (acute) exacerbation: Secondary | ICD-10-CM | POA: Diagnosis not present

## 2015-11-21 ENCOUNTER — Ambulatory Visit: Payer: Medicare Other | Admitting: Family Medicine

## 2015-12-10 ENCOUNTER — Other Ambulatory Visit: Payer: Self-pay | Admitting: *Deleted

## 2015-12-10 ENCOUNTER — Emergency Department (HOSPITAL_COMMUNITY)
Admission: EM | Admit: 2015-12-10 | Discharge: 2015-12-10 | Disposition: A | Discharge status: Home / Self Care | Payer: Medicare Other | Attending: Emergency Medicine | Admitting: Emergency Medicine

## 2015-12-10 ENCOUNTER — Telehealth: Payer: Self-pay | Admitting: Family Medicine

## 2015-12-10 ENCOUNTER — Encounter (HOSPITAL_COMMUNITY): Payer: Self-pay | Admitting: *Deleted

## 2015-12-10 DIAGNOSIS — I2510 Atherosclerotic heart disease of native coronary artery without angina pectoris: Secondary | ICD-10-CM | POA: Diagnosis not present

## 2015-12-10 DIAGNOSIS — E119 Type 2 diabetes mellitus without complications: Secondary | ICD-10-CM | POA: Insufficient documentation

## 2015-12-10 DIAGNOSIS — J449 Chronic obstructive pulmonary disease, unspecified: Secondary | ICD-10-CM | POA: Diagnosis not present

## 2015-12-10 DIAGNOSIS — Z85828 Personal history of other malignant neoplasm of skin: Secondary | ICD-10-CM | POA: Insufficient documentation

## 2015-12-10 DIAGNOSIS — Z955 Presence of coronary angioplasty implant and graft: Secondary | ICD-10-CM | POA: Insufficient documentation

## 2015-12-10 DIAGNOSIS — Z7982 Long term (current) use of aspirin: Secondary | ICD-10-CM | POA: Insufficient documentation

## 2015-12-10 DIAGNOSIS — F17210 Nicotine dependence, cigarettes, uncomplicated: Secondary | ICD-10-CM | POA: Insufficient documentation

## 2015-12-10 DIAGNOSIS — I159 Secondary hypertension, unspecified: Secondary | ICD-10-CM | POA: Diagnosis not present

## 2015-12-10 DIAGNOSIS — I10 Essential (primary) hypertension: Secondary | ICD-10-CM | POA: Diagnosis not present

## 2015-12-10 DIAGNOSIS — I252 Old myocardial infarction: Secondary | ICD-10-CM | POA: Insufficient documentation

## 2015-12-10 DIAGNOSIS — J189 Pneumonia, unspecified organism: Secondary | ICD-10-CM | POA: Diagnosis not present

## 2015-12-10 NOTE — ED Triage Notes (Signed)
Pt reports her BP is up . Pt has an appt with PCP in AM. Pt wants BP meds restarted.

## 2015-12-10 NOTE — Telephone Encounter (Signed)
Pt is on her way to the ed for her bp.  She had her bp checked at the fire station and [place] it was 180/110 and today it was 190/80 She is going to [place]

## 2015-12-10 NOTE — ED Provider Notes (Signed)
[facility] DEPT Provider Note   CSN: [id number] Arrival date & time: 12/10/15  1133     History   Chief Complaint Chief Complaint  Patient presents with  . Hypertension    HPI Desiree Floyd is a 82 y.o. female.  HPI 82 year old female who presents with elevated blood pressures. She has a history of hypertension, hyperlipidemia, CAD, and COPD. States that she was recently hospitalized from October 2 October 4 for pneumonia. She states that she was dehydrated at that time and had low blood pressures so they discontinued her Norvasc and her backside. She has finished her antibiotics and her pneumonia symptoms have almost nearly resolved. States that she has follow-up primary care doctor appointment tomorrow, and her doctor had told her to measure her blood pressures at home. Yesterday she measured her blood pressure to be 180/110 at 2 PM, and this morning at 11 AM her blood pressure was 190/80. She denies feeling any symptoms are feeling unwell. Denies vision or speech changes, confusion, nausea or vomiting, headache, numbness or weakness, chest pain, difficulty breathing, lower extremity swelling, or difficulty urinating. Past Medical History:  Diagnosis Date  . Allergy   . Asthma   . Benign breast lumps    knows breast lumps, told scar tissue from implants  . BREAST MASS, BENIGN 03/12/2010  . CAD (coronary artery disease) 2004   s/p stent [[place]]  . CAP (community acquired pneumonia) 11/10/2015  . COPD (chronic obstructive pulmonary disease) ([place]) 2010   FVC 49%, FEV1 39%, ratio 0.59, not reversible w SABA (05/2008)  . CPPD (pseudo-gout)   . Diet-controlled diabetes mellitus ([place]) since 2012   diet controlled  . DJD (degenerative joint disease) of lumbar spine   . Exertional shortness of breath   . Gout    [place]  . Hyperlipidemia   . Hypertension   . Mass of left submandibular region 2015   s/p aspiration then excision by ENT - squamous cell ? spread from lip  SqCCa rec pt CT 05/2015  . Myocardial infarction  2004   "right before I had stent put in"   . OA (osteoarthritis)    of hands  . On home oxygen therapy    "4L; just at night" (11/10/2015)  . Osteoporosis 2016   T -2.7 spine, -2.6 hip  . PAC (premature atrial contraction)    chronic  . Parainfluenza virus pneumonia 11/14/2015  . Smoker    1/2 ppd  . Squamous cell cancer of lip 2014   L lower lip s/p excision  . Urge incontinence 2008   s/p urodynamics [[place]]    Patient Active Problem List   Diagnosis Date Noted  . Parainfluenza virus pneumonia 11/14/2015  . PAD (peripheral artery disease) ([place]) 03/25/2015  . Vitamin D deficiency 10/18/2014  . Osteoporosis   . Advanced care planning/counseling discussion 04/16/2014  . Health maintenance examination 04/16/2014  . Loss of weight 04/16/2014  . History of squamous cell carcinoma excision 09/27/2013  . COPD exacerbation ([place]) 08/21/2012  . Diabetic peripheral vascular disease ([place]) 06/15/2012  . PAC (premature atrial contraction)   . Medicare annual wellness visit, subsequent 02/24/2012  . Insomnia 03/16/2011  . CAD (coronary artery disease) 11/03/2010  . Diabetes mellitus type 2, controlled, with complications ([place]) [id number]  . HLD (hyperlipidemia) 12/16/2009  . Gout 12/16/2009  . Smoker 12/16/2009  . Essential hypertension 12/16/2009  . COPD (chronic obstructive pulmonary disease) ([place]) 12/16/2009    Past Surgical History:  Procedure Laterality Date  . AUGMENTATION MAMMAPLASTY  Bilateral 1970's  . BASAL CELL CARCINOMA EXCISION Left 2014   "lower lip"  . CATARACT EXTRACTION W/ INTRAOCULAR LENS  IMPLANT, BILATERAL Bilateral 2010  . COLONOSCOPY  09/2005   tortuous colon, hem o/w WNL ([place])  . CORONARY ANGIOPLASTY WITH STENT PLACEMENT  2004   "1"   . INNER EAR SURGERY Bilateral    "had one surgery on each ear for hearing loss:  prosthesis implanted in right ear; metal piece placed in left ear"  . RADICAL NECK  DISSECTION Left 09/27/2013   Procedure: LEFT SUPRAHYOID DISSECTION;  Surgeon: Melissa Montane, MD - LN with metastatic SCC unknown primary  . TONSILLECTOMY  1950's    OB History    No data available       Home Medications    Prior to Admission medications   Medication Sig Start Date End Date Taking? Authorizing Provider  albuterol (PROVENTIL HFA;VENTOLIN HFA) 108 (90 Base) MCG/ACT inhaler Inhale 2 puffs into the lungs every 6 (six) hours as needed for wheezing or shortness of breath. 02/12/15   [name], MD  alendronate (FOSAMAX) 70 MG tablet Take 1 tablet (70 mg total) by mouth every 7 (seven) days. Take with a full glass of water on an empty stomach. Patient taking differently: Take 70 mg by mouth every Friday. Take with a full glass of water on an empty stomach. 06/19/15   [name], MD  aspirin 81 MG tablet Take 81 mg by mouth daily.      Historical Provider, MD  Calcium Carb-Cholecalciferol (CALCIUM-VITAMIN D) 600-400 MG-UNIT TABS Take 1 tablet by mouth daily.    Historical Provider, MD  guaiFENesin (MUCINEX) 600 MG 12 hr tablet Take 1 tablet (600 mg total) by mouth 2 (two) times daily. 11/12/15   Mariel Aloe, MD  ibuprofen (ADVIL,MOTRIN) 400 MG tablet Take 400 mg by mouth every 6 (six) hours as needed for moderate pain.    Historical Provider, MD  ipratropium-albuterol (DUONEB) 0.5-2.5 (3) MG/3ML SOLN Use every 4 hours for 48 hours, then use every 4 hours as needed for wheezing or shortness of breath 11/12/15   Mariel Aloe, MD  metoprolol tartrate (LOPRESSOR) 25 MG tablet TAKE ONE-HALF TABLET  IN  THE MORNING AND ONE-HALF  TABLET IN THE EVENING. 10/30/15   [name], MD  Omega-3 Fatty Acids (FISH OIL) 1000 MG CPDR Take 1 capsule by mouth daily.    Historical Provider, MD  simvastatin (ZOCOR) 20 MG tablet Take 1 tablet by mouth at  bedtime 10/30/15   [name], MD  [facility] HANDIHALER 18 MCG inhalation capsule Inhale the contents of 1  capsule via HandiHaler  daily  10/02/15   [name], MD  [facility] 160-4.5 MCG/ACT inhaler Use 2 puffs daily Patient taking differently: 2 puffs twice daily 07/25/15   [name], MD  vitamin B-12 (CYANOCOBALAMIN) 1000 MCG tablet Take 1,000 mcg by mouth daily.    Historical Provider, MD    Family History Family History  Problem Relation Age of Onset  . Heart disease Father 1    CAD AND HEART ATTACK  . Other Mother     old age, age 54  . Heart disease Mother     Social History Social History  Substance Use Topics  . Smoking status: Current Every Day Smoker    Packs/day: 0.75    Years: 60.00    Types: Cigarettes  . Smokeless tobacco: Never Used  . Alcohol use Yes     Comment: 11/10/2015  "maybe a glass  of wine twice/yr"     Allergies   Augmentin [amoxicillin-pot clavulanate]; Chantix [varenicline]; and Lisinopril   Review of Systems Review of Systems 10/14 systems reviewed and are negative other than those stated in the HPI    Physical Exam Updated Vital Signs BP 171/86 (BP Location: Right Arm)   Pulse 82   Temp 98 F (36.7 C) (Oral)   Resp 16   Ht 5\' 2"  (1.575 m)   Wt 116 lb (52.6 kg)   SpO2 97%   BMI 21.22 kg/m   Physical Exam Physical Exam  Nursing note and vitals reviewed. Constitutional: Well developed, well nourished, non-toxic, and in no acute distress Head: Normocephalic and atraumatic.  Mouth/Throat: Oropharynx is clear and moist.  Neck: Normal range of motion. Neck supple.  Cardiovascular: Normal rate and regular rhythm.   Pulmonary/Chest: Effort normal and breath sounds normal.  Abdominal: Soft. There is no tenderness. There is no rebound and no guarding.  Musculoskeletal: Normal range of motion.  Neurological: Alert, no facial droop, fluent speech, moves all extremities symmetrically, PERRL, EOMI, sensation to light touch in tact throughout Skin: Skin is warm and dry.  Psychiatric: Cooperative   ED Treatments / Results  Labs (all labs ordered are listed,  but only abnormal results are displayed) Labs Reviewed - No data to display  EKG  EKG Interpretation None       Radiology No results found.  Procedures Procedures (including critical care time)  Medications Ordered in ED Medications - No data to display   Initial Impression / Assessment and Plan / ED Course  I have reviewed the triage vital signs and the nursing notes.  Pertinent labs & imaging results that were available during my care of the patient were reviewed by me and considered in my medical decision making (see chart for details).  Clinical Course    Recently discontinued on 2 blood pressure medications in the setting of pneumonia, which is now resolved. She is hypertensive with systolic blood pressures between 150s to 170s over 80s here in the ED. She is asymptomatic. Exam is otherwise unremarkable. She sees her primary care doctor tomorrow, which I feel is safe for her to discuss which medications to restart then. She requested that she restart one of her medications today given persistent elevation. I told her to take one dose of home 5 mg amlodipine when she gets home and see her PCP tomorrow to discuss further medication changes.   Strict return and follow-up instructions reviewed. She expressed understanding of all discharge instructions and felt comfortable with the plan of care.  Final Clinical Impressions(s) / ED Diagnoses   Final diagnoses:  Secondary hypertension    New Prescriptions New Prescriptions   No medications on file     Forde Dandy, MD 12/10/15 1304

## 2015-12-10 NOTE — Telephone Encounter (Signed)
See latest AVS - did she first try to restart amlodipine?  If no hypertensive symptoms, should be ok to manage out patient.

## 2015-12-10 NOTE — Discharge Instructions (Signed)
When you go home you can take one dose of your 5 mg amlodipine (NORVASC).  Please see your primary care doctor tomorrow as scheduled to discuss which medication to restart and continue.  Please return without fail for worsening symptoms, including headaches, confusion, intractable vomiting, new numbness or weakness, chest pain or difficulty breathing, or any other symptoms concerning to you.  Your blood pressures while you were in the ED today are: 171/86 157/77

## 2015-12-10 NOTE — ED Notes (Addendum)
Pt states she went by fire station and had he bp checked and it as 180/110, has been off her 2nd bp med  ( still on toprol) since she was hospitalized for pneu  the first of oct. States she has had some pressure behind her eyes yesterday  and she was a little dizzy, pt states she does have appointment tomorrow with her pcp but feels she needs to start med back because ems at firestation told her  Her bp was dangerous high

## 2015-12-10 NOTE — Telephone Encounter (Signed)
Spoke with patient. She never restarted amlodipine because she didn't have a way to monitor BP to see what it was. When she went to [place] yesterday and today and they told her it was high both days and then today told her she should go to the ER, she did. She said the ER doctor advised to go ahead and restart amlodipine and follow up with you tomorrow.   She is also going to [place] next week and requests a letter from you that will allow to have a wheelchair and if you believe it's necessary her nebulizer to get through customs.

## 2015-12-11 ENCOUNTER — Encounter: Payer: Self-pay | Admitting: Family Medicine

## 2015-12-11 ENCOUNTER — Ambulatory Visit (INDEPENDENT_AMBULATORY_CARE_PROVIDER_SITE_OTHER): Payer: Medicare Other | Admitting: Family Medicine

## 2015-12-11 VITALS — BP 148/82 | HR 80 | Temp 98.2°F | Wt 112.5 lb

## 2015-12-11 DIAGNOSIS — Z87891 Personal history of nicotine dependence: Secondary | ICD-10-CM | POA: Diagnosis not present

## 2015-12-11 DIAGNOSIS — I10 Essential (primary) hypertension: Secondary | ICD-10-CM

## 2015-12-11 DIAGNOSIS — J441 Chronic obstructive pulmonary disease with (acute) exacerbation: Secondary | ICD-10-CM | POA: Diagnosis not present

## 2015-12-11 MED ORDER — PREDNISONE 20 MG PO TABS: ORAL_TABLET | ORAL | 0 refills | Status: DC

## 2015-12-11 NOTE — Progress Notes (Signed)
BP (!) 148/82 Comment: 130/82 with auto cuff  Pulse 80   Temp 98.2 F (36.8 C) (Oral)   Wt 112 lb 8 oz (51 kg)   SpO2 94%   BMI 20.58 kg/m    CC: ER f/u visit Subjective:    Patient ID: Desiree Floyd, female    DOB: 16-Dec-1932, 82 y.o.   MRN: [id number]  HPI: Desiree Floyd is a 82 y.o. female presenting on 12/11/2015 for Follow-up   See prior note for details. Recent hospitalization last month for parainfluenza pneumonia, antihypertensives were discontinued except beta blocker due to hypotension in hospital. Over the last few weeks blood pressures started trending up. I had asked her to monitor and if increasing to restart amlodipine. She checked at local fire station 2 days in a row with blood pressures 99991111 systolic. She got worried and decided to go to ER for further evaluation - amlodipine was restarted. Denies HA, vision changes, CP/tightness, SOB, leg swelling.   Upcoming trip to [place]. Grandson lives in [place]. Has requested letter for nebulizer machine and wheelchair use - provided  Relevant past medical, surgical, family and social history reviewed and updated as indicated. Interim medical history since our last visit reviewed. Allergies and medications reviewed and updated. Current Outpatient Prescriptions on File Prior to Visit  Medication Sig  . albuterol (PROVENTIL HFA;VENTOLIN HFA) 108 (90 Base) MCG/ACT inhaler Inhale 2 puffs into the lungs every 6 (six) hours as needed for wheezing or shortness of breath.  Marland Kitchen alendronate (FOSAMAX) 70 MG tablet Take 1 tablet (70 mg total) by mouth every 7 (seven) days. Take with a full glass of water on an empty stomach. (Patient taking differently: Take 70 mg by mouth every Friday. Take with a full glass of water on an empty stomach.)  . aspirin 81 MG tablet Take 81 mg by mouth daily.    . Calcium Carb-Cholecalciferol (CALCIUM-VITAMIN D) 600-400 MG-UNIT TABS Take 1 tablet by mouth daily.  Marland Kitchen ibuprofen (ADVIL,MOTRIN) 400  MG tablet Take 400 mg by mouth every 6 (six) hours as needed for moderate pain.  Marland Kitchen ipratropium-albuterol (DUONEB) 0.5-2.5 (3) MG/3ML SOLN Use every 4 hours for 48 hours, then use every 4 hours as needed for wheezing or shortness of breath  . metoprolol tartrate (LOPRESSOR) 25 MG tablet TAKE ONE-HALF TABLET  IN  THE MORNING AND ONE-HALF  TABLET IN THE EVENING.  . Omega-3 Fatty Acids (FISH OIL) 1000 MG CPDR Take 1 capsule by mouth daily.  . simvastatin (ZOCOR) 20 MG tablet Take 1 tablet by mouth at  bedtime  . SPIRIVA HANDIHALER 18 MCG inhalation capsule Inhale the contents of 1  capsule via HandiHaler  daily  . SYMBICORT 160-4.5 MCG/ACT inhaler Use 2 puffs daily (Patient taking differently: 2 puffs twice daily)  . vitamin B-12 (CYANOCOBALAMIN) 1000 MCG tablet Take 1,000 mcg by mouth daily.  Marland Kitchen guaiFENesin (MUCINEX) 600 MG 12 hr tablet Take 1 tablet (600 mg total) by mouth 2 (two) times daily. (Patient not taking: Reported on 12/11/2015)   No current facility-administered medications on file prior to visit.     Review of Systems Per HPI unless specifically indicated in ROS section     Objective:    BP (!) 148/82 Comment: 130/82 with auto cuff  Pulse 80   Temp 98.2 F (36.8 C) (Oral)   Wt 112 lb 8 oz (51 kg)   SpO2 94%   BMI 20.58 kg/m   Wt Readings from Last 3 Encounters:  12/11/15 112 lb 8 oz (51 kg)  12/10/15 116 lb (52.6 kg)  11/14/15 116 lb (52.6 kg)    Physical Exam  Constitutional: She appears well-developed and well-nourished. No distress.  HENT:  Mouth/Throat: Oropharynx is clear and moist. No oropharyngeal exudate.  Eyes: Conjunctivae and EOM are normal. Pupils are equal, round, and reactive to light. No scleral icterus.  Cardiovascular: Normal rate, regular rhythm, normal heart sounds and intact distal pulses.   No murmur heard. Pulmonary/Chest: Effort normal. No respiratory distress. She has decreased breath sounds. She has wheezes (faint). She has no rhonchi. She has no  rales.  Coarse throughout  Musculoskeletal: She exhibits no edema.  Skin: Skin is warm and dry. No rash noted.  Nursing note and vitals reviewed.     Assessment & Plan:   Problem List Items Addressed This Visit    COPD exacerbation (HCC)    Coarse breath sounds throughout with ongoing dyspnea and some ongoing productive cough. Anticipate mild COPD exac. Upcoming international trip. Will provide prednisone Rx to take for air travel - sent to pharmacy.       Relevant Medications   predniSONE (DELTASONE) 20 MG tablet   Essential hypertension - Primary    Continue metoprolol. Restart amlodipine 5mg  daily - bp better controlled today. Discussed possible increase in amlodipine to 10mg  daily if bp remaining >150/90. Pt agrees with plan. Will stay off maxzide for now.      Relevant Medications   amLODipine (NORVASC) 5 MG tablet   Ex-smoker    Remains abstinent. Quit 11/10/2015       Other Visit Diagnoses   None.      Follow up plan: No Follow-up on file.  [name], MD

## 2015-12-11 NOTE — Telephone Encounter (Signed)
Letter written and in chart 

## 2015-12-11 NOTE — Assessment & Plan Note (Signed)
Remains abstinent. Quit 11/10/2015

## 2015-12-11 NOTE — Assessment & Plan Note (Signed)
Coarse breath sounds throughout with ongoing dyspnea and some ongoing productive cough. Anticipate mild COPD exac. Upcoming international trip. Will provide prednisone Rx to take for air travel - sent to pharmacy.

## 2015-12-11 NOTE — Progress Notes (Signed)
Pre visit review using our clinic review tool, if applicable. No additional management support is needed unless otherwise documented below in the visit note. 

## 2015-12-11 NOTE — Assessment & Plan Note (Addendum)
Continue metoprolol. Restart amlodipine 5mg  daily - bp better controlled today. Discussed possible increase in amlodipine to 10mg  daily if bp remaining >150/90. Pt agrees with plan. Will stay off maxzide for now.

## 2015-12-11 NOTE — Patient Instructions (Addendum)
Continue metoprolol and norvasc 5mg  daily. If blood pressures consistently >150/90, start taking norvasc 10mg  (2) daily and let me know.  Take short prednisone taper - start on the 8th.  Good to see you today. Have a safe trip to [place]! Remember avoid too much time in the heat.

## 2015-12-13 DIAGNOSIS — J189 Pneumonia, unspecified organism: Secondary | ICD-10-CM | POA: Diagnosis not present

## 2015-12-19 ENCOUNTER — Ambulatory Visit: Payer: Medicare Other | Admitting: Family Medicine

## 2015-12-20 DIAGNOSIS — J441 Chronic obstructive pulmonary disease with (acute) exacerbation: Secondary | ICD-10-CM | POA: Diagnosis not present

## 2015-12-29 ENCOUNTER — Encounter: Payer: Self-pay | Admitting: Internal Medicine

## 2015-12-29 ENCOUNTER — Telehealth: Payer: Self-pay

## 2015-12-29 ENCOUNTER — Ambulatory Visit (INDEPENDENT_AMBULATORY_CARE_PROVIDER_SITE_OTHER): Payer: Self-pay | Admitting: Internal Medicine

## 2015-12-29 VITALS — BP 134/68 | HR 59 | Temp 98.5°F | Wt 111.5 lb

## 2015-12-29 DIAGNOSIS — S60519A Abrasion of unspecified hand, initial encounter: Secondary | ICD-10-CM

## 2015-12-29 DIAGNOSIS — Z4802 Encounter for removal of sutures: Secondary | ICD-10-CM

## 2015-12-29 NOTE — Progress Notes (Signed)
Subjective:    Patient ID: Desiree Floyd, female    DOB: 14-Jul-1932, 82 y.o.   MRN: [id number]  HPI   Pt presents to the clinic today for suture removal. She report she fell 11/10, while vacationing in [place]. She went to the ER, where they put 5 stitches in her upper lip, and 2 stitches on her left middle finger. She also reports abrasions to bilateral palms. She has not changed the bandages on her hands since she left [place]. She denies fever, chills or body aches.   Review of Systems      Past Medical History:  Diagnosis Date  . Allergy   . Asthma   . Benign breast lumps    knows breast lumps, told scar tissue from implants  . BREAST MASS, BENIGN 03/12/2010  . CAD (coronary artery disease) 2004   s/p stent [[place]]  . CAP (community acquired pneumonia) 11/10/2015  . COPD (chronic obstructive pulmonary disease) ([place]) 2010   FVC 49%, FEV1 39%, ratio 0.59, not reversible w SABA (05/2008)  . CPPD (pseudo-gout)   . Diet-controlled diabetes mellitus ([place]) since 2012   diet controlled  . DJD (degenerative joint disease) of lumbar spine   . Exertional shortness of breath   . Gout    [place]  . Hyperlipidemia   . Hypertension   . Mass of left submandibular region 2015   s/p aspiration then excision by ENT - squamous cell ? spread from lip SqCCa rec pt CT 05/2015  . Myocardial infarction  2004   "right before I had stent put in"   . OA (osteoarthritis)    of hands  . On home oxygen therapy    "4L; just at night" (11/10/2015)  . Osteoporosis 2016   T -2.7 spine, -2.6 hip  . PAC (premature atrial contraction)    chronic  . Parainfluenza virus pneumonia 11/14/2015  . Smoker    1/2 ppd  . Squamous cell cancer of lip 2014   L lower lip s/p excision  . Urge incontinence 2008   s/p urodynamics [[place]]    Current Outpatient Prescriptions  Medication Sig Dispense Refill  . albuterol (PROVENTIL HFA;VENTOLIN HFA) 108 (90 Base) MCG/ACT inhaler Inhale 2 puffs  into the lungs every 6 (six) hours as needed for wheezing or shortness of breath. 1 Inhaler 1  . alendronate (FOSAMAX) 70 MG tablet Take 1 tablet (70 mg total) by mouth every 7 (seven) days. Take with a full glass of water on an empty stomach. (Patient taking differently: Take 70 mg by mouth every Friday. Take with a full glass of water on an empty stomach.) 12 tablet 3  . amLODipine (NORVASC) 5 MG tablet Take 5 mg by mouth daily.    Marland Kitchen aspirin 81 MG tablet Take 81 mg by mouth daily.      . Calcium Carb-Cholecalciferol (CALCIUM-VITAMIN D) 600-400 MG-UNIT TABS Take 1 tablet by mouth daily.    Marland Kitchen ibuprofen (ADVIL,MOTRIN) 400 MG tablet Take 400 mg by mouth every 6 (six) hours as needed for moderate pain.    Marland Kitchen ipratropium-albuterol (DUONEB) 0.5-2.5 (3) MG/3ML SOLN Use every 4 hours for 48 hours, then use every 4 hours as needed for wheezing or shortness of breath 360 mL 0  . metoprolol tartrate (LOPRESSOR) 25 MG tablet TAKE ONE-HALF TABLET  IN  THE MORNING AND ONE-HALF  TABLET IN THE EVENING. 90 tablet 1  . Omega-3 Fatty Acids (FISH OIL) 1000 MG CPDR Take 1 capsule by mouth daily.    Marland Kitchen  simvastatin (ZOCOR) 20 MG tablet Take 1 tablet by mouth at  bedtime 90 tablet 1  . SPIRIVA HANDIHALER 18 MCG inhalation capsule Inhale the contents of 1  capsule via HandiHaler  daily 90 capsule 1  . SYMBICORT 160-4.5 MCG/ACT inhaler Use 2 puffs daily (Patient taking differently: 2 puffs twice daily) 20.4 g 6  . vitamin B-12 (CYANOCOBALAMIN) 1000 MCG tablet Take 1,000 mcg by mouth daily.     No current facility-administered medications for this visit.     Allergies  Allergen Reactions  . Augmentin [Amoxicillin-Pot Clavulanate] Other (See Comments)    Diarrhea and vomiting  . Chantix [Varenicline] Other (See Comments)    Bad dreams  . Lisinopril Cough    Family History  Problem Relation Age of Onset  . Heart disease Father 54    CAD AND HEART ATTACK  . Other Mother     old age, age 73  . Heart disease Mother       Social History   Social History  . Marital status: Married    Spouse name: N/A  . Number of children: 2  . Years of education: N/A   Occupational History  . [employer] Retired   Social History Main Topics  . Smoking status: Current Every Day Smoker    Packs/day: 0.75    Years: 60.00    Types: Cigarettes  . Smokeless tobacco: Never Used  . Alcohol use Yes     Comment: 11/10/2015  "maybe a glass of wine twice/yr"  . Drug use: No  . Sexual activity: No   Other Topics Concern  . Not on file   Social History Narrative   Decaf coffee   Lives with husband and no pets   Children nearby   Occupation: retired, worked for school in cafeteria   Activity: no regular exercise, wants to start walking   Diet: good water, fruits/vegetables daily     Constitutional: Denies fever, malaise, fatigue, headache or abrupt weight changes.  Skin: Pt reports stitches to upper lip, left middle finger and abrasions to bilateral palms. Denies redness, rashes, lesions or ulcercations.   No other specific complaints in a complete review of systems (except as listed in HPI above).  Objective:   Physical Exam   BP 134/68   Pulse (!) 59   Temp 98.5 F (36.9 C) (Oral)   Wt 111 lb 8 oz (50.6 kg)   SpO2 96%   BMI 20.39 kg/m  Wt Readings from Last 3 Encounters:  12/29/15 111 lb 8 oz (50.6 kg)  12/11/15 112 lb 8 oz (51 kg)  12/10/15 116 lb (52.6 kg)    General: Appears her stated age, well developed, well nourished in NAD. Skin: 5 stiches noted to upper lip, 2 stitches in left middle finger. 2 cm open areas noted on bilateral palms.    BMET    Component Value Date/Time   NA 140 11/12/2015 0531   K 4.0 11/12/2015 0531   CL 106 11/12/2015 0531   CO2 28 11/12/2015 0531   GLUCOSE 99 11/12/2015 0531   BUN 12 11/12/2015 0531   CREATININE 0.70 11/12/2015 0531   CALCIUM 7.8 (L) 11/12/2015 0531   GFRNONAA >60 11/12/2015 0531   GFRAA >60 11/12/2015 0531     Lipid Panel     Component Value Date/Time   CHOL 126 04/14/2015 1340   TRIG 105.0 04/14/2015 1340   HDL 53.80 04/14/2015 1340   CHOLHDL 2 04/14/2015 1340   VLDL 21.0  04/14/2015 1340   LDLCALC 52 04/14/2015 1340    CBC    Component Value Date/Time   WBC 4.9 11/11/2015 0516   RBC 4.14 11/11/2015 0516   HGB 12.5 11/11/2015 0516   HCT 38.1 11/11/2015 0516   PLT 182 11/11/2015 0516   MCV 92.0 11/11/2015 0516   MCH 30.2 11/11/2015 0516   MCHC 32.8 11/11/2015 0516   RDW 12.6 11/11/2015 0516   LYMPHSABS 2.9 04/14/2015 1340   MONOABS 1.3 (H) 04/14/2015 1340   EOSABS 0.2 04/14/2015 1340   BASOSABS 0.0 04/14/2015 1340    Hgb A1C Lab Results  Component Value Date   HGBA1C 6.7 (H) 11/10/2015           Assessment & Plan:   Abrasions to bilateral palms, s/p fall:  Wounds cleansed with sterile water Applied Neosporin and bandaids Advised her to change daily Return precautions discussed  Encounter for stitch removal:  5 stitches removed from upper lip 2 stitches removed from left middle finger Aftercare instructions given  RTC as needed or if symptoms persist or worsen Lindell Renfrew, NP d

## 2015-12-29 NOTE — Patient Instructions (Signed)
Suture Removal, Care After Refer to this sheet in the next few weeks. These instructions provide you with information on caring for yourself after your procedure. Your health care provider may also give you more specific instructions. Your treatment has been planned according to current medical practices, but problems sometimes occur. Call your health care provider if you have any problems or questions after your procedure. What can I expect after the procedure? After your stitches (sutures) are removed, it is typical to have the following:  Some discomfort and swelling in the wound area.  Slight redness in the area.  Follow these instructions at home:  If you have skin adhesive strips over the wound area, do not take the strips off. They will fall off on their own in a few days. If the strips remain in place after 14 days, you may remove them.  Change any bandages (dressings) at least once a day or as directed by your health care provider. If the bandage sticks, soak it off with warm, soapy water.  Apply cream or ointment only as directed by your health care provider. If using cream or ointment, wash the area with soap and water 2 times a day to remove all the cream or ointment. Rinse off the soap and pat the area dry with a clean towel.  Keep the wound area dry and clean. If the bandage becomes wet or dirty, or if it develops a bad smell, change it as soon as possible.  Continue to protect the wound from injury.  Use sunscreen when out in the sun. New scars become sunburned easily. Contact a health care provider if:  You have increasing redness, swelling, or pain in the wound.  You see pus coming from the wound.  You have a fever.  You notice a bad smell coming from the wound or dressing.  Your wound breaks open (edges not staying together). This information is not intended to replace advice given to you by your health care provider. Make sure you discuss any questions you have  with your health care provider. Document Released: 10/20/2000 Document Revised: 07/03/2015 Document Reviewed: 09/06/2012 Elsevier Interactive Patient Education  2017 Elsevier Inc.  

## 2015-12-29 NOTE — Telephone Encounter (Signed)
Pt left v/m requesting an order to do a discharge order for pts oxygen and have the oxygen picked up ASAP. Pt has not used oxygen since the first of July; request discharge order for oxygen faxed to [phone].

## 2015-12-30 NOTE — Telephone Encounter (Signed)
Pt left v/m requesting cb about status of having oxygen equipment removed. Pt wants to have done prior to Thanksgiving.

## 2015-12-30 NOTE — Telephone Encounter (Signed)
Ok to d/c oxygen - may fax this phone note. Let me know if we need to do anything else.

## 2015-12-30 NOTE — Telephone Encounter (Signed)
FAXED

## 2016-01-08 DIAGNOSIS — D0462 Carcinoma in situ of skin of left upper limb, including shoulder: Secondary | ICD-10-CM | POA: Diagnosis not present

## 2016-01-08 DIAGNOSIS — L570 Actinic keratosis: Secondary | ICD-10-CM | POA: Diagnosis not present

## 2016-01-08 DIAGNOSIS — X32XXXD Exposure to sunlight, subsequent encounter: Secondary | ICD-10-CM | POA: Diagnosis not present

## 2016-01-08 DIAGNOSIS — C44622 Squamous cell carcinoma of skin of right upper limb, including shoulder: Secondary | ICD-10-CM | POA: Diagnosis not present

## 2016-01-12 ENCOUNTER — Ambulatory Visit: Payer: Medicare Other | Admitting: Family Medicine

## 2016-01-12 DIAGNOSIS — J189 Pneumonia, unspecified organism: Secondary | ICD-10-CM | POA: Diagnosis not present

## 2016-01-22 ENCOUNTER — Telehealth: Payer: Self-pay

## 2016-01-22 NOTE — Telephone Encounter (Signed)
Pt last seen 12/11/15 and pt wants to know when Dr Darnell Level wants to see pt again for f/u. No new symptoms to report.

## 2016-01-22 NOTE — Telephone Encounter (Signed)
Patient notified and appts scheduled

## 2016-01-22 NOTE — Telephone Encounter (Signed)
If doing well and bp well controlled, may schedule physical for after 04/21/2016. thanks

## 2016-02-11 ENCOUNTER — Other Ambulatory Visit: Payer: Self-pay | Admitting: *Deleted

## 2016-02-11 ENCOUNTER — Other Ambulatory Visit: Payer: Self-pay | Admitting: Family Medicine

## 2016-02-11 ENCOUNTER — Ambulatory Visit (INDEPENDENT_AMBULATORY_CARE_PROVIDER_SITE_OTHER): Payer: Medicare Other | Admitting: Family Medicine

## 2016-02-11 ENCOUNTER — Encounter: Payer: Self-pay | Admitting: Family Medicine

## 2016-02-11 VITALS — BP 118/76 | HR 76 | Temp 98.2°F | Wt 107.5 lb

## 2016-02-11 DIAGNOSIS — B370 Candidal stomatitis: Secondary | ICD-10-CM | POA: Diagnosis not present

## 2016-02-11 DIAGNOSIS — I499 Cardiac arrhythmia, unspecified: Secondary | ICD-10-CM

## 2016-02-11 DIAGNOSIS — R5381 Other malaise: Secondary | ICD-10-CM | POA: Diagnosis not present

## 2016-02-11 DIAGNOSIS — J441 Chronic obstructive pulmonary disease with (acute) exacerbation: Secondary | ICD-10-CM

## 2016-02-11 DIAGNOSIS — R634 Abnormal weight loss: Secondary | ICD-10-CM

## 2016-02-11 DIAGNOSIS — F17200 Nicotine dependence, unspecified, uncomplicated: Secondary | ICD-10-CM

## 2016-02-11 DIAGNOSIS — R5383 Other fatigue: Secondary | ICD-10-CM | POA: Diagnosis not present

## 2016-02-11 LAB — MAGNESIUM: MAGNESIUM: 1.8 mg/dL (ref 1.5–2.5)

## 2016-02-11 LAB — CBC WITH DIFFERENTIAL/PLATELET
BASOS ABS: 0.1 10*3/uL (ref 0.0–0.1)
Basophils Relative: 0.4 % (ref 0.0–3.0)
EOS ABS: 0.1 10*3/uL (ref 0.0–0.7)
Eosinophils Relative: 0.4 % (ref 0.0–5.0)
HCT: 35.5 % — ABNORMAL LOW (ref 36.0–46.0)
Hemoglobin: 11.7 g/dL — ABNORMAL LOW (ref 12.0–15.0)
LYMPHS ABS: 1.8 10*3/uL (ref 0.7–4.0)
LYMPHS PCT: 13.9 % (ref 12.0–46.0)
MCHC: 33.1 g/dL (ref 30.0–36.0)
MCV: 85 fl (ref 78.0–100.0)
Monocytes Absolute: 1.5 10*3/uL — ABNORMAL HIGH (ref 0.1–1.0)
Monocytes Relative: 11.6 % (ref 3.0–12.0)
NEUTROS ABS: 9.4 10*3/uL — AB (ref 1.4–7.7)
NEUTROS PCT: 73.7 % (ref 43.0–77.0)
PLATELETS: 370 10*3/uL (ref 150.0–400.0)
RBC: 4.18 Mil/uL (ref 3.87–5.11)
RDW: 14.9 % (ref 11.5–15.5)
WBC: 12.8 10*3/uL — ABNORMAL HIGH (ref 4.0–10.5)

## 2016-02-11 LAB — COMPREHENSIVE METABOLIC PANEL
ALT: 12 U/L (ref 0–35)
AST: 15 U/L (ref 0–37)
Albumin: 3.3 g/dL — ABNORMAL LOW (ref 3.5–5.2)
Alkaline Phosphatase: 54 U/L (ref 39–117)
BILIRUBIN TOTAL: 0.5 mg/dL (ref 0.2–1.2)
BUN: 9 mg/dL (ref 6–23)
CO2: 27 meq/L (ref 19–32)
CREATININE: 0.66 mg/dL (ref 0.40–1.20)
Calcium: 9.1 mg/dL (ref 8.4–10.5)
Chloride: 95 mEq/L — ABNORMAL LOW (ref 96–112)
GFR: 91.33 mL/min (ref >60.00)
GLUCOSE: 200 mg/dL — AB (ref 70–99)
Potassium: 2.9 mEq/L — ABNORMAL LOW (ref 3.5–5.1)
Sodium: 133 mEq/L — ABNORMAL LOW (ref 135–145)
Total Protein: 7.1 g/dL (ref 6.0–8.3)

## 2016-02-11 LAB — SEDIMENTATION RATE

## 2016-02-11 LAB — TSH: TSH: 1.05 u[IU]/mL (ref 0.35–4.50)

## 2016-02-11 LAB — VITAMIN D 25 HYDROXY (VIT D DEFICIENCY, FRACTURES): VITD: 40.52 ng/mL (ref 30.00–100.00)

## 2016-02-11 MED ORDER — NYSTATIN 100000 UNIT/ML MT SUSP: 5.0000 mL | Freq: Three times a day (TID) | OROMUCOSAL | 0 refills | Status: DC

## 2016-02-11 MED ORDER — IPRATROPIUM-ALBUTEROL 0.5-2.5 (3) MG/3ML IN SOLN: RESPIRATORY_TRACT | 0 refills | Status: DC

## 2016-02-11 MED ORDER — GLUCOSE BLOOD VI STRP: 1.0000 | ORAL_STRIP | 3 refills | Status: AC

## 2016-02-11 MED ORDER — METOPROLOL TARTRATE 25 MG PO TABS: 25.0000 mg | ORAL_TABLET | Freq: Two times a day (BID) | ORAL | 1 refills | Status: AC

## 2016-02-11 NOTE — Addendum Note (Signed)
Addended by: Royann Shivers A on: 02/11/2016 11:08 AM   Modules accepted: Orders

## 2016-02-11 NOTE — Assessment & Plan Note (Addendum)
Reports compliance with symbicort and spiriva. has restarted smoking - rec full cessation. duonebs refilled.

## 2016-02-11 NOTE — Assessment & Plan Note (Signed)
In setting of recent hospitalization with abx, as well as symbicort use (although she does rinse after use).

## 2016-02-11 NOTE — Addendum Note (Signed)
Addended by: Royann Shivers A on: 02/11/2016 10:57 AM   Modules accepted: Orders

## 2016-02-11 NOTE — Progress Notes (Addendum)
BP 118/76   Pulse 76   Temp 98.2 F (36.8 C) (Oral)   Wt 107 lb 8 oz (48.8 kg)   SpO2 95%   BMI 19.66 kg/m    CC: 2 wk neck pain, malaise Subjective:    Patient ID: Desiree Floyd, female    DOB: [birth year], 83 y.o.   MRN: [id number]  HPI: Desiree Floyd is a 83 y.o. female presenting on 02/11/2016 for Lack of energy (x 2 weeks) and Neck and shoulder pain (right side x 2 weeks (has been sleeping on 2 pillows and napping on couch a lot))   Hospitalized 11/2015 with parainfluenza pneumonia. Recent trip to [place] to visit grandson. Had a fall in [place] with resultant hand abrasions, lip and finger laceration s/p stitches. Healing well. Overall did feel well in [place].   Comes in today with several month history of weakness, fatigue that started since PNA hospitalization. Some weight loss noted due to decreased appetite, some tongue burning despite rinsing mouth after steroid inhaler. + night sweats twice a night. Denies fevers/chills, headache, dizziness, chest pain, abd pain, nausea/vomiting or bowel changes. No new rashes.   Sleeps on 2 pillows at baseline. Over last few weeks noticing lateral R neck pain with radiation down shoulder, not positional or aggravated with movement. No shooting pain, numbness, weakness of R arm. No posterior neck pain  Palm erythema for years without pruritis.  Hypocalcemia and hypomagnesemia on latest labwork.   Severe COPD - returned to smoking. Taking symbicort and spiriva.  Requests duoneb refill.   H/o L lower lip squamous cell cancer, h/o L radical neck dissection 09/2013 for metastatic squamous cell cancer (Dr [name]).   Relevant past medical, surgical, family and social history reviewed and updated as indicated. Interim medical history since our last visit reviewed. Allergies and medications reviewed and updated. Current Outpatient Prescriptions on File Prior to Visit  Medication Sig  . albuterol (PROVENTIL HFA;VENTOLIN HFA) 108  (90 Base) MCG/ACT inhaler Inhale 2 puffs into the lungs every 6 (six) hours as needed for wheezing or shortness of breath.  Marland Kitchen alendronate (FOSAMAX) 70 MG tablet Take 1 tablet (70 mg total) by mouth every 7 (seven) days. Take with a full glass of water on an empty stomach. (Patient taking differently: Take 70 mg by mouth every Friday. Take with a full glass of water on an empty stomach.)  . amLODipine (NORVASC) 5 MG tablet Take 5 mg by mouth daily.  Marland Kitchen aspirin 81 MG tablet Take 81 mg by mouth daily.    . Calcium Carb-Cholecalciferol (CALCIUM-VITAMIN D) 600-400 MG-UNIT TABS Take 1 tablet by mouth daily.  Marland Kitchen ibuprofen (ADVIL,MOTRIN) 400 MG tablet Take 400 mg by mouth every 6 (six) hours as needed for moderate pain.  . Omega-3 Fatty Acids (FISH OIL) 1000 MG CPDR Take 1 capsule by mouth daily.  . simvastatin (ZOCOR) 20 MG tablet Take 1 tablet by mouth at  bedtime  . SPIRIVA HANDIHALER 18 MCG inhalation capsule Inhale the contents of 1  capsule via HandiHaler  daily  . SYMBICORT 160-4.5 MCG/ACT inhaler Use 2 puffs daily (Patient taking differently: 2 puffs twice daily)  . vitamin B-12 (CYANOCOBALAMIN) 1000 MCG tablet Take 1,000 mcg by mouth daily.   No current facility-administered medications on file prior to visit.     Review of Systems Per HPI unless specifically indicated in ROS section     Objective:    BP 118/76   Pulse 76   Temp 98.2  F (36.8 C) (Oral)   Wt 107 lb 8 oz (48.8 kg)   SpO2 95%   BMI 19.66 kg/m   Wt Readings from Last 3 Encounters:  02/11/16 107 lb 8 oz (48.8 kg)  12/29/15 111 lb 8 oz (50.6 kg)  12/11/15 112 lb 8 oz (51 kg)    Physical Exam  Constitutional: She appears well-developed and well-nourished. No distress.  HENT:  Head: Normocephalic and atraumatic.  Mouth/Throat: No oropharyngeal exudate.  White patches posterior tongue  Eyes: Conjunctivae are normal. Pupils are equal, round, and reactive to light.  Neck: Normal range of motion. Neck supple. No  thyromegaly present.  Cardiovascular: Normal rate and intact distal pulses.  An irregularly irregular rhythm present.  No murmur heard. Pulmonary/Chest: Effort normal. No respiratory distress. She has decreased breath sounds. She has no wheezes. She has rhonchi (diffuse throughout). She has no rales.  Abdominal: Soft. Normal appearance and bowel sounds are normal. She exhibits no distension and no mass. There is no hepatosplenomegaly. There is no tenderness. There is no rigidity, no rebound, no guarding and negative Murphy's sign.  Musculoskeletal: She exhibits no edema.  FROM at neck without discomfort No midline cervical spine tenderness  Lymphadenopathy:    She has no cervical adenopathy.  Skin: Skin is warm and dry.  Psychiatric: She has a normal mood and affect.  Nursing note and vitals reviewed.   Lab Results  Component Value Date   HGBA1C 6.7 (H) 11/10/2015    EKG - sinus rhythm ~75 with intermittent atrial tachycardias, normal axis.    Assessment & Plan:   Problem List Items Addressed This Visit    COPD (chronic obstructive pulmonary disease) ([place])    Reports compliance with symbicort and spiriva. has restarted smoking - rec full cessation. duonebs refilled.      Relevant Medications   ipratropium-albuterol (DUONEB) 0.5-2.5 (3) MG/3ML SOLN   Hypocalcemia    Check CMP, TSH, PTH, vit D, Magnesium. Pt reports compliance with calcium vit D 600/400 daily.      Relevant Orders   TSH   Parathyroid hormone, intact (no Ca)   VITAMIN D 25 Hydroxy (Vit-D Deficiency, Fractures)   Magnesium   Irregular heart beat    New. Check EKG today. Likely intermittent atrial tachycardia - will refer to cards for further eval, possible holter.  Will increase metoprolol to 51m bid in interim.       Relevant Orders   EKG 12-Lead (Completed)   Ambulatory referral to Cardiology   Loss of weight   Malaise and fatigue - Primary    New over last 2 weeks with night sweats. Check CMP, CBC,  ESR. Noted recent possibly untreated hypocalcemia - rpt labs. Consider updated neck imaging (parathyroid gland eval)      Relevant Orders   TSH   Comprehensive metabolic panel   CBC with Differential/Platelet   Sedimentation rate   Oral thrush    In setting of recent hospitalization with abx, as well as symbicort use (although she does rinse after use).      Relevant Medications   nystatin (MYCOSTATIN) 100000 UNIT/ML suspension   Smoker    Has restarted - encouraged full cessation.           Follow up plan: Return if symptoms worsen or fail to improve.  JRia Bush MD

## 2016-02-11 NOTE — Assessment & Plan Note (Addendum)
Check CMP, TSH, PTH, vit D, Magnesium. Pt reports compliance with calcium vit D 600/400 daily.

## 2016-02-11 NOTE — Progress Notes (Signed)
Pre visit review using our clinic review tool, if applicable. No additional management support is needed unless otherwise documented below in the visit note. 

## 2016-02-11 NOTE — Assessment & Plan Note (Signed)
Has restarted - encouraged full cessation.

## 2016-02-11 NOTE — Assessment & Plan Note (Addendum)
New. Check EKG today. Likely intermittent atrial tachycardia - will refer to cards for further eval, possible holter.  Will increase metoprolol to 25mg  bid in interim.

## 2016-02-11 NOTE — Assessment & Plan Note (Addendum)
New over last 2 weeks with night sweats. Check CMP, CBC, ESR. Noted recent possibly untreated hypocalcemia - rpt labs. Consider updated neck imaging (parathyroid gland eval)

## 2016-02-11 NOTE — Patient Instructions (Addendum)
EKG today  Labs today Take nystatin swish (and swallow if tolerated) for thrush on tongue.  I recommend fluids and rest. We will be in touch with results and plan.

## 2016-02-11 NOTE — Addendum Note (Signed)
Addended by: [name] on: 02/11/2016 12:52 PM   Modules accepted: Orders

## 2016-02-12 ENCOUNTER — Other Ambulatory Visit: Payer: Self-pay | Admitting: Family Medicine

## 2016-02-12 DIAGNOSIS — J189 Pneumonia, unspecified organism: Secondary | ICD-10-CM | POA: Diagnosis not present

## 2016-02-12 DIAGNOSIS — E876 Hypokalemia: Secondary | ICD-10-CM

## 2016-02-12 LAB — PARATHYROID HORMONE, INTACT (NO CA): PTH: 25 pg/mL (ref 14–64)

## 2016-02-12 MED ORDER — POTASSIUM CHLORIDE CRYS ER 20 MEQ PO TBCR: 20.0000 meq | EXTENDED_RELEASE_TABLET | Freq: Two times a day (BID) | ORAL | 0 refills | Status: DC

## 2016-02-16 ENCOUNTER — Encounter (INDEPENDENT_AMBULATORY_CARE_PROVIDER_SITE_OTHER): Payer: Medicare Other

## 2016-02-16 ENCOUNTER — Ambulatory Visit (INDEPENDENT_AMBULATORY_CARE_PROVIDER_SITE_OTHER): Payer: Medicare Other | Admitting: Physician Assistant

## 2016-02-16 ENCOUNTER — Encounter: Payer: Self-pay | Admitting: Physician Assistant

## 2016-02-16 VITALS — BP 122/68 | HR 78 | Ht 62.0 in | Wt 107.4 lb

## 2016-02-16 DIAGNOSIS — I10 Essential (primary) hypertension: Secondary | ICD-10-CM | POA: Diagnosis not present

## 2016-02-16 DIAGNOSIS — R002 Palpitations: Secondary | ICD-10-CM | POA: Diagnosis not present

## 2016-02-16 DIAGNOSIS — I2510 Atherosclerotic heart disease of native coronary artery without angina pectoris: Secondary | ICD-10-CM

## 2016-02-16 DIAGNOSIS — F17200 Nicotine dependence, unspecified, uncomplicated: Secondary | ICD-10-CM

## 2016-02-16 DIAGNOSIS — I499 Cardiac arrhythmia, unspecified: Secondary | ICD-10-CM | POA: Diagnosis not present

## 2016-02-16 DIAGNOSIS — R5383 Other fatigue: Secondary | ICD-10-CM

## 2016-02-16 NOTE — Patient Instructions (Addendum)
Medication Instructions:  Your physician recommends that you continue on your current medications as directed. Please refer to the Current Medication list given to you today.  Labwork: NONE  Testing/Procedures: Your physician has recommended that you wear an event monitor. Event monitors are medical devices that record the heart's electrical activity. Doctors most often [place] these monitors to diagnose arrhythmias. Arrhythmias are problems with the speed or rhythm of the heartbeat. The monitor is a small, portable device. You can wear one while you do your normal daily activities. This is usually used to diagnose what is causing palpitations/syncope (passing out).  Your physician has requested that you have an echocardiogram. Echocardiography is a painless test that uses sound waves to create images of your heart. It provides your doctor with information about the size and shape of your heart and how well your heart's chambers and valves are working. This procedure takes approximately one hour. There are no restrictions for this procedure.  Follow-Up: WITH DR [place] 04/02/16 AT 4:15  If you need a refill on your cardiac medications before your next appointment, please call your pharmacy.

## 2016-02-16 NOTE — Progress Notes (Signed)
Cardiology Office Note   Date:  02/16/2016   ID:  [name], DOB May 11, 1932, MRN [id number]  PCP:  [name], MD  Cardiologist:  Dr Martinique 03/25/2015  Rosaria Ferries, PA-C   Chief Complaint  Patient presents with  . irregular heartbeat    per PCP    History of Present Illness: Desiree Floyd is a 83 y.o. female with a history of MI 2004 w/ DES CFX, nl MV 2013, DM, HTN, HLD, tob use, COPD, Home O2, mild PAD, ongoing tobacco use  02/11/2016, Dr Danise Mina saw, pt had irreg HR>>appt made, BB increased  Desiree Floyd presents for cardiology evaluation  [name] never has palpitations, she never has an awareness of heart skips or irregular beats.   Her breathing has been at baseline. Her O2 levels have improved so she is no longer on home O2. She uses inhalers and nebs. She has had oral thrush, getting better. She is not rinsing consistently after using inhalers or nebs. She has not had LE edema. With the oral thrush, eating was painful and food did not taste good. She has lost about 10 lbs.  She has been a little light-headed a couple of times. She does not remember if there was a position change or exertion before that.   She has been feeling tired, does not want to get out of bed. Her K+ has been low, 2.9 on 01/03. She has been supplementing it, for a recheck in am. She does not check her BP at home.   She has not had chest pain. She has not had any symptoms related to activity, has chronic DOE.    Past Medical History:  Diagnosis Date  . Allergy   . Asthma   . Benign breast lumps    knows breast lumps, told scar tissue from implants  . BREAST MASS, BENIGN 03/12/2010  . CAD (coronary artery disease) 2004   s/p stent CFX [[place]]  . CAP (community acquired pneumonia) 11/10/2015  . COPD (chronic obstructive pulmonary disease) ([place]) 2010   FVC 49%, FEV1 39%, ratio 0.59, not reversible w SABA (05/2008)  . CPPD (pseudo-gout)   . Diet-controlled diabetes  mellitus ([place]) since 2012   diet controlled  . DJD (degenerative joint disease) of lumbar spine   . Exertional shortness of breath   . Gout    [place]  . Hyperlipidemia   . Hypertension   . Mass of left submandibular region 2015   s/p aspiration then excision by ENT - squamous cell ? spread from lip SqCCa rec pt CT 05/2015  . Myocardial infarction 2004   DES CFX  . OA (osteoarthritis)    of hands  . On home oxygen therapy    "4L; just at night" (11/10/2015)  . Osteoporosis 2016   T -2.7 spine, -2.6 hip  . PAC (premature atrial contraction)    chronic  . Parainfluenza virus pneumonia 11/14/2015  . Smoker    1/2 ppd  . Squamous cell cancer of lip 2014   L lower lip s/p excision  . Urge incontinence 2008   s/p urodynamics [[place]]    Past Surgical History:  Procedure Laterality Date  . AUGMENTATION MAMMAPLASTY Bilateral 1970's  . BASAL CELL CARCINOMA EXCISION Left 2014   "lower lip"  . CATARACT EXTRACTION W/ INTRAOCULAR LENS  IMPLANT, BILATERAL Bilateral 2010  . COLONOSCOPY  09/2005   tortuous colon, hem o/w WNL ([place])  . CORONARY ANGIOPLASTY WITH STENT PLACEMENT  2004   "  1"   . INNER EAR SURGERY Bilateral    "had one surgery on each ear for hearing loss:  prosthesis implanted in right ear; metal piece placed in left ear"  . RADICAL NECK DISSECTION Left 09/27/2013   Procedure: LEFT SUPRAHYOID DISSECTION;  Surgeon: Melissa Montane, MD - LN with metastatic SCC unknown primary  . TONSILLECTOMY  1950's    Current Outpatient Prescriptions  Medication Sig Dispense Refill  . albuterol (PROVENTIL HFA;VENTOLIN HFA) 108 (90 Base) MCG/ACT inhaler Inhale 2 puffs into the lungs every 6 (six) hours as needed for wheezing or shortness of breath. 1 Inhaler 1  . alendronate (FOSAMAX) 70 MG tablet Take 1 tablet (70 mg total) by mouth every 7 (seven) days. Take with a full glass of water on an empty stomach. (Patient taking differently: Take 70 mg by mouth every Friday. Take with a full  glass of water on an empty stomach.) 12 tablet 3  . amLODipine (NORVASC) 5 MG tablet Take 5 mg by mouth daily.    Marland Kitchen aspirin 81 MG tablet Take 81 mg by mouth daily.      . Calcium Carb-Cholecalciferol (CALCIUM-VITAMIN D) 600-400 MG-UNIT TABS Take 1 tablet by mouth daily.    Marland Kitchen glucose blood test strip 1 each by Other route as directed. Use to check sugar once daily and as needed. Dx: E11.8 **ONE TOUCH ULTRA** 100 each 3  . ibuprofen (ADVIL,MOTRIN) 400 MG tablet Take 400 mg by mouth every 6 (six) hours as needed for moderate pain.    Marland Kitchen ipratropium-albuterol (DUONEB) 0.5-2.5 (3) MG/3ML SOLN Use every 4 hours for 48 hours, then use every 4 hours as needed for wheezing or shortness of breath 360 mL 0  . metoprolol tartrate (LOPRESSOR) 25 MG tablet Take 1 tablet (25 mg total) by mouth 2 (two) times daily. 180 tablet 1  . nystatin (MYCOSTATIN) 100000 UNIT/ML suspension Take 5 mLs (500,000 Units total) by mouth 3 (three) times daily. 200 mL 0  . Omega-3 Fatty Acids (FISH OIL) 1000 MG CPDR Take 1 capsule by mouth daily.    . potassium chloride SA (K-DUR,KLOR-CON) 20 MEQ tablet Take 1 tablet (20 mEq total) by mouth 2 (two) times daily. 30 tablet 0  . simvastatin (ZOCOR) 20 MG tablet Take 1 tablet by mouth at  bedtime 90 tablet 1  . SPIRIVA HANDIHALER 18 MCG inhalation capsule Inhale the contents of 1  capsule via HandiHaler  daily 90 capsule 1  . SYMBICORT 160-4.5 MCG/ACT inhaler Use 2 puffs daily (Patient taking differently: 2 puffs twice daily) 20.4 g 6  . vitamin B-12 (CYANOCOBALAMIN) 1000 MCG tablet Take 1,000 mcg by mouth daily.     No current facility-administered medications for this visit.     Allergies:   Augmentin [amoxicillin-pot clavulanate]; Chantix [varenicline]; and Lisinopril    Social History:  The patient  reports that she has been smoking Cigarettes.  She has a 45.00 pack-year smoking history. She has never used smokeless tobacco. She reports that she drinks alcohol. She reports that  she does not use drugs.   Family History:  The patient's family history includes Heart disease in her mother; Heart disease (age of onset: 47) in her father; Other in her mother.    ROS:  Please see the history of present illness. All other systems are reviewed and negative.    PHYSICAL EXAM: VS:  BP 122/68   Pulse 78   Ht 5\' 2"  (1.575 m)   Wt 107 lb 6.4 oz (48.7 kg)  BMI 19.64 kg/m  , BMI Body mass index is 19.64 kg/m. GEN: Well nourished, well developed, female in no acute distress  HEENT: normal for age  Neck: minimal JVD, no carotid bruit, no masses Cardiac: RRR; 2/6 murmur, no rubs, or gallops Respiratory: decreased BS bilaterally with some rales, normal work of breathing GI: soft, nontender, nondistended, + BS MS: no deformity or atrophy; no edema; distal pulses are 2+ in all 4 extremities   Skin: warm and dry, no rash Neuro:  Strength and sensation are intact Psych: euthymic mood, full affect   EKG:  EKG is ordered today. The ekg ordered today demonstrates SR, hr 78, SR with PACs   Recent Labs: 02/11/2016: ALT 12; BUN 9; Creatinine, Ser 0.66; Hemoglobin 11.7; Magnesium 1.8; Platelets 370.0; Potassium 2.9; Sodium 133; TSH 1.05    Lipid Panel    Component Value Date/Time   CHOL 126 04/14/2015 1340   TRIG 105.0 04/14/2015 1340   HDL 53.80 04/14/2015 1340   CHOLHDL 2 04/14/2015 1340   VLDL 21.0 04/14/2015 1340   LDLCALC 52 04/14/2015 1340   LDLDIRECT 54.6 12/13/2013 1141     Wt Readings from Last 3 Encounters:  02/16/16 107 lb 6.4 oz (48.7 kg)  02/11/16 107 lb 8 oz (48.8 kg)  12/29/15 111 lb 8 oz (50.6 kg)     Other studies Reviewed: Additional studies/ records that were reviewed today include: office notes, hospital records and testing.  ASSESSMENT AND PLAN:  1.  Irregular HR: She is not having any symptoms clearly attributed to the arrhythmia, but is at very high risk to have atrial fib. While checking her pulse, her heart was irregular at times. We will  get an event monitor and f/u on results.  2. Fatigue and Irreg HR: Pt has SEM, not listed in previous notes. Sound is suspicious for AS. Will ck echo  3. tob use: Advised cessation  4. CAD: No ischemic sx, on ASA, BB, Zocor  5. HTN: Baseline HR and BP are ok   Current medicines are reviewed at length with the patient today.  The patient does not have concerns regarding medicines.  The following changes have been made:  no change  Labs/ tests ordered today include:  No orders of the defined types were placed in this encounter.    Disposition:   FU with Dr Martinique  Signed, Lenoard Aden  02/16/2016 9:57 AM    [place] Phone: [phone]; Fax: [phone]  This note was written with the assistance of speech recognition software. Please excuse any transcriptional errors.

## 2016-02-17 ENCOUNTER — Other Ambulatory Visit (INDEPENDENT_AMBULATORY_CARE_PROVIDER_SITE_OTHER): Payer: Medicare Other

## 2016-02-17 DIAGNOSIS — E876 Hypokalemia: Secondary | ICD-10-CM | POA: Diagnosis not present

## 2016-02-17 LAB — POTASSIUM: Potassium: 4.2 mEq/L (ref 3.5–5.1)

## 2016-02-20 ENCOUNTER — Encounter: Payer: Self-pay | Admitting: *Deleted

## 2016-02-24 ENCOUNTER — Other Ambulatory Visit: Payer: Self-pay | Admitting: Family Medicine

## 2016-02-24 DIAGNOSIS — Z1231 Encounter for screening mammogram for malignant neoplasm of breast: Secondary | ICD-10-CM

## 2016-03-01 ENCOUNTER — Ambulatory Visit
Admission: RE | Admit: 2016-03-01 | Discharge: 2016-03-01 | Disposition: A | Discharge status: Home / Self Care | Payer: Medicare Other | Source: Ambulatory Visit | Attending: Family Medicine | Admitting: Family Medicine

## 2016-03-01 ENCOUNTER — Other Ambulatory Visit: Payer: Self-pay | Admitting: Family Medicine

## 2016-03-01 DIAGNOSIS — Z1231 Encounter for screening mammogram for malignant neoplasm of breast: Secondary | ICD-10-CM | POA: Diagnosis not present

## 2016-03-01 LAB — HM MAMMOGRAPHY

## 2016-03-02 ENCOUNTER — Other Ambulatory Visit: Payer: Self-pay

## 2016-03-02 ENCOUNTER — Encounter: Payer: Self-pay | Admitting: *Deleted

## 2016-03-02 ENCOUNTER — Ambulatory Visit (HOSPITAL_COMMUNITY): Payer: Medicare Other | Attending: Internal Medicine

## 2016-03-02 DIAGNOSIS — I499 Cardiac arrhythmia, unspecified: Secondary | ICD-10-CM

## 2016-03-02 DIAGNOSIS — I501 Left ventricular failure: Secondary | ICD-10-CM | POA: Diagnosis not present

## 2016-03-02 DIAGNOSIS — I700 Atherosclerosis of aorta: Secondary | ICD-10-CM | POA: Diagnosis not present

## 2016-03-02 DIAGNOSIS — I350 Nonrheumatic aortic (valve) stenosis: Secondary | ICD-10-CM | POA: Diagnosis not present

## 2016-03-02 DIAGNOSIS — R9439 Abnormal result of other cardiovascular function study: Secondary | ICD-10-CM | POA: Diagnosis not present

## 2016-03-02 DIAGNOSIS — I340 Nonrheumatic mitral (valve) insufficiency: Secondary | ICD-10-CM | POA: Diagnosis not present

## 2016-03-02 LAB — ECHOCARDIOGRAM COMPLETE
AOPV: 0.4 m/s
AV Area VTI index: 0.77 cm2/m2
AV Mean grad: 10 mmHg
AV Peak grad: 23 mmHg
AV VEL mean LVOT/AV: 0.45
AV peak Index: 0.69
AV pk vel: 238 cm/s
AV vel: 1.13
AVAREAMEANV: 1.15 cm2
AVAREAMEANVIN: 0.78 cm2/m2
AVAREAVTI: 1.01 cm2
CHL CUP AV VALUE AREA INDEX: 0.77
CHL CUP DOP CALC LVOT VTI: 19.2 cm
DOP CAL AO MEAN VELOCITY: 149 cm/s
E decel time: 268 msec
EERAT: 16.4
FS: 29 % (ref 28–44)
IV/PV OW: 0.83
LA diam end sys: 44 mm
LA vol A4C: 18 ml
LA vol: 26 mL
LADIAMINDEX: 2.99 cm/m2
LASIZE: 44 mm
LAVOLIN: 17.7 mL/m2
LDCA: 2.54 cm2
LV E/e' medial: 16.4
LV E/e'average: 16.4
LV TDI E'LATERAL: 5.64
LV e' LATERAL: 5.64 cm/s
LVOT SV: 49 mL
LVOT peak grad rest: 4 mmHg
LVOTD: 18 mm
LVOTPV: 94.9 cm/s
LVOTVTI: 0.44 cm
Lateral S' vel: 11.4 cm/s
MV Dec: 268
MV Peak grad: 3 mmHg
MVPKAVEL: 101 m/s
MVPKEVEL: 92.5 m/s
PW: 10.8 mm — AB (ref 0.6–1.1)
TDI e' medial: 5.64
VTI: 43.3 cm
Valve area: 1.13 cm2

## 2016-03-03 DIAGNOSIS — C7989 Secondary malignant neoplasm of other specified sites: Secondary | ICD-10-CM | POA: Diagnosis not present

## 2016-03-03 DIAGNOSIS — M542 Cervicalgia: Secondary | ICD-10-CM | POA: Diagnosis not present

## 2016-03-14 DIAGNOSIS — J189 Pneumonia, unspecified organism: Secondary | ICD-10-CM | POA: Diagnosis not present

## 2016-03-18 ENCOUNTER — Ambulatory Visit (INDEPENDENT_AMBULATORY_CARE_PROVIDER_SITE_OTHER)
Admission: RE | Admit: 2016-03-18 | Discharge: 2016-03-18 | Disposition: A | Discharge status: Home / Self Care | Payer: Medicare Other | Source: Ambulatory Visit | Attending: Family Medicine | Admitting: Family Medicine

## 2016-03-18 ENCOUNTER — Encounter: Payer: Self-pay | Admitting: Family Medicine

## 2016-03-18 ENCOUNTER — Ambulatory Visit (INDEPENDENT_AMBULATORY_CARE_PROVIDER_SITE_OTHER): Payer: Medicare Other | Admitting: Family Medicine

## 2016-03-18 VITALS — BP 118/64 | HR 73 | Temp 98.7°F | Wt 104.8 lb

## 2016-03-18 DIAGNOSIS — R634 Abnormal weight loss: Secondary | ICD-10-CM | POA: Diagnosis not present

## 2016-03-18 DIAGNOSIS — E118 Type 2 diabetes mellitus with unspecified complications: Secondary | ICD-10-CM

## 2016-03-18 DIAGNOSIS — R5383 Other fatigue: Secondary | ICD-10-CM | POA: Diagnosis not present

## 2016-03-18 DIAGNOSIS — I350 Nonrheumatic aortic (valve) stenosis: Secondary | ICD-10-CM

## 2016-03-18 DIAGNOSIS — R5381 Other malaise: Secondary | ICD-10-CM | POA: Diagnosis not present

## 2016-03-18 DIAGNOSIS — E1151 Type 2 diabetes mellitus with diabetic peripheral angiopathy without gangrene: Secondary | ICD-10-CM

## 2016-03-18 DIAGNOSIS — J449 Chronic obstructive pulmonary disease, unspecified: Secondary | ICD-10-CM | POA: Diagnosis not present

## 2016-03-18 DIAGNOSIS — F17200 Nicotine dependence, unspecified, uncomplicated: Secondary | ICD-10-CM

## 2016-03-18 DIAGNOSIS — I700 Atherosclerosis of aorta: Secondary | ICD-10-CM | POA: Insufficient documentation

## 2016-03-18 LAB — BASIC METABOLIC PANEL
BUN: 9 mg/dL (ref 6–23)
CHLORIDE: 97 meq/L (ref 96–112)
CO2: 30 mEq/L (ref 19–32)
Calcium: 9.6 mg/dL (ref 8.4–10.5)
Creatinine, Ser: 0.66 mg/dL (ref 0.40–1.20)
GFR: 91.31 mL/min (ref >60.00)
GLUCOSE: 163 mg/dL — AB (ref 70–99)
POTASSIUM: 3.8 meq/L (ref 3.5–5.1)
Sodium: 136 mEq/L (ref 135–145)

## 2016-03-18 LAB — CBC WITH DIFFERENTIAL/PLATELET
BASOS PCT: 0.7 % (ref 0.0–3.0)
Basophils Absolute: 0.1 10*3/uL (ref 0.0–0.1)
EOS PCT: 0.5 % (ref 0.0–5.0)
Eosinophils Absolute: 0.1 10*3/uL (ref 0.0–0.7)
HCT: 35.5 % — ABNORMAL LOW (ref 36.0–46.0)
Hemoglobin: 11.5 g/dL — ABNORMAL LOW (ref 12.0–15.0)
LYMPHS ABS: 1.9 10*3/uL (ref 0.7–4.0)
Lymphocytes Relative: 11.8 % — ABNORMAL LOW (ref 12.0–46.0)
MCHC: 32.4 g/dL (ref 30.0–36.0)
MCV: 85.6 fl (ref 78.0–100.0)
MONO ABS: 1.8 10*3/uL — AB (ref 0.1–1.0)
MONOS PCT: 11.6 % (ref 3.0–12.0)
NEUTROS PCT: 75.4 % (ref 43.0–77.0)
Neutro Abs: 11.9 10*3/uL — ABNORMAL HIGH (ref 1.4–7.7)
Platelets: 423 10*3/uL — ABNORMAL HIGH (ref 150.0–400.0)
RBC: 4.14 Mil/uL (ref 3.87–5.11)
RDW: 15.8 % — AB (ref 11.5–15.5)
WBC: 15.8 10*3/uL — ABNORMAL HIGH (ref 4.0–10.5)

## 2016-03-18 LAB — ALBUMIN: Albumin: 3.5 g/dL (ref 3.5–5.2)

## 2016-03-18 LAB — POC URINALSYSI DIPSTICK (AUTOMATED)
BILIRUBIN UA: NEGATIVE
GLUCOSE UA: NEGATIVE
KETONES UA: NEGATIVE
Leukocytes, UA: NEGATIVE
Nitrite, UA: NEGATIVE
RBC UA: NEGATIVE
SPEC GRAV UA: 1.015
Urobilinogen, UA: 1
pH, UA: 7

## 2016-03-18 LAB — SEDIMENTATION RATE: Sed Rate: 104 mm/hr — ABNORMAL HIGH (ref 0–30)

## 2016-03-18 LAB — HEMOGLOBIN A1C: HEMOGLOBIN A1C: 7.1 % — AB (ref 4.6–6.5)

## 2016-03-18 NOTE — Progress Notes (Signed)
Pre visit review using our clinic review tool, if applicable. No additional management support is needed unless otherwise documented below in the visit note. 

## 2016-03-18 NOTE — Progress Notes (Signed)
BP 118/64   Pulse 73   Temp 98.7 F (37.1 C) (Oral)   Wt 104 lb 12 oz (47.5 kg)   SpO2 96%   BMI 19.16 kg/m    CC: "I don't feel good" Subjective:    Patient ID: Desiree Floyd, female    DOB: 11-17-1932, 83 y.o.   MRN: [id number]  HPI: Desiree Floyd is a 83 y.o. female presenting on 03/18/2016 for Fatigue   See prior note for details. Seen here 02/11/2016 with 2 wk h/o night sweats and fatigue - labs showed marked hypokalemia that was repleted. EKG showed possible intermittent atrial tachycardias - event monitor by cardiology was reassuring.  Saw ENT recently Desiree Floyd) - declined f/u CT for metastatic squamous cell in head/neck LN of unknown primary s/p excision 2015.   Saw cardiology - echo ordered to assess SEM - moderate AS.   Persistent low energy, fatigue. Nocturia x3. Ongoing night sweats. Ongoing neck pain down R neck down to shoulder. No fevers/chills, chest pain, palpitations, or dyspnea. No headaches. No appetite. 3 lb weight loss.   Smoking - 3/4 ppd.   DM - checks sugars on average once a day - 113-240s  Relevant past medical, surgical, family and social history reviewed and updated as indicated. Interim medical history since our last visit reviewed. Allergies and medications reviewed and updated. Current Outpatient Prescriptions on File Prior to Visit  Medication Sig  . albuterol (PROVENTIL HFA;VENTOLIN HFA) 108 (90 Base) MCG/ACT inhaler Inhale 2 puffs into the lungs every 6 (six) hours as needed for wheezing or shortness of breath.  Marland Kitchen alendronate (FOSAMAX) 70 MG tablet Take 1 tablet (70 mg total) by mouth every 7 (seven) days. Take with a full glass of water on an empty stomach. (Patient taking differently: Take 70 mg by mouth every Friday. Take with a full glass of water on an empty stomach.)  . amLODipine (NORVASC) 5 MG tablet Take 5 mg by mouth daily.  Marland Kitchen aspirin 81 MG tablet Take 81 mg by mouth daily.    . Calcium Carb-Cholecalciferol (CALCIUM-VITAMIN D)  600-400 MG-UNIT TABS Take 1 tablet by mouth daily.  Marland Kitchen glucose blood test strip 1 each by Other route as directed. Use to check sugar once daily and as needed. Dx: E11.8 **ONE TOUCH ULTRA**  . ibuprofen (ADVIL,MOTRIN) 400 MG tablet Take 400 mg by mouth every 6 (six) hours as needed for moderate pain.  Marland Kitchen ipratropium-albuterol (DUONEB) 0.5-2.5 (3) MG/3ML SOLN Use every 4 hours for 48 hours, then use every 4 hours as needed for wheezing or shortness of breath  . metoprolol tartrate (LOPRESSOR) 25 MG tablet Take 1 tablet (25 mg total) by mouth 2 (two) times daily.  Marland Kitchen nystatin (MYCOSTATIN) 100000 UNIT/ML suspension Take 5 mLs (500,000 Units total) by mouth 3 (three) times daily.  . Omega-3 Fatty Acids (FISH OIL) 1000 MG CPDR Take 1 capsule by mouth daily.  . simvastatin (ZOCOR) 20 MG tablet Take 1 tablet by mouth at  bedtime  . SPIRIVA HANDIHALER 18 MCG inhalation capsule Inhale the contents of 1  capsule via HandiHaler  daily  . SYMBICORT 160-4.5 MCG/ACT inhaler Use 2 puffs daily (Patient taking differently: 2 puffs twice daily)  . vitamin B-12 (CYANOCOBALAMIN) 1000 MCG tablet Take 1,000 mcg by mouth daily.   No current facility-administered medications on file prior to visit.     Review of Systems Per HPI unless specifically indicated in ROS section     Objective:    BP 118/64  Pulse 73   Temp 98.7 F (37.1 C) (Oral)   Wt 104 lb 12 oz (47.5 kg)   SpO2 96%   BMI 19.16 kg/m   Wt Readings from Last 3 Encounters:  03/18/16 104 lb 12 oz (47.5 kg)  02/16/16 107 lb 6.4 oz (48.7 kg)  02/11/16 107 lb 8 oz (48.8 kg)    Physical Exam  Constitutional: She appears well-developed and well-nourished. No distress.  HENT:  Mouth/Throat: No oropharyngeal exudate.  Yellow patches on tongue  Cardiovascular: Normal rate, regular rhythm and intact distal pulses.   Murmur (3/6 SEM best at LUSB) heard. Pulmonary/Chest: Effort normal. No respiratory distress. She has decreased breath sounds. She has no  wheezes. She has no rhonchi. She has no rales.  Coarse breath sounds throughout  Abdominal: Soft. Bowel sounds are normal. She exhibits no distension and no mass. There is no hepatosplenomegaly. There is no tenderness. There is no rigidity, no rebound, no guarding and negative Murphy's sign.  Musculoskeletal: She exhibits no edema.  Skin: Skin is warm and dry. No rash noted.  Psychiatric: She has a normal mood and affect.  Nursing note and vitals reviewed.   Lab Results  Component Value Date   HGBA1C 6.7 (H) 11/10/2015      Assessment & Plan:   Problem List Items Addressed This Visit    Aortic stenosis    Moderate by recent echo.       COPD (chronic obstructive pulmonary disease) (HCC)    Compliant with spiriva and symbicort.  Normal ambulatory pulse ox - maintains sats 95-97%      Relevant Orders   DG Chest 2 View   Diabetes mellitus type 2, controlled, with complications ([place])    Update A1c      Relevant Orders   Hemoglobin A1c   Diabetic peripheral vascular disease (HCC)   Loss of weight    Discussed starting glucerna supplement daily to stimulate appetite.  With ongoing neck pain and h/o squamous cell cancer found in LN of neck, will recommend pt return to ENT to get neck CT. Pt agrees.  Update CXR.  Check labs - CBC, albumin, ESR.  Check urinalysis.       Relevant Orders   Basic metabolic panel   CBC with Differential/Platelet   Albumin   Sedimentation rate   DG Chest 2 View   Malaise and fatigue - Primary    Ongoing trouble. ?metoprolol related - consider changing beta blocker if ongoing trouble Complete workup as below and f/u pending results. RTC 1 mo f/u visit.      Relevant Orders   Basic metabolic panel   CBC with Differential/Platelet   Sedimentation rate   DG Chest 2 View   Smoker    Restarted smoking 3/4 PPD. Continue to encourage cessation.       Relevant Orders   DG Chest 2 View       Follow up plan: Return in about 4 weeks (around  04/15/2016) for follow up visit.  [name], MD

## 2016-03-18 NOTE — Addendum Note (Signed)
Addended by: Royann Shivers A on: 03/18/2016 11:03 AM   Modules accepted: Orders

## 2016-03-18 NOTE — Assessment & Plan Note (Signed)
Moderate by recent echo.

## 2016-03-18 NOTE — Assessment & Plan Note (Signed)
Discussed starting glucerna supplement daily to stimulate appetite.  With ongoing neck pain and h/o squamous cell cancer found in LN of neck, will recommend pt return to ENT to get neck CT. Pt agrees.  Update CXR.  Check labs - CBC, albumin, ESR.  Check urinalysis.

## 2016-03-18 NOTE — Assessment & Plan Note (Signed)
Compliant with spiriva and symbicort.  Normal ambulatory pulse ox - maintains sats 95-97%

## 2016-03-18 NOTE — Assessment & Plan Note (Signed)
Update A1c ?

## 2016-03-18 NOTE — Assessment & Plan Note (Signed)
Restarted smoking 3/4 PPD. Continue to encourage cessation.

## 2016-03-18 NOTE — Patient Instructions (Addendum)
Labs today Urinalysis today for fatigue. Call Dr [name]' office to schedule CT scan of neck for ongoing pain.  Start glucerna 1/2-1 supplement daily 30 minutes prior to lunch.  Ambulatory pulse ox today.  We will be in touch with results.  Return in 1 month for follow up visit.

## 2016-03-18 NOTE — Assessment & Plan Note (Signed)
Ongoing trouble. ?metoprolol related - consider changing beta blocker if ongoing trouble Complete workup as below and f/u pending results. RTC 1 mo f/u visit.

## 2016-03-21 ENCOUNTER — Other Ambulatory Visit: Payer: Self-pay | Admitting: Family Medicine

## 2016-03-21 ENCOUNTER — Encounter: Payer: Self-pay | Admitting: Family Medicine

## 2016-03-21 DIAGNOSIS — D72829 Elevated white blood cell count, unspecified: Secondary | ICD-10-CM

## 2016-03-22 ENCOUNTER — Telehealth: Payer: Self-pay | Admitting: Family Medicine

## 2016-03-22 NOTE — Telephone Encounter (Signed)
Patient returned Kim's call. °

## 2016-03-22 NOTE — Telephone Encounter (Signed)
Spoke with patient.

## 2016-03-23 ENCOUNTER — Other Ambulatory Visit (INDEPENDENT_AMBULATORY_CARE_PROVIDER_SITE_OTHER): Payer: Medicare Other

## 2016-03-23 ENCOUNTER — Other Ambulatory Visit: Payer: Self-pay | Admitting: Otolaryngology

## 2016-03-23 DIAGNOSIS — D72829 Elevated white blood cell count, unspecified: Secondary | ICD-10-CM | POA: Diagnosis not present

## 2016-03-23 DIAGNOSIS — M542 Cervicalgia: Secondary | ICD-10-CM

## 2016-03-23 DIAGNOSIS — IMO0002 Reserved for concepts with insufficient information to code with codable children: Secondary | ICD-10-CM

## 2016-03-24 ENCOUNTER — Ambulatory Visit
Admission: RE | Admit: 2016-03-24 | Discharge: 2016-03-24 | Disposition: A | Discharge status: Home / Self Care | Payer: Medicare Other | Source: Ambulatory Visit | Attending: Otolaryngology | Admitting: Otolaryngology

## 2016-03-24 DIAGNOSIS — M542 Cervicalgia: Secondary | ICD-10-CM | POA: Diagnosis not present

## 2016-03-24 DIAGNOSIS — IMO0002 Reserved for concepts with insufficient information to code with codable children: Secondary | ICD-10-CM

## 2016-03-24 LAB — PATHOLOGIST SMEAR REVIEW

## 2016-03-24 MED ORDER — IOPAMIDOL (ISOVUE-300) INJECTION 61%
75.0000 mL | Freq: Once | INTRAVENOUS | Status: AC | PRN
  Administered 2016-03-24: 75 mL via INTRAVENOUS

## 2016-03-25 ENCOUNTER — Other Ambulatory Visit: Payer: Self-pay | Admitting: Family Medicine

## 2016-03-25 ENCOUNTER — Telehealth: Payer: Self-pay | Admitting: Internal Medicine

## 2016-03-25 LAB — PROTEIN ELECTROPHORESIS, SERUM, WITH REFLEX
ALBUMIN ELP: 2.5 g/dL — AB (ref 3.8–4.8)
ALPHA-1-GLOBULIN: 0.6 g/dL — AB (ref 0.2–0.3)
ALPHA-2-GLOBULIN: 1.2 g/dL — AB (ref 0.5–0.9)
Beta 2: 0.6 g/dL — ABNORMAL HIGH (ref 0.2–0.5)
Beta Globulin: 0.5 g/dL (ref 0.4–0.6)
Gamma Globulin: 1.1 g/dL (ref 0.8–1.7)
Total Protein, Serum Electrophoresis: 6.5 g/dL (ref 6.1–8.1)

## 2016-03-25 NOTE — Telephone Encounter (Signed)
ATC Glenda with Dr. [name] office, office is currently closed.   wcb tomorrow morning

## 2016-03-26 LAB — IFE INTERPRETATION

## 2016-03-27 LAB — CULTURE, BLOOD (SINGLE)

## 2016-03-30 NOTE — Telephone Encounter (Signed)
Attempted to call Dr. [name] office again, not open yet.  Pt has the consult tomorrow, so we will hold off on ordering the CT until this visit.

## 2016-03-31 ENCOUNTER — Telehealth: Payer: Self-pay | Admitting: Internal Medicine

## 2016-03-31 ENCOUNTER — Ambulatory Visit (INDEPENDENT_AMBULATORY_CARE_PROVIDER_SITE_OTHER): Payer: Medicare Other | Admitting: Internal Medicine

## 2016-03-31 ENCOUNTER — Encounter: Payer: Self-pay | Admitting: Internal Medicine

## 2016-03-31 VITALS — BP 120/66 | HR 80 | Ht 62.0 in | Wt 102.0 lb

## 2016-03-31 DIAGNOSIS — R918 Other nonspecific abnormal finding of lung field: Secondary | ICD-10-CM

## 2016-03-31 DIAGNOSIS — Z859 Personal history of malignant neoplasm, unspecified: Secondary | ICD-10-CM

## 2016-03-31 DIAGNOSIS — B370 Candidal stomatitis: Secondary | ICD-10-CM

## 2016-03-31 DIAGNOSIS — F17210 Nicotine dependence, cigarettes, uncomplicated: Secondary | ICD-10-CM | POA: Diagnosis not present

## 2016-03-31 DIAGNOSIS — Z9889 Other specified postprocedural states: Secondary | ICD-10-CM

## 2016-03-31 DIAGNOSIS — J449 Chronic obstructive pulmonary disease, unspecified: Secondary | ICD-10-CM | POA: Diagnosis not present

## 2016-03-31 MED ORDER — TIOTROPIUM BROMIDE MONOHYDRATE 2.5 MCG/ACT IN AERS: INHALATION_SPRAY | RESPIRATORY_TRACT | 11 refills | Status: DC

## 2016-03-31 MED ORDER — TIOTROPIUM BROMIDE MONOHYDRATE 2.5 MCG/ACT IN AERS: 2.0000 | INHALATION_SPRAY | Freq: Every day | RESPIRATORY_TRACT | 11 refills | Status: AC

## 2016-03-31 MED ORDER — CLOTRIMAZOLE 10 MG MT TROC: 10.0000 mg | Freq: Every day | OROMUCOSAL | 2 refills | Status: AC

## 2016-03-31 MED ORDER — TIOTROPIUM BROMIDE MONOHYDRATE 2.5 MCG/ACT IN AERS: 2.0000 | INHALATION_SPRAY | Freq: Every day | RESPIRATORY_TRACT | 0 refills | Status: DC

## 2016-03-31 NOTE — Telephone Encounter (Signed)
Called [facility] and spoke with a pharmacy tech. Pt's Spiriva instructions have been clarified. Nothing further was needed.

## 2016-03-31 NOTE — Patient Instructions (Addendum)
Plan A = Automatic = Spiriva 2 pffs each am and stop symbicort   Work on inhaler technique:  relax and gently blow all the way out then take a nice smooth deep breath back in, triggering the inhaler at same time you start breathing in.  Hold for up to 5 seconds if you can. Blow out thru nose. Rinse and gargle with water when done      Plan B = Backup Only use your albuterol as a rescue medication to be used if you can't catch your breath by resting or doing a relaxed purse lip breathing pattern.  - The less you use it, the better it will work when you need it. - Ok to use the inhaler up to 2 puffs  every 4 hours if you must but call for appointment if use goes up over your usual need - Don't leave home without it !!  (think of it like the spare tire for your car)   Plan C = Crisis - only use your albuterol nebulizer if you first try Plan B and it fails to help > ok to use the nebulizer up to every 4 hours but if start needing it regularly call for immediate appointment   Clotrimazole troch 4 x daily as needed   Please see patient coordinator before you leave today  to schedule PET Scan

## 2016-03-31 NOTE — Progress Notes (Signed)
Subjective:     Patient ID: Desiree Floyd, female   DOB: [birth year],    MRN: [id number]  HPI  45 yowf active smoker s/p L neck surgery =  left suprahyoid neck dissection. , thought from primary squamous cell cancer of lip 09/27/13 [name]) s rt no chemo by [name] then admitted with Pna   Admit date: 11/10/2015 Discharge date: 11/12/2015    Brief/Interim Summary: 83 year old female with a history of COPD, CAD, squamous cell cancer left, hypertension, hyperlipidemia presented with one-week history of increasing shortness breath, cough, malaise and subjective chills. Patient has also had decreased oral intake for the past week. Unfortunately, she continues to smoke one half pack per day. She has had a nearly 60-pack-year history. In addition, the patient states that she has only been using her Symbicort once daily and not using her Spiriva as directed. Upon presentation, the patient was noted to be afebrile and hemodynamically stable with saturations of 96-97 percent on room air. Chest x-ray revealed possible right upper lobe opacity. The patient was admitted for intravenous antibiotics and treatment of possible pneumonia.   Hospital course:  COPD exacerbation Patient was given steroids and bronchodilators. Viral panel positive for parainfluenza virus. Patient's wheezing improved with continued treatments. Sent home with a nebulizer to continue nebulizer treatment and prednisone therapy  Pulmonary opacity Likely secondary to viral pneumonia. Supportive care given. Initial antibiotics (azithromycin and ceftriaxone) were held after first dose  Dehydration Hyponatremia IV fluids given, and sodium improved with improvement in hydration status  Diabetes mellitus, type 2 Patient on sliding scale while inpatient  Tobacco abuse Cessation discussed  Coronary artery disease with stent placement Continued aspirin and statin. Continued metoprolol  Hypertension Continued metoprolol.  Amlodipine, hydrochlorothiazide, triamterene held secondary to low blood pressures.  Discharge Diagnoses:  Active Problems:   HLD (hyperlipidemia)   Gout   Essential hypertension   COPD (chronic obstructive pulmonary disease) (HCC)   Diabetes mellitus type 2, controlled, with complications ([place])   CAD (coronary artery disease)   Diabetic peripheral vascular disease ([place])   CAP (community acquired pneumonia)   Community acquired pneumonia of right upper lobe of lung ([place])    03/31/2016 1st [facility] office visit/ Marius Betts   Chief Complaint  Patient presents with  . Pulmonary Consult    Referred by Dr [name] for eval of lung mass.  Pt c/o cough and SOB for the past several months. She has had a loss of appetite also.  Her cough is prod at times with clear sputum.    After admit for pna felt better except felt weak p cap in 10/2015 Early Jan 2018 pain R jaw/neck > [facility]> ct neck c/w lung ca  Doe = MMRC2 = can't walk a nl pace on a flat grade s sob but does fine slow and flat eg HT walking/ has thrush on symb  Has no dysphagia but def anorexia/ min cough s excess/ purulent sputum or mucus plugs    No obvious day to day or daytime variability or assoc limiting sob or cp or chest tightness, subjective wheeze or overt sinus or hb symptoms. No unusual exp hx or h/o childhood pna/ asthma or knowledge of premature birth.  Sleeping ok without nocturnal  or early am exacerbation  of respiratory  c/o's or need for noct saba. Also denies any obvious fluctuation of symptoms with weather or environmental changes or other aggravating or alleviating factors except as outlined above   Current Medications, Allergies, Complete Past Medical History, Past  Surgical History, Family History, and Social History were reviewed in [employer] record.  ROS  The following are not active complaints unless bolded sore throat, dysphagia, dental problems, itching, sneezing,  nasal congestion  or excess/ purulent secretions, ear ache,   fever, chills, sweats, unintended wt loss, classically pleuritic or exertional cp,  orthopnea pnd or leg swelling, presyncope, palpitations, abdominal pain, anorexia, nausea, vomiting, diarrhea  or change in bowel or bladder habits, change in stools or urine, dysuria,hematuria,  rash, arthralgias, visual complaints, headache, numbness, weakness or ataxia or problems with walking or coordination,  change in mood/affect or memory.             Review of Systems     Objective:   Physical Exam    Wt Readings from Last 3 Encounters:  03/31/16 102 lb (46.3 kg)  03/18/16 104 lb 12 oz (47.5 kg)  02/16/16 107 lb 6.4 oz (48.7 kg)    Vital signs reviewed  - Note on arrival 02 sats   98% on RA   HEENT: nl dentition, turbinates bilaterally, and oropharynx. Nl external ear canals without cough reflex   NECK :  without JVD/Nodes/TM/ nl carotid upstrokes bilaterally   LUNGS: no acc muscle use,  Nl contour chest which is clear to A and P bilaterally without cough on insp or exp maneuvers   CV:  RRR  no s3 or murmur or increase in P2, and no edema   ABD:  soft and nontender with nl inspiratory excursion in the supine position. No bruits or organomegaly appreciated, bowel sounds nl  MS:  Nl gait/ ext warm without deformities, calf tenderness, cyanosis or clubbing No obvious joint restrictions   SKIN: warm and dry without lesions    NEURO:  alert, approp, nl sensorium with  no motor or cerebellar deficits apparent.       I personally reviewed images and agree with radiology impression as follows:  CT w contrast Neck 03/24/16 1. No evidence of acute abnormality or recurrent malignancy in the neck. 2. Partially visualized cavitary right hilar lung mass and necrotic mediastinal lymphadenopathy concerning for primary lung cancer.    Assessment:

## 2016-04-01 ENCOUNTER — Institutional Professional Consult (permissible substitution): Payer: Medicare Other | Admitting: Internal Medicine

## 2016-04-01 NOTE — Assessment & Plan Note (Signed)
L neck with LAD s/p excision, thought from primary squamous cell cancer of lip 09/2013 [name]) - CT neck 03/24/16 1. No evidence of acute abnormality or recurrent malignancy in the neck. 2. Partially visualized cavitary right hilar lung mass and necrotic mediastinal lymphadenopathy concerning for primary lung cancer.  PET scan ordered  03/31/2016

## 2016-04-01 NOTE — Assessment & Plan Note (Signed)
See CT neck 03/24/16 c/w subcarinal necrotic node   Active smoking with h/o sq cell ca head and neck with neg neck findings but ct neck certainly worrisome for lung primary. First step is PET scan to see best option for tissue dx  Discussed in detail all the  indications, usual  risks and alternatives  relative to the benefits with patient who agrees to proceed with w/u as outlined  ? FOB p PET vs other bx options  Total time devoted to counseling  > 50 % of initial 60 min office visit:  review case with pt/ discussion of options/alternatives/ personally creating written customized instructions  in presence of pt  then going over those specific  Instructions directly with the pt including how to use all of the meds but in particular covering each new medication in detail and the difference between the maintenance= "automatic" meds and the prns using an action plan format for the latter (If this problem/symptom => do that organization reading Left to right).  Please see AVS from this visit for a full list of these instructions which I personally wrote for this pt and  are unique to this visit.

## 2016-04-01 NOTE — Assessment & Plan Note (Signed)
Did not resp to [facility] > try clotrimzole and off ics

## 2016-04-01 NOTE — Assessment & Plan Note (Signed)
03/31/2016  After extensive coaching  [facility] effectiveness =    75% try off symb/ on spiriva respimat 2 each am

## 2016-04-01 NOTE — Assessment & Plan Note (Signed)

## 2016-04-02 ENCOUNTER — Ambulatory Visit (INDEPENDENT_AMBULATORY_CARE_PROVIDER_SITE_OTHER): Payer: Medicare Other | Admitting: Cardiology

## 2016-04-02 ENCOUNTER — Encounter: Payer: Self-pay | Admitting: Cardiology

## 2016-04-02 VITALS — BP 118/54 | HR 91 | Ht 62.0 in | Wt 102.0 lb

## 2016-04-02 DIAGNOSIS — E785 Hyperlipidemia, unspecified: Secondary | ICD-10-CM

## 2016-04-02 DIAGNOSIS — F17200 Nicotine dependence, unspecified, uncomplicated: Secondary | ICD-10-CM | POA: Diagnosis not present

## 2016-04-02 DIAGNOSIS — I491 Atrial premature depolarization: Secondary | ICD-10-CM

## 2016-04-02 DIAGNOSIS — I2510 Atherosclerotic heart disease of native coronary artery without angina pectoris: Secondary | ICD-10-CM

## 2016-04-02 DIAGNOSIS — I10 Essential (primary) hypertension: Secondary | ICD-10-CM | POA: Diagnosis not present

## 2016-04-02 NOTE — Patient Instructions (Signed)
Continue your current therapy  We will await the results of your PET scan.  I will see you in 6 months.

## 2016-04-02 NOTE — Progress Notes (Signed)
Desiree Floyd Date of Birth: Dec 12, 1932   History of Present Illness: Desiree Floyd is seen today for followup CAD.  She has a history of coronary disease with a remote lateral myocardial infarction in 2004 treated with stenting of the left circumflex coronary with a drug-eluting stent. Myoview study in May 2013 was normal. She also has a history of dyslipidemia, diabetes mellitus, hypertension, and tobacco use. She continues to smoke some.  She does have evidence of peripheral arterial disease with ABIs of 0.96 on the left and 0.81 on the right in the past. I cannot find when this study was done. She has mild claudication symptoms walking uphill.   She was seen recently for evaluation of irregular pulse. Noted to have PACs. Event monitor placed to rule out PAfib. This also showed PACs with no Afib. Also noted a murmur and Echo ordered. Echo showed normal LV function with grade 1 diastolic dysfunction. There was moderate aortic stenosis. She was also recently evaluated for neck pain. CT showed evidence of a right hilar lung mass with necrotic mediastinal lymphadenopathy c/w malignancy. She has seen Dr. Melvyn Novas and PET scan ordered.  She reports that she was feeling quite fatigued. She developed oral thrush. Appetite was poor and she lost weight. She states Dr. Melvyn Novas changed her inhaler therapy and gave her Mycelex for her thrush. Since then she has been feeling better.   Current Outpatient Prescriptions on File Prior to Visit  Medication Sig Dispense Refill  . albuterol (PROVENTIL HFA;VENTOLIN HFA) 108 (90 Base) MCG/ACT inhaler Inhale 2 puffs into the lungs every 6 (six) hours as needed for wheezing or shortness of breath. 1 Inhaler 1  . alendronate (FOSAMAX) 70 MG tablet TAKE 1 TABLET BY MOUTH  EVERY 7 DAYS WITH A FULL  GLASS OF WATER ON AN EMPTY  STOMACH 12 tablet 1  . amLODipine (NORVASC) 5 MG tablet Take 5 mg by mouth daily.    Marland Kitchen aspirin 81 MG tablet Take 81 mg by mouth daily.      . Calcium  Carb-Cholecalciferol (CALCIUM-VITAMIN D) 600-400 MG-UNIT TABS Take 1 tablet by mouth daily.    . cholecalciferol (VITAMIN D) 1000 units tablet Take 1,000 Units by mouth daily.    . clotrimazole (MYCELEX) 10 MG troche Take 1 tablet (10 mg total) by mouth 5 (five) times daily. 16 tablet 2  . glucose blood test strip 1 each by Other route as directed. Use to check sugar once daily and as needed. Dx: E11.8 **ONE TOUCH ULTRA** 100 each 3  . ipratropium-albuterol (DUONEB) 0.5-2.5 (3) MG/3ML SOLN USE 1 VIAL VIA NEBULIZER  EVERY 4 HOURS FOR 48 HOURS, THEN USE EVERY 4 HOURS AS  NEEDED FOR WHEEZING OR  SHORTNESS OF BREATH 360 mL 1  . metoprolol tartrate (LOPRESSOR) 25 MG tablet Take 1 tablet (25 mg total) by mouth 2 (two) times daily. 180 tablet 1  . Omega-3 Fatty Acids (FISH OIL) 1000 MG CPDR Take 1 capsule by mouth daily.    . simvastatin (ZOCOR) 20 MG tablet Take 1 tablet by mouth at  bedtime 90 tablet 1  . Tiotropium Bromide Monohydrate (SPIRIVA RESPIMAT) 2.5 MCG/ACT AERS Inhale 2 puffs into the lungs daily. 1 Inhaler 11  . vitamin B-12 (CYANOCOBALAMIN) 1000 MCG tablet Take 1,000 mcg by mouth daily.     No current facility-administered medications on file prior to visit.     Allergies  Allergen Reactions  . Augmentin [Amoxicillin-Pot Clavulanate] Other (See Comments)    Diarrhea and vomiting  .  Chantix [Varenicline] Other (See Comments)    Bad dreams  . Lisinopril Cough    Past Medical History:  Diagnosis Date  . Allergy   . Aortic atherosclerosis ([place])    by CXR  . Asthma   . Benign breast lumps    knows breast lumps, told scar tissue from implants  . BREAST MASS, BENIGN 03/12/2010  . CAD (coronary artery disease) 2004   s/p stent CFX [[place]]  . CAP (community acquired pneumonia) 11/10/2015  . COPD (chronic obstructive pulmonary disease) ([place]) 2010   FVC 49%, FEV1 39%, ratio 0.59, not reversible w SABA (05/2008)  . CPPD (pseudo-gout)   . Diet-controlled diabetes mellitus ([place]) since  2012   diet controlled  . DJD (degenerative joint disease) of lumbar spine   . Exertional shortness of breath   . Gout    [place]  . Hyperlipidemia   . Hypertension   . Mass of left submandibular region 2015   s/p aspiration then excision by ENT - squamous cell ? spread from lip SqCCa rec pt CT 05/2015  . Myocardial infarction 2004   DES CFX  . OA (osteoarthritis)    of hands  . On home oxygen therapy    "4L; just at night" (11/10/2015)  . Osteoporosis 2016   T -2.7 spine, -2.6 hip  . PAC (premature atrial contraction)    chronic  . Parainfluenza virus pneumonia 11/14/2015  . Smoker    1/2 ppd  . Squamous cell cancer of lip 2014   L lower lip s/p excision  . Urge incontinence 2008   s/p urodynamics [[place]]    Past Surgical History:  Procedure Laterality Date  . AUGMENTATION MAMMAPLASTY Bilateral 1970's  . BASAL CELL CARCINOMA EXCISION Left 2014   "lower lip"  . CATARACT EXTRACTION W/ INTRAOCULAR LENS  IMPLANT, BILATERAL Bilateral 2010  . COLONOSCOPY  09/2005   tortuous colon, hem o/w WNL ([place])  . CORONARY ANGIOPLASTY WITH STENT PLACEMENT  2004   "1"   . MIDDLE EAR SURGERY Bilateral    "had one surgery on each ear for hearing loss:  prosthesis implanted in right ear; metal piece placed in left ear"  . RADICAL NECK DISSECTION Left 09/27/2013   Procedure: LEFT SUPRAHYOID DISSECTION;  Surgeon: Melissa Montane, MD - LN with metastatic SCC unknown primary  . TONSILLECTOMY  1950's    History  Smoking Status  . Current Every Day Smoker  . Packs/day: 0.75  . Years: 60.00  . Types: Cigarettes  Smokeless Tobacco  . Never Used    History  Alcohol Use  . Yes    Comment: 11/10/2015  "maybe a glass of wine twice/yr"    Family History  Problem Relation Age of Onset  . Heart disease Father 20    CAD AND HEART ATTACK  . Other Mother     old age, age 88  . Heart disease Mother     Review of Systems: As noted in history of present illness. All other systems  were reviewed and are negative.  Physical Exam: BP (!) 118/54   Pulse 91   Ht 5\' 2"  (1.575 m)   Wt 102 lb (46.3 kg)   BMI 18.66 kg/m  She is an elderly white female in no acute distress.The patient is alert and oriented x 3.    The skin is warm and dry.  Color is normal.  The HEENT exam is normal.  The carotids are 2+ without bruits.  There is no thyromegaly.  There is  no JVD.  Old surgical scar in neck.  The lungs reveal scattered bilateral wheezes.  The heart exam reveals an irregular rate with a normal S1 and S2.  There is a gr 2/6 SEM RUSB. Normal S1-2.   The PMI is not displaced.   Abdominal exam reveals good bowel sounds.  There is no guarding or rebound.  There is no hepatosplenomegaly or tenderness.  There are no masses.  Exam of the legs reveal no clubbing, cyanosis, or edema.  The distal pulses are palpable. Cranial nerves II - XII are intact.  Motor and sensory functions are intact.  The gait is normal.  LABORATORY DATA: Lab Results  Component Value Date   WBC 15.8 (H) 03/18/2016   HGB 11.5 (L) 03/18/2016   HCT 35.5 (L) 03/18/2016   PLT 423.0 (H) 03/18/2016   GLUCOSE 163 (H) 03/18/2016   CHOL 126 04/14/2015   TRIG 105.0 04/14/2015   HDL 53.80 04/14/2015   LDLDIRECT 54.6 12/13/2013   LDLCALC 52 04/14/2015   ALT 12 02/11/2016   AST 15 02/11/2016   NA 136 03/18/2016   K 3.8 03/18/2016   CL 97 03/18/2016   CREATININE 0.66 03/18/2016   BUN 9 03/18/2016   CO2 30 03/18/2016   TSH 1.05 02/11/2016   HGBA1C 7.1 (H) 03/18/2016   MICROALBUR <0.7 04/14/2015    Echo 03/02/16: Study Conclusions  - Left ventricle: The cavity size was normal. Wall thickness was   increased in a pattern of mild LVH. Systolic function was normal.   The estimated ejection fraction was in the range of 60% to 65%.   Wall motion was normal; there were no regional wall motion   abnormalities. Doppler parameters are consistent with abnormal   left ventricular relaxation (grade 1 diastolic dysfunction).  The   E;e&' ratio is >15, suggesting elevated LV filling pressure. - Aortic valve: Mildly calcified leaflets. Moderate aortic valve   stenosis. Mean gradient (S): 10 mm Hg. Peak gradient (S): 23 mm   Hg. Valve area (VTI): 1.26 cm^2. Valve area (Vmean): 1.28 cm^2. - Mitral valve: Mildly thickened leaflets . There was trivial   regurgitation. - Left atrium: The atrium was normal in size. - Inferior vena cava: The vessel was normal in size. The   respirophasic diameter changes were in the normal range (>= 50%),   consistent with normal central venous pressure.  Impressions:  - LVEF 60-65%, mild LVH, normal wall motion, diastolic dysfunction,   elevated LV filling pressure, moderate calcific aortic stenosis-   AVA 1.3 cm2, trivial MR, normal LA size, normal IVC.  Event monitor 02/27/16: Study Highlights    Normal sinus rhythm  Rare brief runs of PACs  Rare PVCs  No atrial fibrillation seen   CLINICAL DATA:  Weight loss.  Chronic obstructive pulmonary disease  EXAM: CHEST  2 VIEW  COMPARISON:  November 11, 2015  FINDINGS: Lungs are hyperexpanded, stable. There is mild scarring in the right upper lobe which appears stable. There is no demonstrable edema or consolidation. Heart size is normal. Somewhat diminished vascularity in the upper lobes is stable and likely is due to underlying bullous disease. No evident adenopathy. There is atherosclerotic calcification in the aorta. No bone lesions evident.  IMPRESSION: Scarring right upper lobe. Lungs hyperexpanded. No edema or consolidation. Stable cardiac silhouette. There is aortic atherosclerosis.   Electronically Signed   By: Lowella Grip III M.D.   On: 03/18/2016 11:45  Study Result   CLINICAL DATA:  Right-sided neck pain extending  from the ear to the shoulder. History of squamous cell carcinoma with left radical neck dissection in 2015.  EXAM: CT NECK WITH CONTRAST  TECHNIQUE: Multidetector CT  imaging of the neck was performed using the standard protocol following the bolus administration of intravenous contrast.  CONTRAST:  72mL ISOVUE-300 IOPAMIDOL (ISOVUE-300) INJECTION 61%  COMPARISON:  07/20/2013  FINDINGS: Pharynx and larynx: No mass or parapharyngeal inflammatory change.  Salivary glands: Interval left-sided neck dissection including resection of the left submandibular gland. The right submandibular gland and both parotid glands are unremarkable.  Thyroid: Stable to minimally increased size of low-density left thyroid nodule, measuring 13 x 8 mm (previously 12 x 6 mm). Unchanged coarsely calcified 8 mm right thyroid nodule.  Lymph nodes: No enlarged lymph nodes are identified in the neck.  Vascular: Major vascular structures of the neck appear patent. Moderate calcified plaque at both carotid bifurcations without evidence of flow limiting stenosis.  Limited intracranial: Unremarkable.  Visualized orbits: Prior bilateral cataract extraction.  Mastoids and visualized paranasal sinuses: Small volume secretions in the left sphenoid sinus. Clear mastoid air cells.  Skeleton: Mild disc and moderate facet degeneration in the cervical spine. Minimal anterolisthesis of C4 on C5. No suspicious osseous lesion.  Upper chest: Moderate centrilobular emphysema. Biapical pleural-parenchymal scarring. Partially visualized cavitary right hilar mass with 2.9 x 2.9 cm necrotic precarinal lymph node.  Other: Partially visualized bilateral breast implants.  IMPRESSION: 1. No evidence of acute abnormality or recurrent malignancy in the neck. 2. Partially visualized cavitary right hilar lung mass and necrotic mediastinal lymphadenopathy concerning for primary lung cancer. Chest CT is recommended for further evaluation. These results will be called to the ordering clinician or representative by the Radiologist Assistant, and communication documented in the  PACS or zVision Dashboard.   Electronically Signed   By: Logan Bores M.D.   On: 03/24/2016 12:17     Assessment / Plan: 1. Coronary disease with remote stenting of the left circumflex coronary in 2004. She remains asymptomatic. Myoview study 2013 was normal. We will continue therapy with aspirin, metoprolol, and amlodipine.  2. Tobacco dependence. Stressed the importance of smoking cessation.  3. Peripheral arterial disease. Stable claudication symptoms without rest pain or ulcers.  Encouraged a regular walking program.  4. Diabetes mellitus type 2.  5. Hyperlipidemia well controlled on simvastatin.  6. Hypertension-controlled.  7. History of CA head and neck s/p radical neck dissection.   8. Moderate aortic stenosis- asymptomatic. Recommend yearly Echo to follow.   9. PACs with short runs or PACs. No Afib. Continue metoprolol. PACs are very common with her COPD.  10. Right hilar mass with mediastinal lymphadenopathy. Concerning for CA. Await PET scan results. May need biopsy for tissue diagnosis.  I will follow up in 6 months.

## 2016-04-06 IMAGING — CT CT NECK W/ CM
3 of 4 series · 15 of 33 positions shown, 18 images · IV contrast (omnipaque)
Comparison: None.

CLINICAL DATA: Palpable mass.  Left anterior neck.

EXAM:
CT NECK WITH CONTRAST
TECHNIQUE: Multidetector CT imaging of the neck was performed using the
standard protocol following the bolus administration of intravenous
contrast.
CONTRAST:  75mL OMNIPAQUE IOHEXOL 300 MG/ML  SOLN

[Series 3: neck_routine 3.0 b40s st · axial · 0.32mm/px · z∈[-234,-36]mm · 7 of 86 slices shown, 9 images]
[im 10/86  soft-tissue]
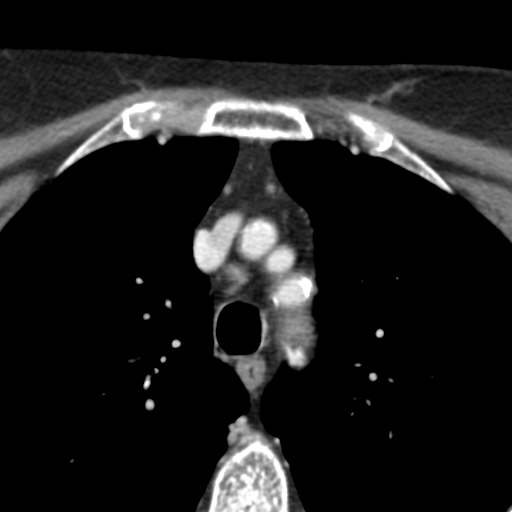
[im 10/86  bone]
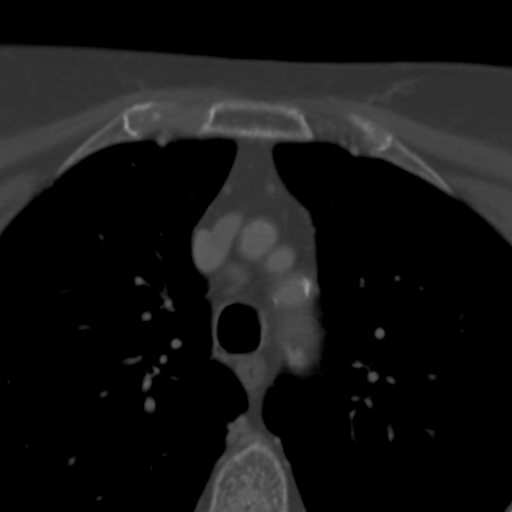
[im 19/86  bone]
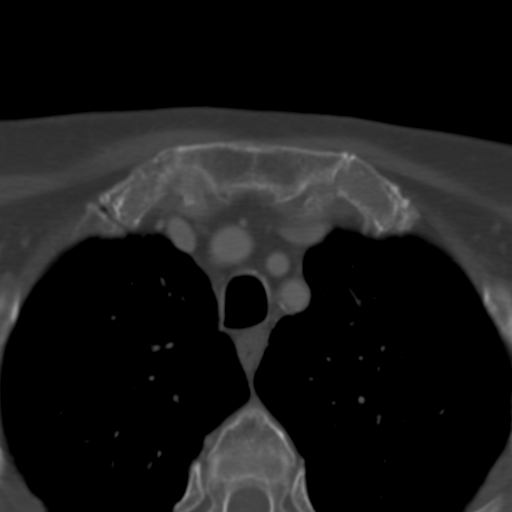
[im 29/86  bone]
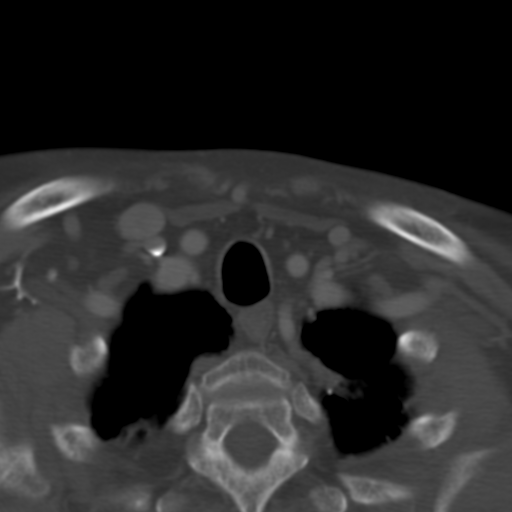
[im 48/86  bone]
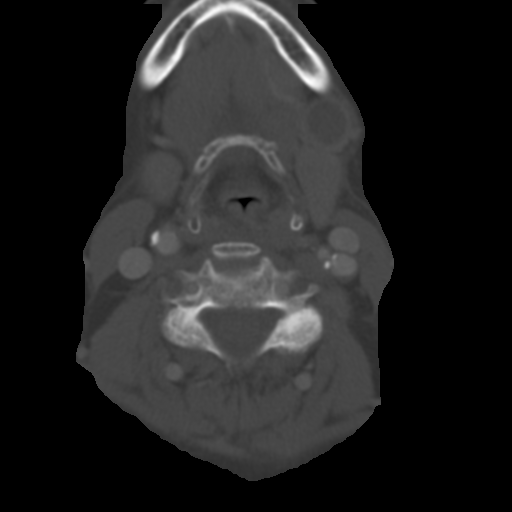
[im 57/86  soft-tissue]
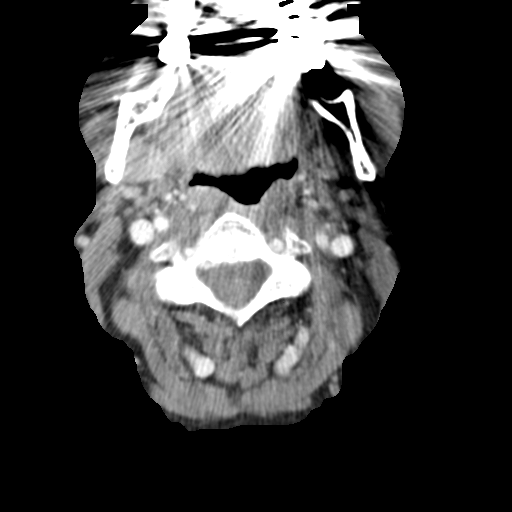
[im 57/86  bone]
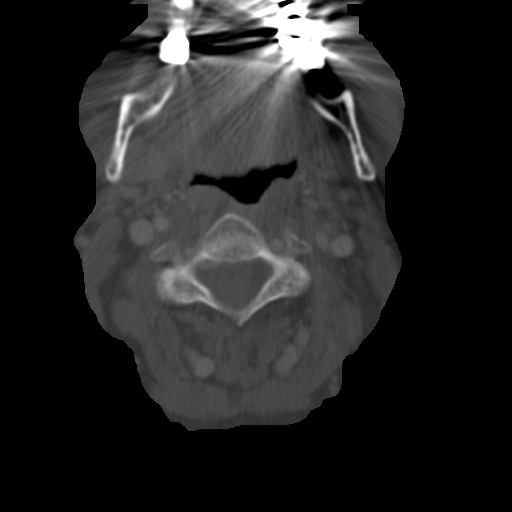
[im 67/86  bone]
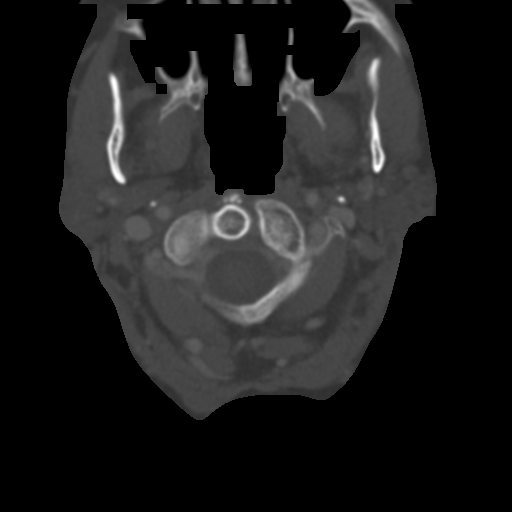
[im 76/86  bone]
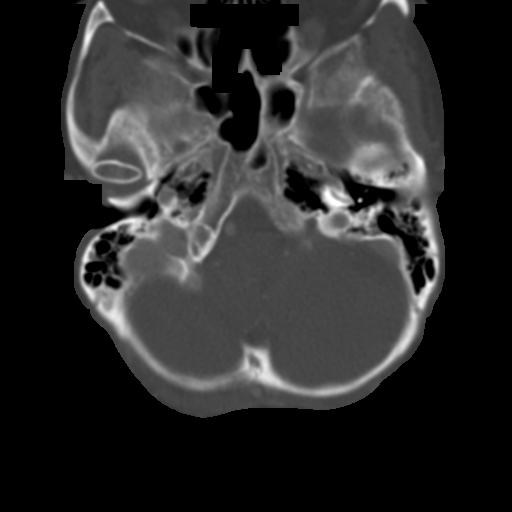

[Series 602: <mpr range> · coronal · 0.50mm/px · 3 of 73 slices shown]
[im 15/73  bone]
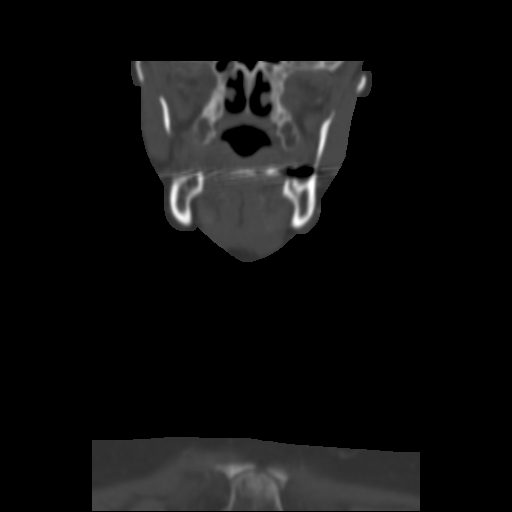
[im 29/73  bone]
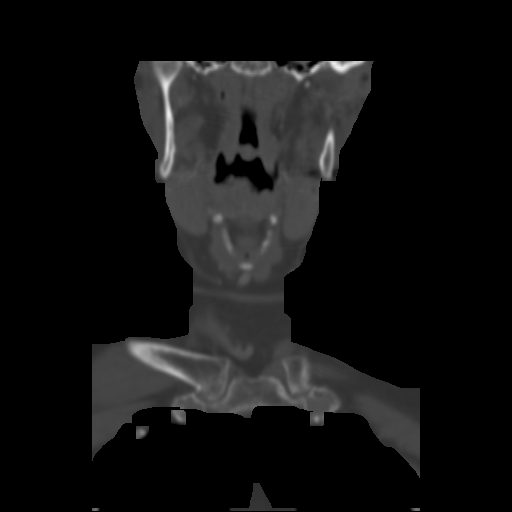
[im 44/73  bone]
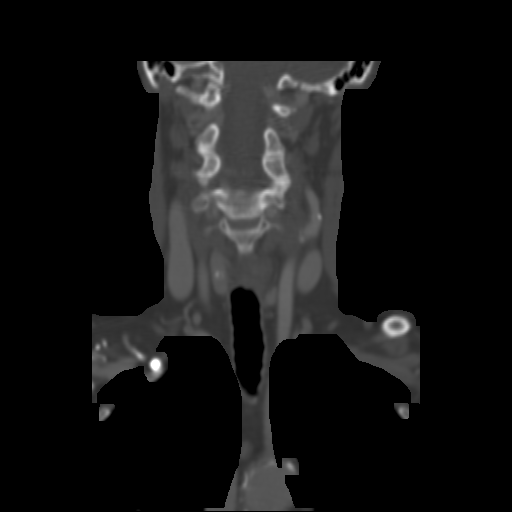

[Series 603: <mpr range(1)> · sagittal · 0.50mm/px · 5 of 74 slices shown, 6 images]
[im 25/74  bone]
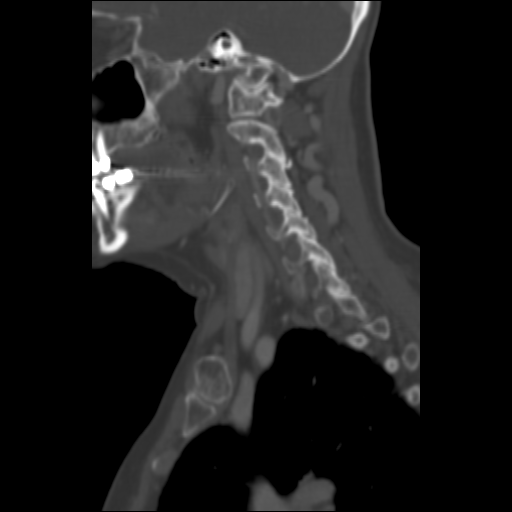
[im 31/74  bone]
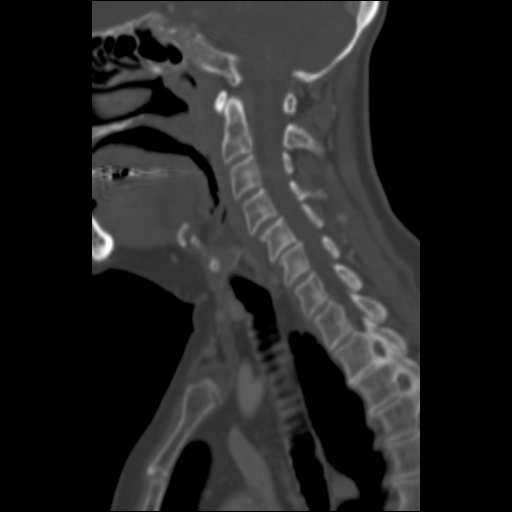
[im 37/74  soft-tissue]
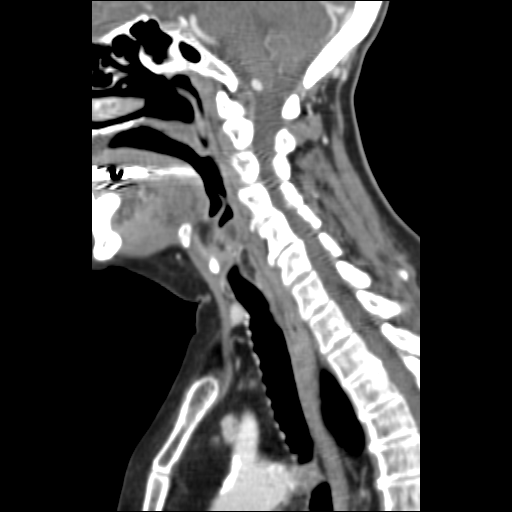
[im 37/74  bone]
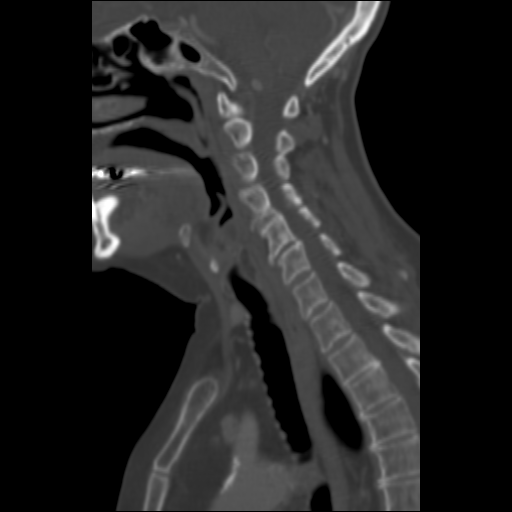
[im 43/74  bone]
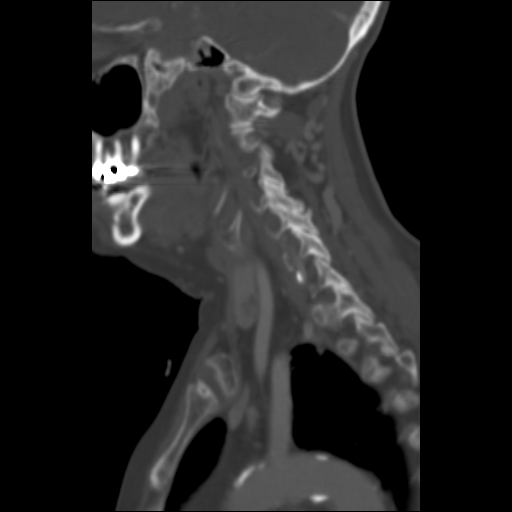
[im 49/74  bone]
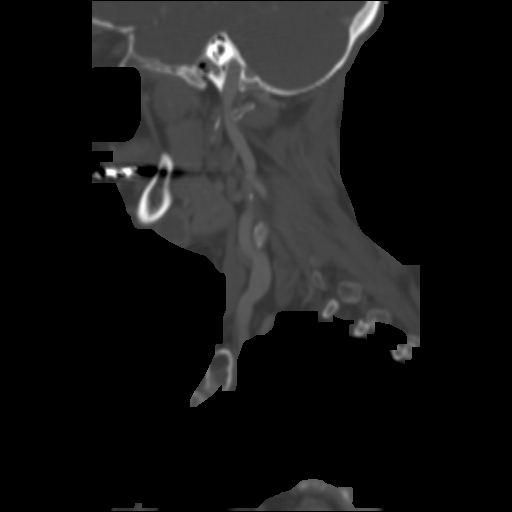

[15 of 33 positions shown; findings below may reference images not displayed]

FINDINGS: There is a cystic mass in the left submandibular space, lying just
anterior to the left submandibular gland, but separate from the
gland. The mass is of near fluid attenuation with an enhancing
mildly thickened rim. It measures 20 mm x 16 mm x 18 mm in size.
Vessels are displaced around the periphery of the mass. It does not
affect the the overlying mandible. It could potentially be
originating from the lateral floor of the mouth.

There are no other neck masses.  No adenopathy is seen.

No mass or significant asymmetry is seen along the mucosal space.
The thyroid gland demonstrates several nodules, the largest arising
from the left lobe. These have been evaluated previously with
ultrasound, a prior study dated 06/17/2008. The largest seen
currently is hypo attenuating measuring 12 mm by 6 mm by 16 mm.

Structures of the skullbase are unremarkable. Visualized portions of
the globes and orbits are normal. Lung bases show areas of scarring
as well as changes of emphysema. No mediastinal masses or adenopathy
is seen in the included field of view.

There are degenerative changes along the cervical spine. Visualized
sinuses are clear. No osteoblastic or osteolytic lesions.
IMPRESSION: 1. 2 cm cystic mass within the left submandibular space, just
anterior to, and separate from, the left submandibular gland. This
could potentially be arising from the floor of the mouth, making a
diving ranula a possible etiology. However, its appearance is more
suggestive of a low-grade cystic neoplasm or cystic degeneration of
an enlarged lymph node.
2. No mucosal lesion is seen to suggest a primary malignancy.
3. There are no other masses or evidence of adenopathy.

## 2016-04-07 ENCOUNTER — Other Ambulatory Visit: Payer: Self-pay | Admitting: Family Medicine

## 2016-04-07 ENCOUNTER — Other Ambulatory Visit (INDEPENDENT_AMBULATORY_CARE_PROVIDER_SITE_OTHER): Payer: Medicare Other

## 2016-04-07 DIAGNOSIS — R61 Generalized hyperhidrosis: Secondary | ICD-10-CM

## 2016-04-07 DIAGNOSIS — R5381 Other malaise: Secondary | ICD-10-CM

## 2016-04-07 DIAGNOSIS — R5383 Other fatigue: Secondary | ICD-10-CM

## 2016-04-07 DIAGNOSIS — R918 Other nonspecific abnormal finding of lung field: Secondary | ICD-10-CM

## 2016-04-07 LAB — CBC WITH DIFFERENTIAL/PLATELET
BASOS ABS: 0.1 10*3/uL (ref 0.0–0.1)
BASOS PCT: 0.9 % (ref 0.0–3.0)
EOS ABS: 0.1 10*3/uL (ref 0.0–0.7)
Eosinophils Relative: 0.7 % (ref 0.0–5.0)
HEMATOCRIT: 34.8 % — AB (ref 36.0–46.0)
HEMOGLOBIN: 11.4 g/dL — AB (ref 12.0–15.0)
LYMPHS PCT: 14.2 % (ref 12.0–46.0)
Lymphs Abs: 2 10*3/uL (ref 0.7–4.0)
MCHC: 32.7 g/dL (ref 30.0–36.0)
MCV: 84.7 fl (ref 78.0–100.0)
MONOS PCT: 11.5 % (ref 3.0–12.0)
Monocytes Absolute: 1.6 10*3/uL — ABNORMAL HIGH (ref 0.1–1.0)
NEUTROS ABS: 10.3 10*3/uL — AB (ref 1.4–7.7)
Neutrophils Relative %: 72.7 % (ref 43.0–77.0)
Platelets: 476 10*3/uL — ABNORMAL HIGH (ref 150.0–400.0)
RBC: 4.11 Mil/uL (ref 3.87–5.11)
RDW: 15.5 % (ref 11.5–15.5)
WBC: 14.1 10*3/uL — AB (ref 4.0–10.5)

## 2016-04-08 ENCOUNTER — Ambulatory Visit (HOSPITAL_COMMUNITY)
Admission: RE | Admit: 2016-04-08 | Discharge: 2016-04-08 | Disposition: A | Discharge status: Home / Self Care | Payer: Medicare Other | Source: Ambulatory Visit | Attending: Internal Medicine | Admitting: Internal Medicine

## 2016-04-08 DIAGNOSIS — R918 Other nonspecific abnormal finding of lung field: Secondary | ICD-10-CM | POA: Diagnosis not present

## 2016-04-08 DIAGNOSIS — J323 Chronic sphenoidal sinusitis: Secondary | ICD-10-CM | POA: Insufficient documentation

## 2016-04-08 DIAGNOSIS — R590 Localized enlarged lymph nodes: Secondary | ICD-10-CM | POA: Diagnosis not present

## 2016-04-08 DIAGNOSIS — I700 Atherosclerosis of aorta: Secondary | ICD-10-CM | POA: Diagnosis not present

## 2016-04-08 DIAGNOSIS — I2510 Atherosclerotic heart disease of native coronary artery without angina pectoris: Secondary | ICD-10-CM | POA: Insufficient documentation

## 2016-04-08 LAB — GLUCOSE, CAPILLARY: GLUCOSE-CAPILLARY: 147 mg/dL — AB (ref 65–99)

## 2016-04-08 MED ORDER — FLUDEOXYGLUCOSE F - 18 (FDG) INJECTION
5.0900 | Freq: Once | INTRAVENOUS | Status: AC | PRN
  Administered 2016-04-08: 5.09 via INTRAVENOUS

## 2016-04-09 ENCOUNTER — Telehealth: Payer: Self-pay | Admitting: Internal Medicine

## 2016-04-09 NOTE — Telephone Encounter (Signed)
Discussed in detail all the  indications, usual  risks and alternatives  relative to the benefits with patient's daughter who agrees to proceed with bronchoscopy with biopsy (pt  is a little forgetful and didn't explain the situation well p I talked directly to  Her)

## 2016-04-09 NOTE — Telephone Encounter (Signed)
Spoke with pt's daughter, Santiago Glad. She states that MW called the pt this morning about her PET scan results. Santiago Glad would like to speak to MW about these results.  MW - please advise. Thanks.

## 2016-04-11 DIAGNOSIS — J189 Pneumonia, unspecified organism: Secondary | ICD-10-CM | POA: Diagnosis not present

## 2016-04-13 ENCOUNTER — Emergency Department (HOSPITAL_COMMUNITY)
Admission: EM | Admit: 2016-04-13 | Discharge: 2016-05-09 | Disposition: E | Payer: Medicare Other | Attending: Emergency Medicine | Admitting: Emergency Medicine

## 2016-04-13 DIAGNOSIS — I252 Old myocardial infarction: Secondary | ICD-10-CM | POA: Insufficient documentation

## 2016-04-13 DIAGNOSIS — I2510 Atherosclerotic heart disease of native coronary artery without angina pectoris: Secondary | ICD-10-CM | POA: Diagnosis not present

## 2016-04-13 DIAGNOSIS — F17210 Nicotine dependence, cigarettes, uncomplicated: Secondary | ICD-10-CM | POA: Insufficient documentation

## 2016-04-13 DIAGNOSIS — Z79899 Other long term (current) drug therapy: Secondary | ICD-10-CM | POA: Insufficient documentation

## 2016-04-13 DIAGNOSIS — Z955 Presence of coronary angioplasty implant and graft: Secondary | ICD-10-CM | POA: Insufficient documentation

## 2016-04-13 DIAGNOSIS — Z7982 Long term (current) use of aspirin: Secondary | ICD-10-CM | POA: Insufficient documentation

## 2016-04-13 DIAGNOSIS — I469 Cardiac arrest, cause unspecified: Secondary | ICD-10-CM | POA: Diagnosis not present

## 2016-04-13 DIAGNOSIS — J449 Chronic obstructive pulmonary disease, unspecified: Secondary | ICD-10-CM | POA: Insufficient documentation

## 2016-04-13 DIAGNOSIS — E1151 Type 2 diabetes mellitus with diabetic peripheral angiopathy without gangrene: Secondary | ICD-10-CM | POA: Insufficient documentation

## 2016-04-14 ENCOUNTER — Telehealth: Payer: Self-pay

## 2016-04-14 LAB — CULTURE, BLOOD (SINGLE): Organism ID, Bacteria: NO GROWTH

## 2016-04-14 NOTE — Telephone Encounter (Signed)
Per snapshot pt is noted as deceased. FYI to Dr Danise Mina.

## 2016-04-14 NOTE — Telephone Encounter (Signed)
PLEASE NOTE: All timestamps contained within this report are represented as [place] Standard Time. CONFIDENTIALTY NOTICE: This fax transmission is intended only for the addressee. It contains information that is legally privileged, confidential or otherwise protected from use or disclosure. If you are not the intended recipient, you are strictly prohibited from reviewing, disclosing, copying using or disseminating any of this information or taking any action in reliance on or regarding this information. If you have received this fax in error, please notify us immediately by telephone so that we can arrange for its return to [place]. Phone: [phone], Toll-Free: [phone], Fax: [phone] Page: 1 of 1 Call Id: [id number] [place] Night - Client Nonclinical Telephone Record [place] Night - Client Client Site [place] Physician [name] - MD Contact Type Call Who Is Calling Physician / Provider / Hospital Call Type Provider Call [facility] Page Now Reason for Call Request for Physician Consult Initial Comment Caller states she is [place] w/ [facility] and needing to speak with the doctor on call. Additional Comment Patient Name Desiree Floyd Patient DOB 1932/03/[address] Provider Dr Jeneen Rinks Physician Number [phone] Facility Name [facility] Paging DoctorName Phone DateTime Result/Outcome Message Type Notes Pricilla Holm- MD [phone] 05/03/2016 6:19:23 PM Called On Call Provider - Reached Doctor Paged Pricilla Holm- MD 05/03/2016 6:20:00 PM Spoke with On Call - General Message Result Spoke with On Call, information provided and warm conference the call. Call Closed By: Delton Coombes Transaction Date/Time: May 03, 2016 6:12:30 PM (ET)

## 2016-04-16 ENCOUNTER — Encounter (HOSPITAL_COMMUNITY): Payer: Medicare Other

## 2016-04-16 ENCOUNTER — Ambulatory Visit (HOSPITAL_COMMUNITY): Admit: 2016-04-16 | Payer: Medicare Other | Admitting: Internal Medicine

## 2016-04-16 SURGERY — VIDEO BRONCHOSCOPY WITHOUT FLUORO
Anesthesia: Moderate Sedation | Laterality: Bilateral

## 2016-04-20 ENCOUNTER — Telehealth: Payer: Self-pay

## 2016-04-20 NOTE — Telephone Encounter (Signed)
Desiree Floyd pts son wants to speak with Maudie Mercury CMA; Desiree Floyd appreciates you coming to pts visitation and he knows they need to return the breathing machine but does Maudie Mercury know of anything else that needs to be returned. Donald request cb.

## 2016-04-20 NOTE — Telephone Encounter (Signed)
Spoke with son. Advised him that home O2 had already been returned and I couldn't find where any other DME equipment had been ordered. Advised that if he came across anything, it should have a sticker identifying the company and he could just call them and they will come pick it up. He verbalized understanding.

## 2016-04-21 NOTE — Telephone Encounter (Signed)
Spoke with husband, expressed my condolences. Pt requests appt on Monday to discuss dry skin - scheduled.

## 2016-04-22 ENCOUNTER — Ambulatory Visit: Payer: Medicare Other | Admitting: Family Medicine

## 2016-04-22 ENCOUNTER — Other Ambulatory Visit: Payer: Medicare Other

## 2016-04-28 ENCOUNTER — Other Ambulatory Visit: Payer: Medicare Other

## 2016-05-05 ENCOUNTER — Ambulatory Visit: Payer: Medicare Other | Admitting: Family Medicine

## 2016-05-09 NOTE — ED Provider Notes (Addendum)
[facility] DEPT Provider Note   CSN: [id number] Arrival date & time: 05/07/2016  1645  By signing my name below, I, Hansel Feinstein, attest that this documentation has been prepared under the direction and in the presence of Tanna Furry, MD. Electronically Signed: Hansel Feinstein, ED Scribe. 05-07-16. 4:56 PM.     History   Chief Complaint Chief Complaint  Patient presents with  . Cardiac Arrest   LEVEL 5 CAVEAT: HPI and ROS limited due to pt condition    HPI Desiree Floyd is a 83 y.o. female brought in by ambulance with h/o COPD, stage IV lung cancer recently dx, who presents to the Emergency Department s/p cardiac arrest with CPR in progress for 75 minutes. EMS was called after pt collapsed in front of her husband at home. Per EMS, they performed 45 minutes of CPR before the pt had a narrow brady rhythm with pulses for ~10 minutes. EMS states the pt then went into PEA, received CPR for 10 minutes before going into V. Fib and received 2 shocks by the AED. Pts last rhythm was asystole prior to arriving in the ER. Pt received 15 units Epi and 250 of amiodarone en route per EMS. Per EMS, the pt was spitting up copious amounts of red blood on their arrival and after airway insertion, they drained 75 cc of bright red blood out of the tube. CBG 246 en route.   The history is provided by the EMS personnel. No language interpreter was used.    Past Medical History:  Diagnosis Date  . Allergy   . Aortic atherosclerosis ([place])    by CXR  . Asthma   . Benign breast lumps    knows breast lumps, told scar tissue from implants  . BREAST MASS, BENIGN 03/12/2010  . CAD (coronary artery disease) 2004   s/p stent CFX [[place]]  . CAP (community acquired pneumonia) 11/10/2015  . COPD (chronic obstructive pulmonary disease) ([place]) 2010   FVC 49%, FEV1 39%, ratio 0.59, not reversible w SABA (05/2008)  . CPPD (pseudo-gout)   . Diet-controlled diabetes mellitus ([place]) since 2012   diet controlled  . DJD  (degenerative joint disease) of lumbar spine   . Exertional shortness of breath   . Gout    [place]  . Hyperlipidemia   . Hypertension   . Mass of left submandibular region 2015   s/p aspiration then excision by ENT - squamous cell ? spread from lip SqCCa rec pt CT 05/2015  . Myocardial infarction 2004   DES CFX  . OA (osteoarthritis)    of hands  . On home oxygen therapy    "4L; just at night" (11/10/2015)  . Osteoporosis 2016   T -2.7 spine, -2.6 hip  . PAC (premature atrial contraction)    chronic  . Parainfluenza virus pneumonia 11/14/2015  . Smoker    1/2 ppd  . Squamous cell cancer of lip 2014   L lower lip s/p excision  . Urge incontinence 2008   s/p urodynamics [[place]]    Patient Active Problem List   Diagnosis Date Noted  . Lung mass 03/31/2016  . Aortic stenosis, moderate 03/18/2016  . Aortic atherosclerosis ([place])   . Malaise and fatigue 02/11/2016  . Thrush, oral 02/11/2016  . Hypocalcemia 02/11/2016  . Irregular heart beat 02/11/2016  . PAD (peripheral artery disease) ([place]) 03/25/2015  . Vitamin D deficiency 10/18/2014  . Osteoporosis   . Advanced care planning/counseling discussion 04/16/2014  . Health maintenance examination 04/16/2014  .  Loss of weight 04/16/2014  . History of squamous cell carcinoma excision 09/27/2013  . COPD exacerbation ([place]) 08/21/2012  . Diabetic peripheral vascular disease ([place]) 06/15/2012  . PAC (premature atrial contraction)   . Medicare annual wellness visit, subsequent 02/24/2012  . Insomnia 03/16/2011  . CAD (coronary artery disease) 11/03/2010  . Diabetes mellitus type 2, controlled, with complications ([place]) [id number]  . HLD (hyperlipidemia) 12/16/2009  . Gout 12/16/2009  . Cigarette smoker 12/16/2009  . Essential hypertension 12/16/2009  . COPD still smoking/ spirometry uninterpretable 12/16/2009    Past Surgical History:  Procedure Laterality Date  . AUGMENTATION MAMMAPLASTY Bilateral 1970's  . BASAL  CELL CARCINOMA EXCISION Left 2014   "lower lip"  . CATARACT EXTRACTION W/ INTRAOCULAR LENS  IMPLANT, BILATERAL Bilateral 2010  . COLONOSCOPY  09/2005   tortuous colon, hem o/w WNL ([place])  . CORONARY ANGIOPLASTY WITH STENT PLACEMENT  2004   "1"   . MIDDLE EAR SURGERY Bilateral    "had one surgery on each ear for hearing loss:  prosthesis implanted in right ear; metal piece placed in left ear"  . RADICAL NECK DISSECTION Left 09/27/2013   Procedure: LEFT SUPRAHYOID DISSECTION;  Surgeon: Melissa Montane, MD - LN with metastatic SCC unknown primary  . TONSILLECTOMY  1950's    OB History    No data available       Home Medications    Prior to Admission medications   Medication Sig Start Date End Date Taking? Authorizing Provider  albuterol (PROVENTIL HFA;VENTOLIN HFA) 108 (90 Base) MCG/ACT inhaler Inhale 2 puffs into the lungs every 6 (six) hours as needed for wheezing or shortness of breath. 02/12/15   [name], MD  alendronate (FOSAMAX) 70 MG tablet TAKE 1 TABLET BY MOUTH  EVERY 7 DAYS WITH A FULL  GLASS OF WATER ON AN EMPTY  STOMACH 03/25/16   [name], MD  amLODipine (NORVASC) 5 MG tablet Take 5 mg by mouth daily.    Historical Provider, MD  aspirin 81 MG tablet Take 81 mg by mouth daily.      Historical Provider, MD  Calcium Carb-Cholecalciferol (CALCIUM-VITAMIN D) 600-400 MG-UNIT TABS Take 1 tablet by mouth daily.    Historical Provider, MD  cholecalciferol (VITAMIN D) 1000 units tablet Take 1,000 Units by mouth daily.    Historical Provider, MD  clotrimazole (MYCELEX) 10 MG troche Take 1 tablet (10 mg total) by mouth 5 (five) times daily. 03/31/16   Tanda Rockers, MD  glucose blood test strip 1 each by Other route as directed. Use to check sugar once daily and as needed. Dx: E11.8 **ONE TOUCH ULTRA** 02/11/16   [name], MD  ipratropium-albuterol (DUONEB) 0.5-2.5 (3) MG/3ML SOLN USE 1 VIAL VIA NEBULIZER  EVERY 4 HOURS FOR 48 HOURS, THEN USE EVERY 4 HOURS AS  NEEDED FOR  WHEEZING OR  SHORTNESS OF BREATH 03/25/16   [name], MD  metoprolol tartrate (LOPRESSOR) 25 MG tablet Take 1 tablet (25 mg total) by mouth 2 (two) times daily. 02/11/16   [name], MD  Omega-3 Fatty Acids (FISH OIL) 1000 MG CPDR Take 1 capsule by mouth daily.    Historical Provider, MD  simvastatin (ZOCOR) 20 MG tablet Take 1 tablet by mouth at  bedtime 10/30/15   [name], MD  Tiotropium Bromide Monohydrate (SPIRIVA RESPIMAT) 2.5 MCG/ACT AERS Inhale 2 puffs into the lungs daily. 03/31/16   Tanda Rockers, MD  vitamin B-12 (CYANOCOBALAMIN) 1000 MCG tablet Take 1,000 mcg by mouth daily.  Historical Provider, MD    Family History Family History  Problem Relation Age of Onset  . Heart disease Father 67    CAD AND HEART ATTACK  . Other Mother     old age, age 51  . Heart disease Mother     Social History Social History  Substance Use Topics  . Smoking status: Current Every Day Smoker    Packs/day: 0.75    Years: 60.00    Types: Cigarettes  . Smokeless tobacco: Never Used  . Alcohol use Yes     Comment: 11/10/2015  "maybe a glass of wine twice/yr"     Allergies   Augmentin [amoxicillin-pot clavulanate]; Chantix [varenicline]; and Lisinopril   Review of Systems Review of Systems  Unable to perform ROS: Acuity of condition     Physical Exam Updated Vital Signs BP (!) 0/0   Pulse (!) 0   Resp (!) 0   Ht 5\' 5"  (1.651 m)   Wt 115 lb (52.2 kg)   SpO2 (!) 0%   BMI 19.14 kg/m   Physical Exam Patient arrives with endotracheal tube in place.  Has bilateral breath sounds with bagging of the tube.  She is a lucas compression devices actively applying chest compressions.  She has femoral pulses with compressions.  GCS is 3 unresponsive flaccid.   ED Treatments / Results  Labs (all labs ordered are listed, but only abnormal results are displayed) Labs Reviewed - No data to display  EKG  EKG Interpretation None       Radiology No results  found.  Procedures Procedures (including critical care time)  Cardiopulmonary Resuscitation (CPR) Procedure Note  Directed/Performed by: Tanna Furry, MD  I personally directed ancillary staff and/or performed CPR in an effort to regain return of spontaneous circulation and to maintain cardiac, neuro and systemic perfusion.    CRITICAL CARE Performed by: Tanna Furry, MD  Total critical care time: 10 minutes Critical care time was exclusive of separately billable procedures and treating other patients. Critical care was necessary to treat or prevent imminent or life-threatening deterioration. Critical care was time spent personally by me on the following activities: development of treatment plan with patient and/or surrogate as well as nursing, discussions with consultants, evaluation of patient's response to treatment, examination of patient, obtaining history from patient or surrogate, ordering and performing treatments and interventions, ordering and review of laboratory studies, ordering and review of radiographic studies, pulse oximetry and re-evaluation of patient's condition.   Medications Ordered in ED Medications - No data to display   Initial Impression / Assessment and Plan / ED Course  I have reviewed the triage vital signs and the nursing notes.  Pertinent labs & imaging results that were available during my care of the patient were reviewed by me and considered in my medical decision making (see chart for details).      4:38 PM Pt actively receiving CPR.   4:45 PM No cardiac rhythm per bedside US.  Patient has had over 1 hour ACLS per out of hospital cardiac arrest with known stage IV lung cancer. Presented to husband with frank hemoptysis followed by collapse. Has had only return of spontaneous circulation for less than 1 minute after more than 15 rounds of epinephrine, defibrillation, and ongoing chest compressions. Resuscitative efforts at this point are futile. Code was  terminated by myself. Time of death 4:47 PM. Family updated.  4:47 PM Time of death declared.   5:26 PM Family of pt updated.  I placed a call the patient's primary care physician to discuss the patient's expiration. Will notify medical examiner. Will not be a medical examiner case.  I discussed with [place]. They willnotify Dr. Danise Mina of pts Cardiac Arrest and ask re: signing death certificate.  Final Clinical Impressions(s) / ED Diagnoses   Final diagnoses:  Cardiac arrest [facility])    New Prescriptions New Prescriptions   No medications on file    I personally performed the services described in this documentation, which was scribed in my presence. The recorded information has been reviewed and is accurate.    Tanna Furry, MD 05-11-2016 1759    Tanna Furry, MD 05-11-2016 2039

## 2016-05-09 NOTE — ED Notes (Signed)
Got patient on the monitor and pads doctor called time of death

## 2016-05-09 NOTE — Progress Notes (Signed)
   Met w/ family at bedside.  Offered prayer and patient placement information.  Will follow, as needed.  - Rev. [place] MDiv ThM

## 2016-05-09 NOTE — ED Triage Notes (Signed)
Pt presents to the ed with ems with cpr in progress x 75 minutes, the patient was at home and collapsed infront of her husband.  She was in asystole on fire's arrival she received 45 min of cpr and then had return of pulse in a sinus brady rhythm x 10 min, she went into pea and received cpr x 10 min and then into v. Fib and received 2 shocks, and then returned into asystole just prior to arriving to the er. She received 14 doses of epi with ems and 250 of amiodarone. She arrives with cpr still in place being bagged with an ambu bag with a king airway in place

## 2016-05-09 NOTE — ED Notes (Signed)
edp held cpr at 1645, asystole in all leads, bedside ultrasound showed no cardiac movement, the patient is not breathing on her own, time of death 77

## 2016-05-09 DEATH — deceased

## 2018-07-29 IMAGING — DX DG CHEST 2V
2 series · 2 of 2 positions shown · non-contrast
Comparison: PA and lateral chest 11/10/2015 and 10/18/2014.

CLINICAL DATA: Fever and chills yesterday.  Pneumonia.

EXAM:
CHEST  2 VIEW

[chest pa]
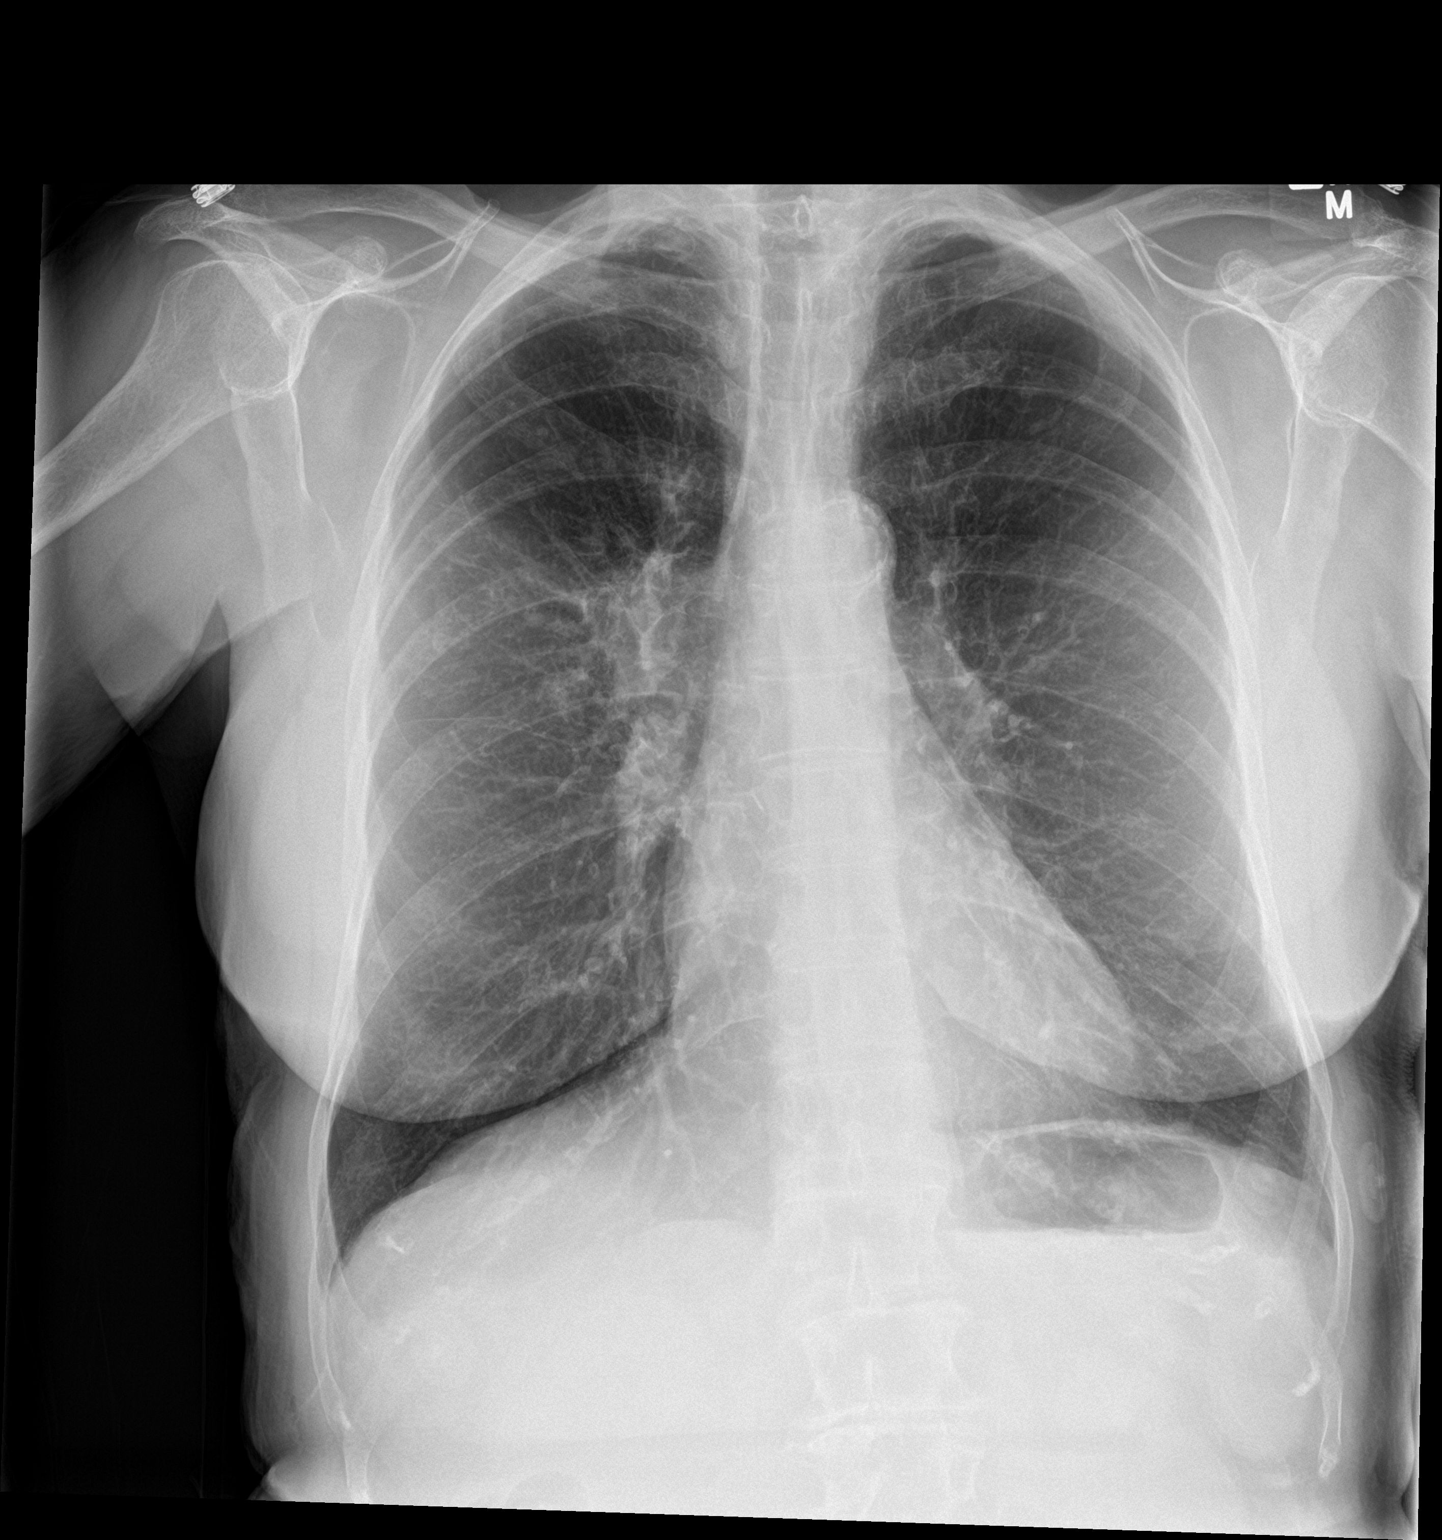

[chest lat]
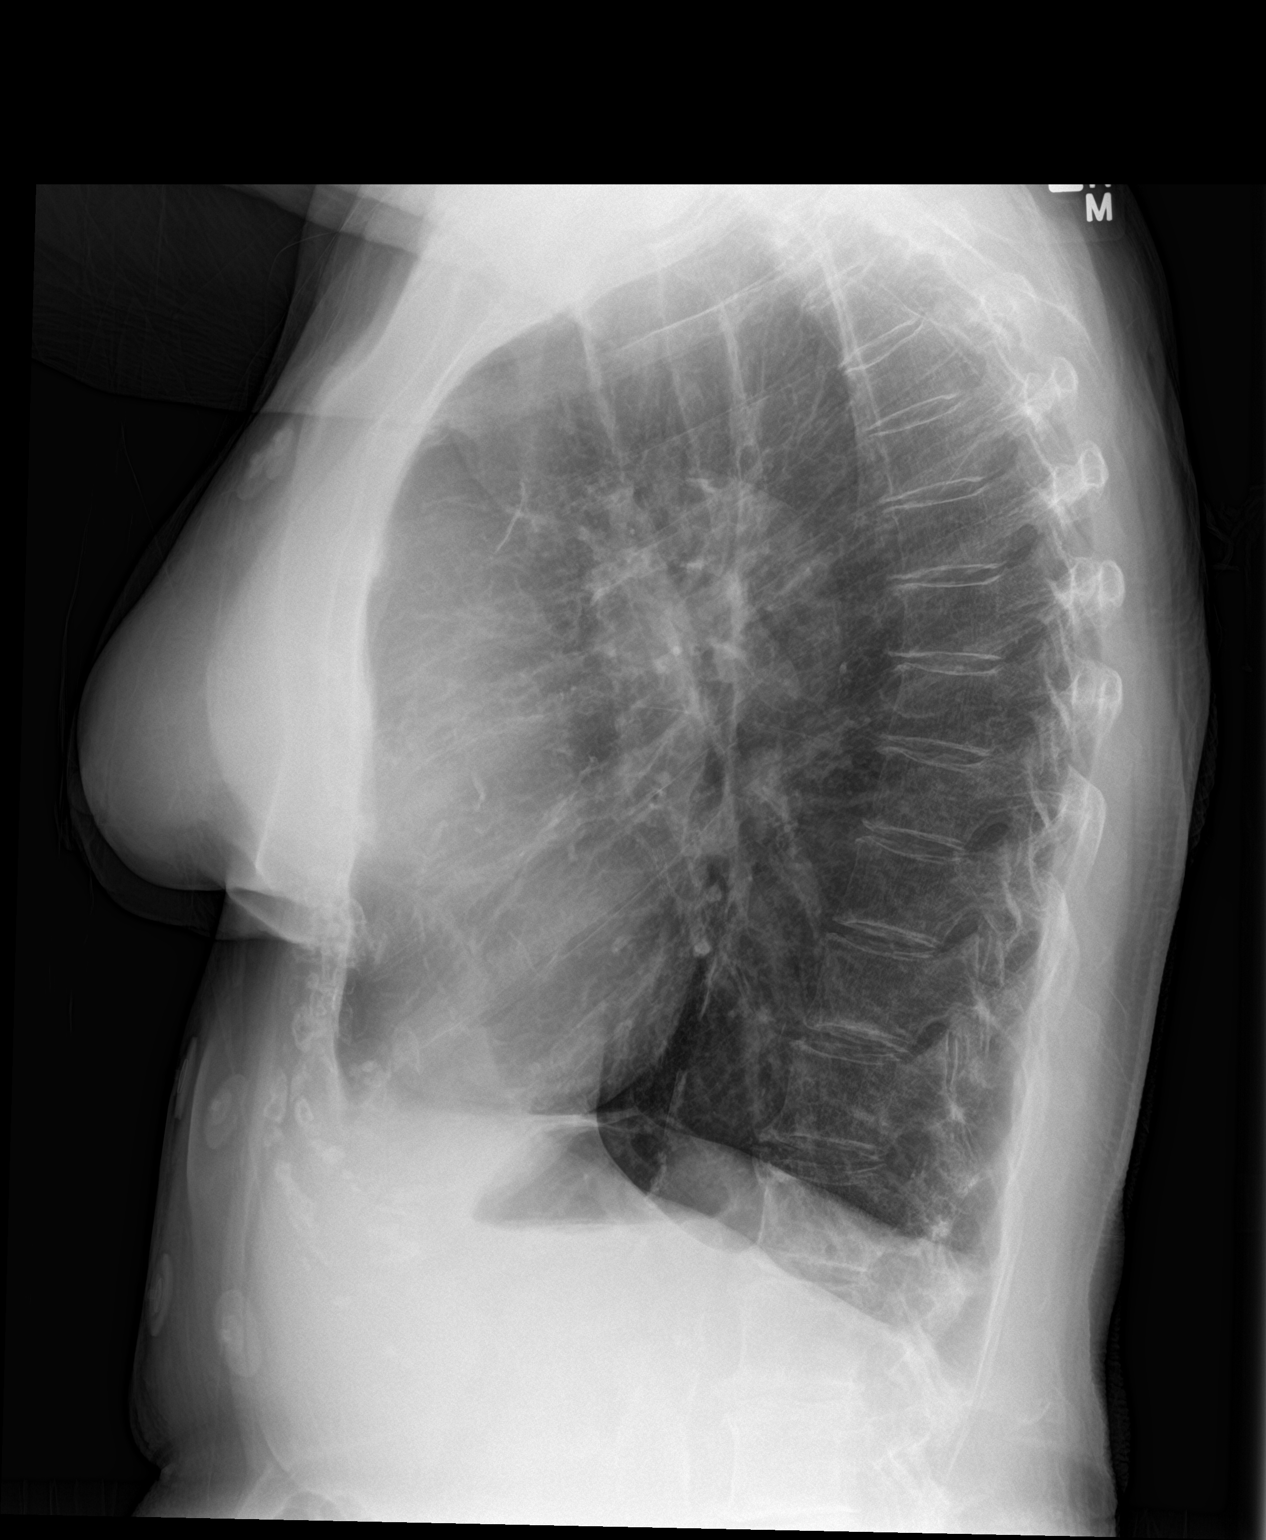

[2 of 2 positions shown; findings below may reference images not displayed]

FINDINGS: The lungs are emphysematous. Hazy airspace opacity right upper lobe
seen on yesterday's examination appears improved. No pneumothorax or
pleural effusion.
IMPRESSION: Improved hazy airspace opacity right upper lobe. No new abnormality.

Emphysema.
